# Patient Record
Sex: Male | Born: 1955 | Race: White | Hispanic: No | Smoking: Former smoker
Health system: Southern US, Community
[De-identification: ages and names within clinical notes are randomized; demographics above are authoritative.]

## PROBLEM LIST (undated history)

## (undated) DIAGNOSIS — T884XXA Failed or difficult intubation, initial encounter: Secondary | ICD-10-CM

---

## 2012-09-30 ENCOUNTER — Emergency Department: Payer: Self-pay | Admitting: Emergency Medicine

## 2012-10-01 ENCOUNTER — Emergency Department: Payer: Self-pay | Admitting: Emergency Medicine

## 2014-05-10 ENCOUNTER — Inpatient Hospital Stay (HOSPITAL_COMMUNITY)
Admission: EM | Admit: 2014-05-10 | Discharge: 2014-05-19 | DRG: 853 | Disposition: A | Discharge status: Home Health | Payer: Medicaid Other | Attending: Internal Medicine | Admitting: Internal Medicine

## 2014-05-10 ENCOUNTER — Ambulatory Visit (HOSPITAL_COMMUNITY): Admit: 2014-05-10 | Payer: Self-pay | Admitting: Cardiovascular Disease

## 2014-05-10 ENCOUNTER — Inpatient Hospital Stay (HOSPITAL_COMMUNITY): Payer: Medicaid Other

## 2014-05-10 ENCOUNTER — Emergency Department (HOSPITAL_COMMUNITY): Payer: Medicaid Other

## 2014-05-10 ENCOUNTER — Encounter (HOSPITAL_COMMUNITY): Payer: Self-pay | Admitting: Emergency Medicine

## 2014-05-10 ENCOUNTER — Encounter (HOSPITAL_COMMUNITY): Payer: Self-pay

## 2014-05-10 ENCOUNTER — Emergency Department (HOSPITAL_COMMUNITY): Admit: 2014-05-10 | Payer: Self-pay | Admitting: Cardiology

## 2014-05-10 DIAGNOSIS — E46 Unspecified protein-calorie malnutrition: Secondary | ICD-10-CM | POA: Diagnosis present

## 2014-05-10 DIAGNOSIS — R001 Bradycardia, unspecified: Secondary | ICD-10-CM | POA: Diagnosis present

## 2014-05-10 DIAGNOSIS — A419 Sepsis, unspecified organism: Principal | ICD-10-CM

## 2014-05-10 DIAGNOSIS — R7989 Other specified abnormal findings of blood chemistry: Secondary | ICD-10-CM

## 2014-05-10 DIAGNOSIS — R911 Solitary pulmonary nodule: Secondary | ICD-10-CM | POA: Diagnosis present

## 2014-05-10 DIAGNOSIS — F419 Anxiety disorder, unspecified: Secondary | ICD-10-CM | POA: Diagnosis present

## 2014-05-10 DIAGNOSIS — T380X5A Adverse effect of glucocorticoids and synthetic analogues, initial encounter: Secondary | ICD-10-CM | POA: Diagnosis present

## 2014-05-10 DIAGNOSIS — J690 Pneumonitis due to inhalation of food and vomit: Secondary | ICD-10-CM | POA: Diagnosis present

## 2014-05-10 DIAGNOSIS — R6521 Severe sepsis with septic shock: Secondary | ICD-10-CM | POA: Diagnosis present

## 2014-05-10 DIAGNOSIS — G4733 Obstructive sleep apnea (adult) (pediatric): Secondary | ICD-10-CM | POA: Diagnosis present

## 2014-05-10 DIAGNOSIS — C329 Malignant neoplasm of larynx, unspecified: Secondary | ICD-10-CM

## 2014-05-10 DIAGNOSIS — R778 Other specified abnormalities of plasma proteins: Secondary | ICD-10-CM | POA: Diagnosis present

## 2014-05-10 DIAGNOSIS — R40244 Other coma, without documented Glasgow coma scale score, or with partial score reported, unspecified time: Secondary | ICD-10-CM

## 2014-05-10 DIAGNOSIS — C760 Malignant neoplasm of head, face and neck: Secondary | ICD-10-CM

## 2014-05-10 DIAGNOSIS — I10 Essential (primary) hypertension: Secondary | ICD-10-CM | POA: Diagnosis present

## 2014-05-10 DIAGNOSIS — I639 Cerebral infarction, unspecified: Secondary | ICD-10-CM | POA: Diagnosis present

## 2014-05-10 DIAGNOSIS — R4189 Other symptoms and signs involving cognitive functions and awareness: Secondary | ICD-10-CM

## 2014-05-10 DIAGNOSIS — F1290 Cannabis use, unspecified, uncomplicated: Secondary | ICD-10-CM | POA: Diagnosis present

## 2014-05-10 DIAGNOSIS — E872 Acidosis, unspecified: Secondary | ICD-10-CM

## 2014-05-10 DIAGNOSIS — R339 Retention of urine, unspecified: Secondary | ICD-10-CM | POA: Diagnosis present

## 2014-05-10 DIAGNOSIS — R490 Dysphonia: Secondary | ICD-10-CM | POA: Diagnosis present

## 2014-05-10 DIAGNOSIS — I471 Supraventricular tachycardia: Secondary | ICD-10-CM | POA: Diagnosis present

## 2014-05-10 DIAGNOSIS — J441 Chronic obstructive pulmonary disease with (acute) exacerbation: Secondary | ICD-10-CM | POA: Diagnosis present

## 2014-05-10 DIAGNOSIS — G931 Anoxic brain damage, not elsewhere classified: Secondary | ICD-10-CM | POA: Diagnosis present

## 2014-05-10 DIAGNOSIS — C4442 Squamous cell carcinoma of skin of scalp and neck: Secondary | ICD-10-CM | POA: Diagnosis present

## 2014-05-10 DIAGNOSIS — I480 Paroxysmal atrial fibrillation: Secondary | ICD-10-CM

## 2014-05-10 DIAGNOSIS — G9340 Encephalopathy, unspecified: Secondary | ICD-10-CM

## 2014-05-10 DIAGNOSIS — R451 Restlessness and agitation: Secondary | ICD-10-CM | POA: Diagnosis present

## 2014-05-10 DIAGNOSIS — N179 Acute kidney failure, unspecified: Secondary | ICD-10-CM | POA: Diagnosis present

## 2014-05-10 DIAGNOSIS — C321 Malignant neoplasm of supraglottis: Secondary | ICD-10-CM

## 2014-05-10 DIAGNOSIS — R1313 Dysphagia, pharyngeal phase: Secondary | ICD-10-CM | POA: Diagnosis present

## 2014-05-10 DIAGNOSIS — J9602 Acute respiratory failure with hypercapnia: Secondary | ICD-10-CM

## 2014-05-10 DIAGNOSIS — R4182 Altered mental status, unspecified: Secondary | ICD-10-CM | POA: Diagnosis present

## 2014-05-10 DIAGNOSIS — R739 Hyperglycemia, unspecified: Secondary | ICD-10-CM | POA: Diagnosis present

## 2014-05-10 DIAGNOSIS — F17210 Nicotine dependence, cigarettes, uncomplicated: Secondary | ICD-10-CM | POA: Diagnosis present

## 2014-05-10 DIAGNOSIS — I4891 Unspecified atrial fibrillation: Secondary | ICD-10-CM | POA: Diagnosis present

## 2014-05-10 DIAGNOSIS — I2510 Atherosclerotic heart disease of native coronary artery without angina pectoris: Secondary | ICD-10-CM | POA: Diagnosis present

## 2014-05-10 DIAGNOSIS — I248 Other forms of acute ischemic heart disease: Secondary | ICD-10-CM | POA: Diagnosis present

## 2014-05-10 DIAGNOSIS — R40243 Glasgow coma scale score 3-8, unspecified time: Secondary | ICD-10-CM

## 2014-05-10 DIAGNOSIS — J384 Edema of larynx: Secondary | ICD-10-CM

## 2014-05-10 DIAGNOSIS — T884XXA Failed or difficult intubation, initial encounter: Secondary | ICD-10-CM

## 2014-05-10 DIAGNOSIS — R55 Syncope and collapse: Secondary | ICD-10-CM

## 2014-05-10 DIAGNOSIS — J9600 Acute respiratory failure, unspecified whether with hypoxia or hypercapnia: Secondary | ICD-10-CM | POA: Diagnosis present

## 2014-05-10 HISTORY — DX: Failed or difficult intubation, initial encounter: T88.4XXA

## 2014-05-10 LAB — BASIC METABOLIC PANEL
ANION GAP: 14 (ref 5–15)
Anion gap: 18 — ABNORMAL HIGH (ref 5–15)
BUN: 31 mg/dL — AB (ref 6–23)
BUN: 28 mg/dL — ABNORMAL HIGH (ref 6–23)
CHLORIDE: 95 meq/L — AB (ref 96–112)
CO2: 30 mEq/L (ref 19–32)
CO2: 25 mEq/L (ref 19–32)
Calcium: 10 mg/dL (ref 8.4–10.5)
Calcium: 8.2 mg/dL — ABNORMAL LOW (ref 8.4–10.5)
Chloride: 108 mEq/L (ref 96–112)
Creatinine, Ser: 1.24 mg/dL (ref 0.50–1.35)
Creatinine, Ser: 1.07 mg/dL (ref 0.50–1.35)
GFR calc Af Amer: 73 mL/min — ABNORMAL LOW (ref >90)
GFR calc non Af Amer: 63 mL/min — ABNORMAL LOW (ref >90)
GFR, EST AFRICAN AMERICAN: 88 mL/min — AB (ref >90)
GFR, EST NON AFRICAN AMERICAN: 76 mL/min — AB (ref >90)
GLUCOSE: 190 mg/dL — AB (ref 70–99)
Glucose, Bld: 87 mg/dL (ref 70–99)
POTASSIUM: 4.7 meq/L (ref 3.7–5.3)
POTASSIUM: 3.6 meq/L — AB (ref 3.7–5.3)
SODIUM: 143 meq/L (ref 137–147)
SODIUM: 147 meq/L (ref 137–147)

## 2014-05-10 LAB — COMPREHENSIVE METABOLIC PANEL
ALT: 31 U/L (ref 0–53)
ANION GAP: 11 (ref 5–15)
AST: 64 U/L — ABNORMAL HIGH (ref 0–37)
Albumin: 2.5 g/dL — ABNORMAL LOW (ref 3.5–5.2)
Alkaline Phosphatase: 72 U/L (ref 39–117)
BILIRUBIN TOTAL: 0.3 mg/dL (ref 0.3–1.2)
BUN: 33 mg/dL — AB (ref 6–23)
CALCIUM: 7.8 mg/dL — AB (ref 8.4–10.5)
CHLORIDE: 106 meq/L (ref 96–112)
CO2: 28 mEq/L (ref 19–32)
CREATININE: 1.23 mg/dL (ref 0.50–1.35)
GFR calc Af Amer: 74 mL/min — ABNORMAL LOW (ref >90)
GFR, EST NON AFRICAN AMERICAN: 64 mL/min — AB (ref >90)
Glucose, Bld: 80 mg/dL (ref 70–99)
Potassium: 4.3 mEq/L (ref 3.7–5.3)
Sodium: 145 mEq/L (ref 137–147)
Total Protein: 5.9 g/dL — ABNORMAL LOW (ref 6.0–8.3)

## 2014-05-10 LAB — I-STAT ARTERIAL BLOOD GAS, ED
Acid-Base Excess: 1 mmol/L (ref 0.0–2.0)
Bicarbonate: 29.8 mEq/L — ABNORMAL HIGH (ref 20.0–24.0)
O2 SAT: 99 %
PO2 ART: 156 mmHg — AB (ref 80.0–100.0)
Patient temperature: 98.6
TCO2: 32 mmol/L (ref 0–100)
pCO2 arterial: 66.9 mmHg (ref 35.0–45.0)
pH, Arterial: 7.256 — ABNORMAL LOW (ref 7.350–7.450)

## 2014-05-10 LAB — TROPONIN I
TROPONIN I: 5.51 ng/mL — AB (ref <0.30)
Troponin I: 0.56 ng/mL (ref <0.30)
Troponin I: 2.41 ng/mL (ref <0.30)
Troponin I: 7.08 ng/mL (ref <0.30)

## 2014-05-10 LAB — BLOOD GAS, ARTERIAL
Acid-base deficit: 1.4 mmol/L (ref 0.0–2.0)
Bicarbonate: 27.4 mEq/L — ABNORMAL HIGH (ref 20.0–24.0)
DRAWN BY: 313941
FIO2: 1 %
LHR: 16 {breaths}/min
MECHVT: 490 mL
O2 Saturation: 99.5 %
PCO2 ART: 91.7 mmHg — AB (ref 35.0–45.0)
PEEP: 5 cmH2O
PO2 ART: 299 mmHg — AB (ref 80.0–100.0)
Patient temperature: 98.6
TCO2: 30.2 mmol/L (ref 0–100)
pH, Arterial: 7.103 — CL (ref 7.350–7.450)

## 2014-05-10 LAB — CBC WITH DIFFERENTIAL/PLATELET
BASOS ABS: 0 10*3/uL (ref 0.0–0.1)
Basophils Absolute: 0 10*3/uL (ref 0.0–0.1)
Basophils Relative: 0 % (ref 0–1)
Basophils Relative: 0 % (ref 0–1)
EOS ABS: 0.1 10*3/uL (ref 0.0–0.7)
EOS PCT: 0 % (ref 0–5)
Eosinophils Absolute: 0 10*3/uL (ref 0.0–0.7)
Eosinophils Relative: 1 % (ref 0–5)
HCT: 48.4 % (ref 39.0–52.0)
HEMATOCRIT: 38.1 % — AB (ref 39.0–52.0)
HEMOGLOBIN: 14.3 g/dL (ref 13.0–17.0)
HEMOGLOBIN: 11.2 g/dL — AB (ref 13.0–17.0)
LYMPHS PCT: 9 % — AB (ref 12–46)
Lymphocytes Relative: 2 % — ABNORMAL LOW (ref 12–46)
Lymphs Abs: 1.2 10*3/uL (ref 0.7–4.0)
Lymphs Abs: 0.4 10*3/uL — ABNORMAL LOW (ref 0.7–4.0)
MCH: 24.7 pg — AB (ref 26.0–34.0)
MCH: 24 pg — ABNORMAL LOW (ref 26.0–34.0)
MCHC: 29.5 g/dL — ABNORMAL LOW (ref 30.0–36.0)
MCHC: 29.4 g/dL — ABNORMAL LOW (ref 30.0–36.0)
MCV: 83.7 fL (ref 78.0–100.0)
MCV: 81.8 fL (ref 78.0–100.0)
MONO ABS: 1.3 10*3/uL — AB (ref 0.1–1.0)
MONOS PCT: 4 % (ref 3–12)
MONOS PCT: 9 % (ref 3–12)
Monocytes Absolute: 0.6 10*3/uL (ref 0.1–1.0)
NEUTROS ABS: 13.3 10*3/uL — AB (ref 1.7–7.7)
NEUTROS PCT: 86 % — AB (ref 43–77)
Neutro Abs: 11.2 10*3/uL — ABNORMAL HIGH (ref 1.7–7.7)
Neutrophils Relative %: 89 % — ABNORMAL HIGH (ref 43–77)
PLATELETS: 280 10*3/uL (ref 150–400)
Platelets: 183 10*3/uL (ref 150–400)
RBC: 5.78 MIL/uL (ref 4.22–5.81)
RBC: 4.66 MIL/uL (ref 4.22–5.81)
RDW: 18.1 % — ABNORMAL HIGH (ref 11.5–15.5)
RDW: 17.9 % — AB (ref 11.5–15.5)
WBC: 13 10*3/uL — AB (ref 4.0–10.5)
WBC: 14.9 10*3/uL — ABNORMAL HIGH (ref 4.0–10.5)

## 2014-05-10 LAB — MAGNESIUM
MAGNESIUM: 1.5 mg/dL (ref 1.5–2.5)
Magnesium: 1.5 mg/dL (ref 1.5–2.5)

## 2014-05-10 LAB — RAPID URINE DRUG SCREEN, HOSP PERFORMED
Amphetamines: NOT DETECTED
BENZODIAZEPINES: NOT DETECTED
Barbiturates: NOT DETECTED
Cocaine: NOT DETECTED
Opiates: NOT DETECTED
Tetrahydrocannabinol: POSITIVE — AB

## 2014-05-10 LAB — URINALYSIS, ROUTINE W REFLEX MICROSCOPIC
Glucose, UA: NEGATIVE mg/dL
Ketones, ur: 15 mg/dL — AB
LEUKOCYTES UA: NEGATIVE
Nitrite: NEGATIVE
PH: 6 (ref 5.0–8.0)
Protein, ur: 100 mg/dL — AB
SPECIFIC GRAVITY, URINE: 1.025 (ref 1.005–1.030)
UROBILINOGEN UA: 1 mg/dL (ref 0.0–1.0)

## 2014-05-10 LAB — CK TOTAL AND CKMB (NOT AT ARMC)
CK TOTAL: 686 U/L — AB (ref 7–232)
CK, MB: 19.6 ng/mL (ref 0.3–4.0)
Relative Index: 2.9 — ABNORMAL HIGH (ref 0.0–2.5)

## 2014-05-10 LAB — GLUCOSE, CAPILLARY
GLUCOSE-CAPILLARY: 73 mg/dL (ref 70–99)
GLUCOSE-CAPILLARY: 89 mg/dL (ref 70–99)
Glucose-Capillary: 84 mg/dL (ref 70–99)

## 2014-05-10 LAB — URINE MICROSCOPIC-ADD ON

## 2014-05-10 LAB — HEMOGLOBIN A1C
Hgb A1c MFr Bld: 5.8 % — ABNORMAL HIGH (ref <5.7)
Mean Plasma Glucose: 120 mg/dL — ABNORMAL HIGH (ref <117)

## 2014-05-10 LAB — CORTISOL: Cortisol, Plasma: 30.1 ug/dL

## 2014-05-10 LAB — SALICYLATE LEVEL: Salicylate Lvl: 10.9 mg/dL (ref 2.8–20.0)

## 2014-05-10 LAB — ACETAMINOPHEN LEVEL

## 2014-05-10 LAB — MRSA PCR SCREENING: MRSA by PCR: NEGATIVE

## 2014-05-10 LAB — AMMONIA: Ammonia: 25 umol/L (ref 11–60)

## 2014-05-10 LAB — APTT: APTT: 27 s (ref 24–37)

## 2014-05-10 LAB — PROTIME-INR
INR: 1.37 (ref 0.00–1.49)
PROTHROMBIN TIME: 16.9 s — AB (ref 11.6–15.2)

## 2014-05-10 LAB — LACTIC ACID, PLASMA: LACTIC ACID, VENOUS: 5.6 mmol/L — AB (ref 0.5–2.2)

## 2014-05-10 LAB — PRO B NATRIURETIC PEPTIDE: Pro B Natriuretic peptide (BNP): 1793 pg/mL — ABNORMAL HIGH (ref 0–125)

## 2014-05-10 LAB — ETHANOL: Alcohol, Ethyl (B): <11 mg/dL (ref 0–11)

## 2014-05-10 LAB — PHOSPHORUS: Phosphorus: 2.1 mg/dL — ABNORMAL LOW (ref 2.3–4.6)

## 2014-05-10 LAB — POTASSIUM: Potassium: 4.1 mEq/L (ref 3.7–5.3)

## 2014-05-10 LAB — CK: CK TOTAL: 371 U/L — AB (ref 7–232)

## 2014-05-10 SURGERY — LEFT HEART CATHETERIZATION WITH CORONARY ANGIOGRAM
Anesthesia: LOCAL

## 2014-05-10 MED ORDER — ROCURONIUM BROMIDE 50 MG/5ML IV SOLN
INTRAVENOUS | Status: AC
  Filled 2014-05-10: qty 2

## 2014-05-10 MED ORDER — ETOMIDATE 2 MG/ML IV SOLN
INTRAVENOUS | Status: AC
  Administered 2014-05-10: 30 mg
  Filled 2014-05-10: qty 20

## 2014-05-10 MED ORDER — INSULIN ASPART 100 UNIT/ML ~~LOC~~ SOLN
0.0000 [IU] | SUBCUTANEOUS | Status: DC
  Administered 2014-05-11 – 2014-05-18 (×4): 2 [IU] via SUBCUTANEOUS

## 2014-05-10 MED ORDER — SODIUM CHLORIDE 0.9 % IV BOLUS (SEPSIS)
2000.0000 mL | Freq: Once | INTRAVENOUS | Status: AC
  Administered 2014-05-10: 2000 mL via INTRAVENOUS

## 2014-05-10 MED ORDER — HYDROCORTISONE NA SUCCINATE PF 100 MG IJ SOLR
50.0000 mg | Freq: Four times a day (QID) | INTRAMUSCULAR | Status: DC
  Administered 2014-05-10 – 2014-05-11 (×4): 50 mg via INTRAVENOUS
  Filled 2014-05-10 (×8): qty 1

## 2014-05-10 MED ORDER — VANCOMYCIN HCL 10 G IV SOLR
1500.0000 mg | Freq: Once | INTRAVENOUS | Status: AC
  Administered 2014-05-10: 1500 mg via INTRAVENOUS
  Filled 2014-05-10: qty 1500

## 2014-05-10 MED ORDER — LEVETIRACETAM IN NACL 500 MG/100ML IV SOLN
500.0000 mg | Freq: Two times a day (BID) | INTRAVENOUS | Status: DC
  Administered 2014-05-11 – 2014-05-12 (×4): 500 mg via INTRAVENOUS
  Filled 2014-05-10 (×6): qty 100

## 2014-05-10 MED ORDER — NOREPINEPHRINE BITARTRATE 1 MG/ML IV SOLN: 2.0000 ug/min | INTRAVENOUS | Status: DC

## 2014-05-10 MED ORDER — HEPARIN SODIUM (PORCINE) 5000 UNIT/ML IJ SOLN
5000.0000 [IU] | Freq: Three times a day (TID) | INTRAMUSCULAR | Status: DC
  Administered 2014-05-10 – 2014-05-15 (×10): 5000 [IU] via SUBCUTANEOUS
  Filled 2014-05-10 (×23): qty 1

## 2014-05-10 MED ORDER — IOHEXOL 350 MG/ML SOLN
50.0000 mL | Freq: Once | INTRAVENOUS | Status: AC | PRN
  Administered 2014-05-10: 50 mL via INTRAVENOUS

## 2014-05-10 MED ORDER — PIPERACILLIN-TAZOBACTAM 3.375 G IVPB
3.3750 g | Freq: Three times a day (TID) | INTRAVENOUS | Status: DC
  Administered 2014-05-10 – 2014-05-12 (×6): 3.375 g via INTRAVENOUS
  Filled 2014-05-10 (×7): qty 50

## 2014-05-10 MED ORDER — LIDOCAINE HCL (CARDIAC) 20 MG/ML IV SOLN
INTRAVENOUS | Status: AC
  Filled 2014-05-10: qty 5

## 2014-05-10 MED ORDER — PANTOPRAZOLE SODIUM 40 MG IV SOLR
40.0000 mg | INTRAVENOUS | Status: DC
  Administered 2014-05-11 – 2014-05-12 (×2): 40 mg via INTRAVENOUS
  Filled 2014-05-10 (×4): qty 40

## 2014-05-10 MED ORDER — LORAZEPAM 2 MG/ML IJ SOLN
2.0000 mg | Freq: Once | INTRAMUSCULAR | Status: AC
  Administered 2014-05-10: 2 mg via INTRAVENOUS
  Filled 2014-05-10: qty 1

## 2014-05-10 MED ORDER — PIPERACILLIN-TAZOBACTAM 3.375 G IVPB 30 MIN: 3.3750 g | Freq: Once | INTRAVENOUS | Status: DC

## 2014-05-10 MED ORDER — INFLUENZA VAC SPLIT QUAD 0.5 ML IM SUSY
0.5000 mL | PREFILLED_SYRINGE | INTRAMUSCULAR | Status: DC
  Filled 2014-05-10 (×2): qty 0.5

## 2014-05-10 MED ORDER — SODIUM CHLORIDE 0.9 % IV SOLN: INTRAVENOUS | Status: DC

## 2014-05-10 MED ORDER — CETYLPYRIDINIUM CHLORIDE 0.05 % MT LIQD
7.0000 mL | Freq: Four times a day (QID) | OROMUCOSAL | Status: DC
  Administered 2014-05-10 – 2014-05-19 (×25): 7 mL via OROMUCOSAL

## 2014-05-10 MED ORDER — CHLORHEXIDINE GLUCONATE 0.12 % MT SOLN
15.0000 mL | Freq: Two times a day (BID) | OROMUCOSAL | Status: DC
  Administered 2014-05-10 – 2014-05-19 (×18): 15 mL via OROMUCOSAL
  Filled 2014-05-10 (×20): qty 15

## 2014-05-10 MED ORDER — SODIUM CHLORIDE 0.9 % IV SOLN
50.0000 ug/h | INTRAVENOUS | Status: DC
  Administered 2014-05-10: 50 ug/h via INTRAVENOUS
  Filled 2014-05-10: qty 50

## 2014-05-10 MED ORDER — IPRATROPIUM-ALBUTEROL 0.5-2.5 (3) MG/3ML IN SOLN
3.0000 mL | RESPIRATORY_TRACT | Status: DC
  Administered 2014-05-10 – 2014-05-12 (×11): 3 mL via RESPIRATORY_TRACT
  Filled 2014-05-10 (×12): qty 3

## 2014-05-10 MED ORDER — VANCOMYCIN HCL IN DEXTROSE 1-5 GM/200ML-% IV SOLN
1000.0000 mg | Freq: Two times a day (BID) | INTRAVENOUS | Status: DC
  Administered 2014-05-11 – 2014-05-12 (×4): 1000 mg via INTRAVENOUS
  Filled 2014-05-10 (×4): qty 200

## 2014-05-10 MED ORDER — SUCCINYLCHOLINE CHLORIDE 20 MG/ML IJ SOLN
INTRAMUSCULAR | Status: AC
  Administered 2014-05-10: 120 mg
  Filled 2014-05-10: qty 1

## 2014-05-10 MED ORDER — IPRATROPIUM-ALBUTEROL 20-100 MCG/ACT IN AERS: 6.0000 | INHALATION_SPRAY | RESPIRATORY_TRACT | Status: DC

## 2014-05-10 MED ORDER — PIPERACILLIN-TAZOBACTAM IN DEX 2-0.25 GM/50ML IV SOLN: 2.2500 g | Freq: Once | INTRAVENOUS | Status: DC

## 2014-05-10 MED ORDER — SODIUM CHLORIDE 0.9 % IV BOLUS (SEPSIS)
1000.0000 mL | Freq: Once | INTRAVENOUS | Status: AC
  Administered 2014-05-10: 1000 mL via INTRAVENOUS

## 2014-05-10 MED ORDER — NOREPINEPHRINE BITARTRATE 1 MG/ML IV SOLN
2.0000 ug/min | INTRAVENOUS | Status: DC
  Filled 2014-05-10: qty 4

## 2014-05-10 MED ORDER — IOHEXOL 350 MG/ML SOLN
100.0000 mL | Freq: Once | INTRAVENOUS | Status: AC | PRN
  Administered 2014-05-10: 80 mL via INTRAVENOUS

## 2014-05-10 MED ORDER — LEVETIRACETAM IN NACL 1000 MG/100ML IV SOLN
1000.0000 mg | Freq: Once | INTRAVENOUS | Status: AC
  Administered 2014-05-10: 1000 mg via INTRAVENOUS
  Filled 2014-05-10: qty 100

## 2014-05-10 NOTE — ED Notes (Signed)
Pt's wallet given to brother, Antionette Poles

## 2014-05-10 NOTE — Procedures (Signed)
Central Venous Catheter Insertion Procedure Note Donald Hughes [id number] April 03, 1956  Procedure: Insertion of Central Venous Catheter Indications: Assessment of intravascular volume and Drug and/or fluid administration  Procedure Details Consent: Unable to obtain consent because of emergent medical necessity. Time Out: Verified patient identification, verified procedure, site/side was marked, verified correct patient position, special equipment/implants available, medications/allergies/relevent history reviewed, required imaging and test results available.  Performed  Maximum sterile technique was used including antiseptics, cap, gloves, gown, hand hygiene, mask and sheet. Skin prep: Chlorhexidine; local anesthetic administered A antimicrobial bonded/coated triple lumen catheter was placed in the left internal jugular vein using the Seldinger technique.  Evaluation Blood flow good Complications: No apparent complications Patient did tolerate procedure well. Chest X-ray ordered to verify placement.  CXR: normal.  U/S used in placement.  YACOUB,WESAM 05/10/2014, 11:09 AM

## 2014-05-10 NOTE — Consult Note (Addendum)
CARDIOLOGY CONSULT NOTE   Patient ID: Donald Hughes MRN: [id number] DOB/AGE: 02-15-1956 58 y.o.  Admit Date: 05/10/2014 Primary Physician: No primary provider on file. Primary Cardiologist    Rodney Langton Reason for Consultation:   Clinical Summary Donald Hughes is a 58 y.o.male. He appeared to be stable when seen by roommate approximately 1 AM. At 6 AM he was found unresponsive. When EMS arrived he had bradycardia and decreased respirations. His airway was placed. As he arrived in the emergency room he had rapid atrial fibrillation. The EKGs were reviewed immediately by the interventional team (Dr. Burt Knack). He felt that the presentation was not consistent with an acute ST elevation MI. There was an initial EKG suggesting very slight ST elevation. EKGs in the emergency room first showed rapid atrial fibrillation with diffuse ST depression. He then converted to sinus rhythm. A followup EKG in sinus rhythm shows mild diffuse ST depression. In the emergency room the patient is assessed by the critical care team and by neurology. He was noted to be acidemic and markedly hypercarbic upon arrival. Etiology remains unclear.  There are no prior records about this patient. There is no information about any possible prior cardiac history.   Allergies not on file  Medications Scheduled Medications: . heparin subcutaneous  5,000 Units Subcutaneous 3 times per day  . hydrocortisone sodium succinate  50 mg Intravenous Q6H  . insulin aspart  0-15 Units Subcutaneous 6 times per day  . ipratropium-albuterol  3 mL Nebulization Q4H  . lidocaine (cardiac) 100 mg/11ml      . pantoprazole (PROTONIX) IV  40 mg Intravenous Q24H  . rocuronium         Infusions: . sodium chloride    . fentaNYL infusion INTRAVENOUS 50 mcg/hr (05/10/14 0838)  . norepinephrine (LEVOPHED) Adult infusion    . piperacillin-tazobactam    . piperacillin-tazobactam (ZOSYN)  IV    . vancomycin 1,500 mg (05/10/14 1046)  . [START  ON 05/11/2014] vancomycin       PRN Medications:  iohexol   No past medical history on file.  No past surgical history on file.  No family history on file.  Social History Donald Hughes has no tobacco history on file. Donald Hughes has no alcohol history on file.  Family history:  Family history cannot be obtained as the patient is unresponsive.  Review of Systems     Review of systems cannot be obtained as the patient is unresponsive.  Physical Examination Blood pressure 91/54, pulse 96, temperature 98.4 F (36.9 C), temperature source Core (Comment), resp. rate 12, height 5\' 5"  (1.651 m), SpO2 100.00%. No intake or output data in the 24 hours ending 05/10/14 1133  Patient is seen in the bed in the emergency room. He is intubated. Currently he is shaking. It is not yet clear if this is seizure activity or not. He is not responsive. He is sedated. Head is atraumatic. Lungs reveal scattered rhonchi. Cardiac exam reveals S1 and S2. Abdomen is soft. There is no significant peripheral edema.  Prior Cardiac Testing/Procedures  Lab Results  Basic Metabolic Panel:  Recent Labs Lab 05/10/14 0809  NA 143  K 4.7  CL 95*  CO2 30  GLUCOSE 190*  BUN 31*  CREATININE 1.24  CALCIUM 10.0    Liver Function Tests: No results found for this basename: AST, ALT, ALKPHOS, BILITOT, PROT, ALBUMIN,  in the last 168 hours  CBC:  Recent Labs Lab 05/10/14 0809  WBC  13.0*  NEUTROABS 11.2*  HGB 14.3  HCT 48.4  MCV 83.7  PLT 280    Cardiac Enzymes:  Recent Labs Lab 05/10/14 0937  CKTOTAL 371*  TROPONINI 0.56*    BNP: No components found with this basename: POCBNP,    Radiology: Ct Head Wo Contrast  05/10/2014   CLINICAL DATA:  Patient unresponsive.  EXAM: CT HEAD WITHOUT CONTRAST  TECHNIQUE: Contiguous axial images were obtained from the base of the skull through the vertex without intravenous contrast.  COMPARISON:  None.  FINDINGS: There is no evidence of acute  intracranial abnormality including infarct, hemorrhage, mass lesion, mass effect, midline shift or abnormal extra-axial fluid collection. Mucous retention cysts or polyps are seen in the maxillary sinuses bilaterally. Mastoid air cells and middle ears are clear. The calvarium is intact.  IMPRESSION: No acute intracranial abnormality.  Mucous retention cysts or polyps in the maxillary sinuses.   Electronically Signed   By: Inge Rise M.D.   On: 05/10/2014 09:04   Dg Chest Port 1 View  05/10/2014   CLINICAL DATA:  58 year old male found unresponsive in home by roommate at 0630 hr, bradycardia on EMS arrival, intubated. Initial encounter.  EXAM: PORTABLE CHEST - 1 VIEW  COMPARISON:  None.  FINDINGS: Portable AP supine view at 0818 hr. Endotracheal tube in good position at the level the clavicles. Enteric tube courses to the abdomen, side hole the level of the gastric body.  Mildly low lung volumes. Normal cardiac size and mediastinal contours. Visualized tracheal air column is within normal limits. No pneumothorax, pulmonary edema, pleural effusion or confluent pulmonary opacity.  IMPRESSION: 1. ET tube and enteric tube in good position. 2. Mildly low lung volumes.  No acute cardiopulmonary abnormality.   Electronically Signed   By: Lars Pinks M.D.   On: 05/10/2014 08:34   Dg Chest Port 1v Same Day  05/10/2014   CLINICAL DATA:  Central catheter placement ; hypoxia  EXAM: PORTABLE CHEST - 1 VIEW SAME DAY  COMPARISON:  Study obtained earlier in the day  FINDINGS: Central catheter tip is in the left innominate vein. Endotracheal tube tip is 4.1 cm above the carina. Nasogastric tube tip and side port are in the stomach. No pneumothorax. There is no edema or consolidation. Heart is upper normal in size with pulmonary vascularity within normal limits. Thoracolumbar levoscoliosis is stable.  IMPRESSION: New central catheter tip is in the left innominate vein. Tube positions as described. No pneumothorax. No lung  edema or consolidation.   Electronically Signed   By: Lowella Grip M.D.   On: 05/10/2014 10:48    ECG:  The EKG from the field is not available to me. EKG when first in the emergency room showed rapid atrial fibrillation with marked diffuse ST depression. Followup EKG shows normal sinus rhythm with mild ST depression.  Telemetry:   Currently in the emergency room he has normal sinus rhythm.   Impression and Recommendations    Respiratory failure, acute   Altered mental status   Septic shock   Aspiration pneumonia      At this point the etiology of the patient's presentation these issues is not clear. It does not appear that his primary presentation is cardiac in origin.    Atrial fibrillation     The patient had rapid atrial fibrillation in the emergency room. He has converted to sinus rhythm spontaneously. He had limited atrial fibrillation associated with his severe presentation. I would not plan on anticoagulation for this issue  over time unless further atrial fibrillation is seen in the future.    Elevated troponin    The first troponin is abnormal. This is probably related to the severity of his presentation with acidemia.  We will follow the patient from the cardiac viewpoint as he is managed by the other teams. Two-dimensional echo will help [place] assess his LV function. Over time we will decide if further cardiac evaluation is appropriate.  Daryel November, MD 05/10/2014, 11:33 AM

## 2014-05-10 NOTE — ED Notes (Signed)
Returned from CT Dr. Aline Brochure at bedside aware of current VS and status increased IV fluids to open x 1 liter.

## 2014-05-10 NOTE — Progress Notes (Signed)
Interventional Cardiology Note:  This is a 58 year old male brought in by [facility]. The patient was initially encoded as a code STEMI. His initial EKG showed ST changes with about 1 mm of ST elevation in one lead (aVF) but was non-diagnostic of STEMI. A second EKG showed atrial fibrillation with RVR with diffuse ST depression. The patient was found down this morning. He has been unresponsive. He was last seen at 0100. A clean airway was placed in the field. I have evaluated the patient in the emergency department and I do not feel that his EKG or clinical syndrome is consistent with acute ST segment elevation MI. He will be worked up and treated in the emergency department.  Sherren Mocha MD 05/10/2014 8:18 AM

## 2014-05-10 NOTE — Progress Notes (Signed)
CRITICAL VALUE ALERT  Critical value received:  CKMB 19.6    Date of notification:  05/10/2014  Time of notification:  2322  Critical value read back:Yes.    Nurse who received alert:  K Carren Rang  MD notified (1st page):  Dr. Elsworth Soho  Time of first page:  2323  MD notified (2nd page):  Time of second page:  Responding MD:   Time MD responded:

## 2014-05-10 NOTE — ED Provider Notes (Signed)
CSN: [id number]     Arrival date & time 05/10/14  4259 History   First MD Initiated Contact with Patient 05/10/14 0740     Chief Complaint  Patient presents with  . Altered Mental Status   Patient is a 58 y.o. male presenting with altered mental status. The history is provided by the EMS personnel.  Altered Mental Status Presenting symptoms: unresponsiveness   Severity:  Severe Most recent episode:  Today Episode history:  Single Duration: 1.5 hours. Timing:  Constant Progression:  Unchanged Chronicity:  New Context: not drug use and not head injury   Associated symptoms comment:  Unknown  Pt is a 58 yo WM with an unknown PMH who presents by EMS after being found down at 7AM by his roommate. Last normal at 1AM. Pt had been complaining of SOB x 3 days. Patient unresponsive with GCS 3 by EMS on arrival. FSG 200s. Initially bradycardic in 30s. King Airway secured and HR increased to AF w RVR 130s.  Patient hypotensive but last pressure 563 systolic.   Family arrived to provide ancillary history. Patient has been taking BC powders up to 30/day for the last several years. Over the last 8 months, he has been having dysphagia and a hoarse/raspy voice. No h/o asthma, seizures or IVDU. Code status unknown but brother suspects he would want to be full code.   No past medical history on file. No past surgical history on file. No family history on file. History  Substance Use Topics  . Smoking status: Not on file  . Smokeless tobacco: Not on file  . Alcohol Use: Not on file    Review of Systems  Unable to perform ROS: Intubated   Allergies  Review of patient's allergies indicates not on file.  Home Medications   Prior to Admission medications   Not on File   BP 73/49  Pulse 79  Temp(Src) 98.4 F (36.9 C) (Core (Comment))  Resp 18  Ht 5\' 5"  (1.651 m)  SpO2 98% Physical Exam  Constitutional:  Obese, disheveled, unresponsive, intubated, no spontaneous movements  HENT:  Head:  Normocephalic and atraumatic.  Mouth/Throat: Oropharynx is clear and moist.  Poor dentition  Eyes:  Pupils 36mm and sluggish  Neck: Neck supple. No JVD present.  Cardiovascular: An irregular rhythm present. Tachycardia present.   Pulmonary/Chest:  Diminished bilateral breath sounds, bilateral expiratory wheezes, prolonged expiratory phase  Abdominal: Soft. There is no rebound and no guarding.  obese  Neurological: GCS eye subscore is 1. GCS verbal subscore is 1. GCS motor subscore is 1.  upgoing toes bilaterally  Skin: Skin is warm and dry. No rash noted.    ED Course  INTUBATION Date/Time: 05/10/2014 10:26 AM Performed by: Gustavus Bryant Authorized by: Gustavus Bryant Consent: The procedure was performed in an emergent situation. Indications: airway protection Intubation method: video-assisted Patient status: paralyzed (RSI) Preoxygenation: BVM Sedatives: etomidate Paralytic: succinylcholine Laryngoscope size: Mac 3 Tube size: 6.5 mm Tube type: cuffed Number of attempts: 3 Ventilation between attempts: BVM Cricoid pressure: yes Cords visualized: yes Post-procedure assessment: chest rise,  ETCO2 monitor and CO2 detector Breath sounds: equal and absent over the epigastrium Cuff inflated: yes ETT to lip: 22 cm Tube secured with: ETT holder Chest x-ray interpreted by me and radiologist. Chest x-ray findings: endotracheal tube in appropriate position Patient tolerance: Patient tolerated the procedure well with no immediate complications.   (including critical care time) Labs Review Labs Reviewed  URINALYSIS, ROUTINE W REFLEX MICROSCOPIC - Abnormal; Notable for the  following:    Color, Urine AMBER (*)    Hgb urine dipstick TRACE (*)    Bilirubin Urine SMALL (*)    Ketones, ur 15 (*)    Protein, ur 100 (*)    All other components within normal limits  CBC WITH DIFFERENTIAL - Abnormal; Notable for the following:    WBC 13.0 (*)    MCH 24.7 (*)    MCHC 29.5 (*)    RDW  18.1 (*)    Neutrophils Relative % 86 (*)    Neutro Abs 11.2 (*)    Lymphocytes Relative 9 (*)    All other components within normal limits  BASIC METABOLIC PANEL - Abnormal; Notable for the following:    Chloride 95 (*)    Glucose, Bld 190 (*)    BUN 31 (*)    GFR calc non Af Amer 63 (*)    GFR calc Af Amer 73 (*)    Anion gap 18 (*)    All other components within normal limits  LACTIC ACID, PLASMA - Abnormal; Notable for the following:    Lactic Acid, Venous 5.6 (*)    All other components within normal limits  URINE RAPID DRUG SCREEN (HOSP PERFORMED) - Abnormal; Notable for the following:    Tetrahydrocannabinol POSITIVE (*)    All other components within normal limits  BLOOD GAS, ARTERIAL - Abnormal; Notable for the following:    pH, Arterial 7.103 (*)    pCO2 arterial 91.7 (*)    pO2, Arterial 299.0 (*)    Bicarbonate 27.4 (*)    All other components within normal limits  CK - Abnormal; Notable for the following:    Total CK 371 (*)    All other components within normal limits  URINE MICROSCOPIC-ADD ON - Abnormal; Notable for the following:    Casts HYALINE CASTS (*)    All other components within normal limits  TROPONIN I - Abnormal; Notable for the following:    Troponin I 0.56 (*)    All other components within normal limits  I-STAT ARTERIAL BLOOD GAS, ED - Abnormal; Notable for the following:    pH, Arterial 7.256 (*)    pCO2 arterial 66.9 (*)    pO2, Arterial 156.0 (*)    Bicarbonate 29.8 (*)    All other components within normal limits    Imaging Review Ct Head Wo Contrast  05/10/2014   CLINICAL DATA:  Patient unresponsive.  EXAM: CT HEAD WITHOUT CONTRAST  TECHNIQUE: Contiguous axial images were obtained from the base of the skull through the vertex without intravenous contrast.  COMPARISON:  None.  FINDINGS: There is no evidence of acute intracranial abnormality including infarct, hemorrhage, mass lesion, mass effect, midline shift or abnormal extra-axial fluid  collection. Mucous retention cysts or polyps are seen in the maxillary sinuses bilaterally. Mastoid air cells and middle ears are clear. The calvarium is intact.  IMPRESSION: No acute intracranial abnormality.  Mucous retention cysts or polyps in the maxillary sinuses.   Electronically Signed   By: Inge Rise M.D.   On: 05/10/2014 09:04   Dg Chest Port 1 View  05/10/2014   CLINICAL DATA:  58 year old male found unresponsive in home by roommate at 0630 hr, bradycardia on EMS arrival, intubated. Initial encounter.  EXAM: PORTABLE CHEST - 1 VIEW  COMPARISON:  None.  FINDINGS: Portable AP supine view at 0818 hr. Endotracheal tube in good position at the level the clavicles. Enteric tube courses to the abdomen, side hole the level  of the gastric body.  Mildly low lung volumes. Normal cardiac size and mediastinal contours. Visualized tracheal air column is within normal limits. No pneumothorax, pulmonary edema, pleural effusion or confluent pulmonary opacity.  IMPRESSION: 1. ET tube and enteric tube in good position. 2. Mildly low lung volumes.  No acute cardiopulmonary abnormality.   Electronically Signed   By: Lars Pinks M.D.   On: 05/10/2014 08:34     EKG Interpretation   Date/Time:  Thursday May 10 2014 08:07:51 EDT Ventricular Rate:  103 PR Interval:  145 QRS Duration: 81 QT Interval:  347 QTC Calculation: 454 R Axis:   -47 Text Interpretation:  Sinus tachycardia LAD, consider left anterior  fascicular block Borderline ST depression, diffuse leads Baseline wander  in lead(s) V5 Confirmed by HARRISON  MD, FORREST ([id number]) on 05/10/2014  8:10:54 AM      MDM   Final diagnoses:  Unresponsive  Elevated troponin  Laryngeal edema  Lactic acidosis  Acute respiratory failure with hypercapnia    58 yo M with unknown PMH arrived by EMS intubated with king airway and GCS 3. Poor air movemennt on lung auscultation with bilateral expiratory wheeze. Pupils 93mm and sluggish. upgoing toes  bilaterally. RSI with etom and succ.  First attempt with DL unsuccessful as cords were not visualized and airway was grossly edematous. Glidescope visualized no normal anatomy, possibly edematous cords. At 6.5 ETT was passed into the trachea. Placement confirmed by fogging of tube, chest rise, breath sounds. CXR obtained. ABG 7.1/91/299/27. Labs significant for leukocytosis, UDS +THC, lactate 5.6 . CT head neg for acute bleed. Troponin 0.56. Cards consulted. ICU team at bedside. Patient hypotensive. ICU team inserted triple lumen CVC. IVFs given. Blood cx drawn. Patient started on vanc/zosyn from broad coverage. Vent settings adjusted for acidosis. Repeat gas 7.25/66.  CK 370. Patient transported to ICU.   Case discussed with Dr. Aline Brochure.  Gustavus Bryant, MD   Gustavus Bryant, MD 05/10/14 [phone]

## 2014-05-10 NOTE — ED Notes (Signed)
Pt's brother and sister-in-law to bedside

## 2014-05-10 NOTE — Consult Note (Signed)
NEURO HOSPITALIST CONSULT NOTE    Reason for Consult: AMS  HPI:                                                                                                                                          Donald Hughes is an 58 y.o. male with unknown PMH, smoker and chronic user of [place] who was last seen normal at 1 AM on the morning of 10/1 then roommate found him at 6 AM unresponsive. Some ?of SOB for a few days prior but overall no known hx. The roommate is not bedside and not answering her phone. On EMS arrival pt was unresponsive, bradycardic, intubated with [facility] airway. On arrival to ER was in AFib with RVR with ?ST depression, king airway changed to 6.5 ETT with difficult airway, mod swelling. Currently patient is intubated and on no sedation. Neurology was called for further evaluation.   All of above information gathered from chart as patient is intubated and non responsive.   PMH: None known, does not routinely see doctors  No past surgical history on file.  PMH: Unable to assess secondary to patient's altered mental status.     Social History:  has no tobacco, alcohol, and drug history on file. Unable to assess secondary to patient's altered mental status.    Allergies not on file  MEDICATIONS:                                                                                                                     Current Facility-Administered Medications  Medication Dose Route Frequency Provider Last Rate Last Dose  . 0.9 %  sodium chloride infusion   Intravenous Continuous Rush Farmer, MD      . fentaNYL (SUBLIMAZE) 2,500 mcg in sodium chloride 0.9 % 250 mL (10 mcg/mL) infusion  50 mcg/hr Intravenous Continuous Gustavus Bryant, MD 5 mL/hr at 05/10/14 0838 50 mcg/hr at 05/10/14 0838  . heparin injection 5,000 Units  5,000 Units Subcutaneous 3 times per day Rush Farmer, MD      . hydrocortisone sodium succinate (SOLU-CORTEF) 100 MG injection 50 mg   50 mg Intravenous Q6H Rush Farmer, MD      . insulin aspart (novoLOG) injection 0-15 Units  0-15 Units Subcutaneous  6 times per day Marijean Heath, NP      . ipratropium-albuterol (DUONEB) 0.5-2.5 (3) MG/3ML nebulizer solution 3 mL  3 mL Nebulization Q4H Rush Farmer, MD      . lidocaine (cardiac) 100 mg/61ml (XYLOCAINE) 20 MG/ML injection 2%           . norepinephrine (LEVOPHED) 4 mg in dextrose 5 % 250 mL infusion  2-50 mcg/min Intravenous Continuous Rush Farmer, MD      . norepinephrine (LEVOPHED) 4 mg in dextrose 5 % 250 mL infusion  2-50 mcg/min Intravenous Continuous Rush Farmer, MD      . pantoprazole (PROTONIX) injection 40 mg  40 mg Intravenous Q24H Rush Farmer, MD      . piperacillin-tazobactam (ZOSYN) IVPB 3.375 g  3.375 g Intravenous Once Pamella Pert, MD      . piperacillin-tazobactam (ZOSYN) IVPB 3.375 g  3.375 g Intravenous [address] [place], [place]      . rocuronium (ZEMURON) 50 MG/5ML injection           . vancomycin (VANCOCIN) 1,500 mg in sodium chloride 0.9 % 500 mL IVPB  1,500 mg Intravenous Once Gustavus Bryant, MD 250 mL/hr at 05/10/14 1046 1,500 mg at 05/10/14 1046  . [START ON 05/11/2014] vancomycin (VANCOCIN) IVPB 1000 mg/200 mL premix  1,000 mg Intravenous Q12H Thuy Job Founds, [facility]       No current outpatient prescriptions on file.      ROS:                                                                                                                                       History obtained from unobtainable from patient due to mental status    Blood pressure 73/49, pulse 79, temperature 98.4 F (36.9 C), temperature source Core (Comment), resp. rate 24, height 5\' 5"  (1.651 m), SpO2 100.00%.   Neurologic Examination:                                                                                                      Mental Status: Breathing over the vent. Patient does not respond to verbal stimuli.  Does not respond to deep sternal rub.  Does not  follow commands.  No verbalizations noted.  Cranial Nerves: II: patient does not respond confrontation bilaterally, pupils right 2 mm, left 2 mm,and reactive bilaterally III,IV,VI: doll's response present bilaterally.  V,VII: corneal reflex present bilaterally  VIII: patient does not respond to verbal stimuli  IX,X: gag reflex reduced, XI: trapezius strength unable to test bilaterally XII: tongue strength unable to test Motor: Extremities flaccid throughout. Stimulus induced myoclonic activity which is increased by tactile and auditory stimulation. Question of some extensor posturing as well.  Sensory: Does not respond to noxious stimuli in any extremity other than triple reflex of bilateral LE Deep Tendon Reflexes:  Absent throughout. Plantars: upgoing bilaterally Cerebellar: Unable to perform    Lab Results: Basic Metabolic Panel:  Recent Labs Lab 05/10/14 0809  NA 143  K 4.7  CL 95*  CO2 30  GLUCOSE 190*  BUN 31*  CREATININE 1.24  CALCIUM 10.0    Liver Function Tests: No results found for this basename: AST, ALT, ALKPHOS, BILITOT, PROT, ALBUMIN,  in the last 168 hours No results found for this basename: LIPASE, AMYLASE,  in the last 168 hours No results found for this basename: AMMONIA,  in the last 168 hours  CBC:  Recent Labs Lab 05/10/14 0809  WBC 13.0*  NEUTROABS 11.2*  HGB 14.3  HCT 48.4  MCV 83.7  PLT 280    Cardiac Enzymes:  Recent Labs Lab 05/10/14 0937  CKTOTAL 371*  TROPONINI 0.56*    Lipid Panel: No results found for this basename: CHOL, TRIG, HDL, CHOLHDL, VLDL, LDLCALC,  in the last 168 hours  CBG: No results found for this basename: GLUCAP,  in the last 168 hours  Microbiology: No results found for this or any previous visit.  Coagulation Studies: No results found for this basename: LABPROT, INR,  in the last 72 hours  Imaging: Ct Head Wo Contrast  05/10/2014   CLINICAL DATA:  Patient unresponsive.  EXAM: CT HEAD WITHOUT  CONTRAST  TECHNIQUE: Contiguous axial images were obtained from the base of the skull through the vertex without intravenous contrast.  COMPARISON:  None.  FINDINGS: There is no evidence of acute intracranial abnormality including infarct, hemorrhage, mass lesion, mass effect, midline shift or abnormal extra-axial fluid collection. Mucous retention cysts or polyps are seen in the maxillary sinuses bilaterally. Mastoid air cells and middle ears are clear. The calvarium is intact.  IMPRESSION: No acute intracranial abnormality.  Mucous retention cysts or polyps in the maxillary sinuses.   Electronically Signed   By: Inge Rise M.D.   On: 05/10/2014 09:04   Dg Chest Port 1 View  05/10/2014   CLINICAL DATA:  58 year old male found unresponsive in home by roommate at 0630 hr, bradycardia on EMS arrival, intubated. Initial encounter.  EXAM: PORTABLE CHEST - 1 VIEW  COMPARISON:  None.  FINDINGS: Portable AP supine view at 0818 hr. Endotracheal tube in good position at the level the clavicles. Enteric tube courses to the abdomen, side hole the level of the gastric body.  Mildly low lung volumes. Normal cardiac size and mediastinal contours. Visualized tracheal air column is within normal limits. No pneumothorax, pulmonary edema, pleural effusion or confluent pulmonary opacity.  IMPRESSION: 1. ET tube and enteric tube in good position. 2. Mildly low lung volumes.  No acute cardiopulmonary abnormality.   Electronically Signed   By: Lars Pinks M.D.   On: 05/10/2014 08:34   Dg Chest Port 1v Same Day  05/10/2014   CLINICAL DATA:  Central catheter placement ; hypoxia  EXAM: PORTABLE CHEST - 1 VIEW SAME DAY  COMPARISON:  Study obtained earlier in the day  FINDINGS: Central catheter tip is in the left innominate vein. Endotracheal tube tip is 4.1 cm above the carina. Nasogastric tube tip and side port  are in the stomach. No pneumothorax. There is no edema or consolidation. Heart is upper normal in size with pulmonary  vascularity within normal limits. Thoracolumbar levoscoliosis is stable.  IMPRESSION: New central catheter tip is in the left innominate vein. Tube positions as described. No pneumothorax. No lung edema or consolidation.   Electronically Signed   By: Lowella Grip M.D.   On: 05/10/2014 10:48       Assessment and plan per attending neurologist  Etta Quill PA-C [facility] [phone]  05/10/2014, 10:54 AM   Assessment/Plan:  58 year old male with altered mental status in the setting of unresponsiveness, respiratory failure. Rapid atrial fibrillation, preceded by bradycardia. I worry that he has had up hypoxic injury given the stimulus-induced myoclonus. Will need to rule out other injury with MRI brain as well as EEG.  1) EEG 2) MRI brain 3) ammonia, TSH 4)  we'll continue to follow  Roland Rack, MD [facility] [phone]  If 7pm- 7am, please page neurology on call as listed in [place].

## 2014-05-10 NOTE — Progress Notes (Signed)
Critical ABG results given to MD. MD Requested to increase I:E to 1:8, however, it would drop the i-time to .45 and that would be uncomfortable to patient. I told him that his current i-time was 1:3.7. He said that would work. I suggested an increase in respiratory rate, however, no changes made at this time.

## 2014-05-10 NOTE — Procedures (Signed)
History: 58 year old male found down acidotic  Sedation: Fentanyl  Technique: This is a 17 channel routine scalp EEG performed at the bedside with bipolar and monopolar montages arranged in accordance to the international 10/20 system of electrode placement. One channel was dedicated to EKG recording.    Background: The background is moderately disorganized with generalized irregular slow activity. There is a posterior rhythm seen at times with a frequency of 8 Hz. There are moderately frequent right occipital sharp waves without evolution to suggest ongoing seizure activity. There are sleep structures seen at times.  Photic stimulation: Physiologic driving is not performed  EEG Abnormalities: 1) right occipital sharp waves 2) generalized regular slow activity  Clinical Interpretation: This EEG is consistent with a right occipital area of potential epileptic seizure focus. There was no seizure recorded on this study.   Roland Rack, MD [facility] [phone]  If 7pm- 7am, please page neurology on call as listed in [place].

## 2014-05-10 NOTE — H&P (Addendum)
PULMONARY / CRITICAL CARE MEDICINE   Name: Donald Hughes MRN: [id number] DOB: 07/11/1956    ADMISSION DATE:  05/10/2014   REFERRING MD :  EDP  CHIEF COMPLAINT:  Unresponsive  INITIAL PRESENTATION: 58 year old male with unknown PMH, smoker and chronic user of [place] who was last seen normal at 1 AM on the morning of 10/1 then roommate found him at 6 AM unresponsive.  Some ?of SOB for a few days prior but overall no known hx.  The roommate is not bedside and not answering her phone.  On EMS arrival pt was unresponsive, bradycardic, intubated with [facility] airway.  On arrival to ER was in AFib with RVR with ?ST depression, king airway changed to 6.5 ETT with difficult airway, mod swelling.   STUDIES:  Head CT 10/1>>>Negative Brain MRI 10/1>>> EEG 10/1>>> 2D echo 10/1>>>  SIGNIFICANT EVENTS: 10/1 found unresponsive, intubated and admitted and to ICU.  PAST MEDICAL HISTORY :   has no past medical history on file. Unknown  has no past surgical history on file. Unknown Prior to Admission medications   Not on File  Unknown Allergies not on file unknown  FAMILY HISTORY:  has no family status information on file. CVAs SOCIAL HISTORY:  Unknown.  Per brother was a heavy smoker at one time but he thinks pt has quit.  Does smoke marijuana regularly.   REVIEW OF SYSTEMS:  Unattainable. Patient sedated and intubated.  SUBJECTIVE: Sedated and intubated.  VITAL SIGNS: Temp:  [97.7 F (36.5 C)-98.4 F (36.9 C)] 98.4 F (36.9 C) (10/01 1045) Pulse Rate:  [79-107] 83 (10/01 1045) Resp:  [14-25] 21 (10/01 1045) BP: (68-131)/(45-88) 94/61 mmHg (10/01 1045) SpO2:  [92 %-100 %] 100 % (10/01 1045) Arterial Line BP: (99-127)/(38-57) 121/56 mmHg (10/01 1045) FiO2 (%):  [50 %-100 %] 50 % (10/01 1057) HEMODYNAMICS:   VENTILATOR SETTINGS: Vent Mode:  [-] PRVC FiO2 (%):  [50 %-100 %] 50 % Set Rate:  [16 bmp-24 bmp] 24 bmp Vt Set:  [490 mL] 490 mL PEEP:  [5 cmH20] 5 cmH20 Plateau Pressure:   [17 cmH20] 17 cmH20 INTAKE / OUTPUT: No intake or output data in the 24 hours ending 05/10/14 1059  PHYSICAL EXAMINATION: General:  Disheveled older than stated age male, withdraws to pain but otherwise unresponsive. Neuro:  Unresponsive, withdraws all ext to command, pupils pin point and none reactive, negative dolls eye but otherwise intact. HEENT:  [place]/AT, pupils as above, MMM, neck supple. Cardiovascular:  RRR, Nl S1/S2, -M/R/G. Lungs:  Coarse BS diffusely. Abdomen:  Soft, NT, ND and +BS. Musculoskeletal:  Edema and -tenderness. Skin:  Intact  LABS:  CBC  Recent Labs Lab 05/10/14 0809  WBC 13.0*  HGB 14.3  HCT 48.4  PLT 280   Coag's No results found for this basename: APTT, INR,  in the last 168 hours BMET  Recent Labs Lab 05/10/14 0809  NA 143  K 4.7  CL 95*  CO2 30  BUN 31*  CREATININE 1.24  GLUCOSE 190*   Electrolytes  Recent Labs Lab 05/10/14 0809  CALCIUM 10.0   Sepsis Markers  Recent Labs Lab 05/10/14 0809  LATICACIDVEN 5.6*   ABG  Recent Labs Lab 05/10/14 0835  PHART 7.103*  PCO2ART 91.7*  PO2ART 299.0*   Liver Enzymes No results found for this basename: AST, ALT, ALKPHOS, BILITOT, ALBUMIN,  in the last 168 hours Cardiac Enzymes  Recent Labs Lab 05/10/14 0937  TROPONINI 0.56*   Glucose No results found for  this basename: GLUCAP,  in the last 168 hours  Imaging No results found.   ASSESSMENT / PLAN:  PULMONARY OETT (36.5, difficult airway) 10/1>>> VDRF due to AMS and ?COPD +/- acute exacerbation. Hypercarbia Respiratory acidosis  ?COPD  P:   Vent support - 8cc/kg  F/u ABG  F/u CXR  BD's  Daily SBT  Chest CT to r/o PE or dissection given appearance of the mediastinum.  CARDIOVASCULAR CVL L IJ CVL 10/1>>> R rad aline 10/1>>> Elevated troponin  Bradycardia  Hypotension - unclear etiology.  Suspect r/t acidosis  P:  2D echo  Cycle troponin  Cardiology to see  F/u EKG  pressors as needed to keep MAP  >65 Gentle volume  CVP monitoring  F/u lactate, pct   RENAL ?AKI - mild  Anion gap acidosis  P:   F/u chem  Gentle volume  F/u lactate  GASTROINTESTINAL No active issue  P:   Consider early TF  PPI   HEMATOLOGIC No active issue  P:  SQ heparin  F/u cbc   INFECTIOUS No obvious source infection  P:   BCx2 10/1>>> UC 10/1>>> Sputum 10/1>>> Vanc 10/1>>> Zosyn 10/1>>> Broad spectrum abx and pan culture for now with unclear etiology illness  Narrow quickly as able   ENDOCRINE Hyperglycemia - no known hx DM P:   SSI  HgbA1c  NEUROLOGIC AMS - unknown etiology.  Broad ddx.  ?CVA (no hemorrhage) v hypoxia r/t resp failure, v seizure P:   RASS goal: -1 Neuro to see  EEG pending  MRI brain   Nickolas Madrid, NP 05/10/2014  10:59 AM Pager: [phone] or [phone]  Family updated: brother updated at bedside.   TODAY'S SUMMARY:  Broad ddx, vague hx.  ?primary neuro v cardiac v respiratory.  R/o MI, pan culture, broad spectrum abx. Neuro and cards to see.  Echo, chest CTA, EEG, MRI pending.   I have personally obtained a history, examined the patient, evaluated laboratory and imaging results, formulated the assessment and plan and placed orders.  CRITICAL CARE: The patient is critically ill with multiple organ systems failure and requires high complexity decision making for assessment and support, frequent evaluation and titration of therapies, application of advanced monitoring technologies and extensive interpretation of multiple databases. Critical Care Time devoted to patient care services described in this note is 90 minutes.   *Care during the described time interval was provided by me and/or other providers on the critical care team. I have reviewed this patient's available data, including medical history, events of note, physical examination and test results as part of my evaluation.  Rush Farmer, M.D. [facility] Pulmonary/Critical Care  Medicine. Pager: [phone]. After hours pager: [phone].

## 2014-05-10 NOTE — Progress Notes (Signed)
Patient with 45 second run of vtach.  Patient SBP dropping upon entering the room, unable to palpate a pulse.  Patient then came out of vtach on own and was in NSR with approximately 5 seconds followed by a 7 beat run of vtach.  Patient again come out on own into a sinus rhythm.  MD notified, orders received.  Will continue to monitor.

## 2014-05-10 NOTE — ED Provider Notes (Signed)
Medical screening examination/treatment/procedure(s) were conducted as a shared visit with resident physician and myself.  I personally evaluated the patient during the encounter. I directly supervised all procedures performed by the resident.    EKG Interpretation  Date/Time:  Thursday May 10 2014 08:07:51 EDT Ventricular Rate:  103 PR Interval:  145 QRS Duration: 81 QT Interval:  347 QTC Calculation: 592 R Axis:   -47 Text Interpretation:  Sinus tachycardia LAD, consider left anterior fascicular block Borderline ST depression, diffuse leads Baseline wander in lead(s) V5 Confirmed by Tallie Dodds  MD, Aarushi Hemric ([id number]) on 05/10/2014 8:10:54 AM      I examined the patient. Lungs w/ diminished BS bilaterally. Cardiac exam wnl. Abdomen soft.  Airway was difficult as the pt had significant edema of the epiglottis and aryepiglottic folds. Unable to visualize cords on first pass d/t swelling. 2nd attempt used glidescope and unable to pass 7.5 tube d/t swelling. 3rd attempt w/ glidescope I was able to pass a 6.5 ETT. Pt had no significant desat episodes and was easily bagged w/ BVM between attempts. Dr. Burt Knack [facility] doctor) had visualized ecg sent in prior to arrival by EMS. CC consulted after labs started returning. Pt initially had normal BP but it began to decline to the sys 80's-90's. Added on broad spectrum ABX at this point. Cards consulted d/t elevated troponin.   CRITICAL CARE Performed by: Pamella Pert, S Total critical care time: 50 minutes Critical care time was exclusive of separately billable procedures and treating other patients. Critical care was necessary to treat or prevent imminent or life-threatening deterioration. Critical care was time spent personally by me on the following activities: development of treatment plan with patient and/or surrogate as well as nursing, discussions with consultants, evaluation of patient's response to treatment, examination of patient, obtaining  history from patient or surrogate, ordering and performing treatments and interventions, ordering and review of laboratory studies, ordering and review of radiographic studies, pulse oximetry and re-evaluation of patient's condition.   Pamella Pert, MD 05/10/14 [phone]

## 2014-05-10 NOTE — Procedures (Signed)
Arterial Catheter Insertion Procedure Note [facility] LOS [id number] 11-02-1955  Procedure: Insertion of Arterial Catheter  Indications: Blood pressure monitoring and Frequent blood sampling  Procedure Details Consent: Unable to obtain consent because of altered level of consciousness. Time Out: Verified patient identification, verified procedure, site/side was marked, verified correct patient position, special equipment/implants available, medications/allergies/relevent history reviewed, required imaging and test results available.  Performed  Maximum sterile technique was used including antiseptics, cap, gloves, gown, hand hygiene, mask and sheet. Skin prep: Chlorhexidine; local anesthetic administered 20 gauge catheter was inserted into right radial artery using the Seldinger technique.  Evaluation Blood flow good; BP tracing good. Complications: No apparent complications.   Brita Jurgensen 05/10/2014

## 2014-05-10 NOTE — ED Notes (Signed)
Patient transported to CT with RN, RT, NT

## 2014-05-10 NOTE — Progress Notes (Signed)
Bedside EEG completed, results pending. 

## 2014-05-10 NOTE — ED Notes (Signed)
Pt placed on monitor, continuous pulse oximetry and blood pressure cuff; pt arrived intubated and on LSB via [place] [facility] [facility]

## 2014-05-10 NOTE — Progress Notes (Signed)
Sputum sample obtained( mod, tan, thick) and sent to lab by RT.

## 2014-05-10 NOTE — Progress Notes (Signed)
ANTIBIOTIC CONSULT NOTE - INITIAL  Pharmacy Consult:  Vancomycin / Zosyn Indication:  Rule out sepsis  Allergies not on file  Patient Measurements: Height: 5\' 5"  (165.1 cm) IBW/kg (Calculated) : 61.5 Estimated weight = 85 kg  Vital Signs: Temp: 98.4 F (36.9 C) (10/01 1015) Temp src: Core (Comment) (10/01 1015) BP: 73/49 mmHg (10/01 1015) Pulse Rate: 79 (10/01 1015)  Labs:  Recent Labs  05/10/14 0809  WBC 13.0*  HGB 14.3  PLT 280  CREATININE 1.24   The CrCl is unknown because both a height and weight (above a minimum accepted value) are required for this calculation. No results found for this basename: VANCOTROUGH, VANCOPEAK, VANCORANDOM, GENTTROUGH, GENTPEAK, GENTRANDOM, TOBRATROUGH, TOBRAPEAK, TOBRARND, AMIKACINPEAK, AMIKACINTROU, AMIKACIN,  in the last 72 hours   Microbiology: No results found for this or any previous visit (from the past 720 hour(s)).  Medical History: No past medical history on file.    Assessment: 84 YOM admitted after being found unresponsive at home.  Pharmacy consulted to manage vancomycin and Zosyn for rule out sepsis.  Baseline labs reviewed and noted first doses of antibiotics already ordered.   Goal of Therapy:  Vancomycin trough level 15-20 mcg/ml   Plan:  - Vanc 1gm IV Q12H - Zosyn 3.375gm IV Q8H, 4 hr infusion - Monitor renal fxn, clinical progress, vanc trough as indicated - F/U updated weight and allergies   Clint Strupp D. Mina Marble, PharmD, BCPS Pager:  [phone] 05/10/2014, 10:48 AM

## 2014-05-10 NOTE — ED Notes (Signed)
Patient arrived to ed via [facility] , states patient was last seen at 1am by roommate was found unresponsive at 630am upon ems arrival patient was unresp. Brady rate 30-60 King airway was placed after ventilating patient heart rate increased to a-fib with RVR rate 130, pupils 3 bilaterally and reactive. Upon arrival to Ed patient is unresp. King in place being bagged. pupls reamain 3 bilaterally and reactive. No purposeful movement,

## 2014-05-11 ENCOUNTER — Inpatient Hospital Stay (HOSPITAL_COMMUNITY): Payer: Medicaid Other

## 2014-05-11 DIAGNOSIS — I517 Cardiomegaly: Secondary | ICD-10-CM

## 2014-05-11 DIAGNOSIS — J9600 Acute respiratory failure, unspecified whether with hypoxia or hypercapnia: Secondary | ICD-10-CM

## 2014-05-11 DIAGNOSIS — R404 Transient alteration of awareness: Secondary | ICD-10-CM

## 2014-05-11 LAB — GLUCOSE, CAPILLARY
GLUCOSE-CAPILLARY: 107 mg/dL — AB (ref 70–99)
GLUCOSE-CAPILLARY: 97 mg/dL (ref 70–99)
Glucose-Capillary: 108 mg/dL — ABNORMAL HIGH (ref 70–99)
Glucose-Capillary: 100 mg/dL — ABNORMAL HIGH (ref 70–99)
Glucose-Capillary: 123 mg/dL — ABNORMAL HIGH (ref 70–99)
Glucose-Capillary: 95 mg/dL (ref 70–99)

## 2014-05-11 LAB — URINE CULTURE
COLONY COUNT: NO GROWTH
Culture: NO GROWTH

## 2014-05-11 LAB — CBC
HCT: 36 % — ABNORMAL LOW (ref 39.0–52.0)
HEMOGLOBIN: 11.2 g/dL — AB (ref 13.0–17.0)
MCH: 24.2 pg — AB (ref 26.0–34.0)
MCHC: 31.1 g/dL (ref 30.0–36.0)
MCV: 77.9 fL — ABNORMAL LOW (ref 78.0–100.0)
PLATELETS: 169 10*3/uL (ref 150–400)
RBC: 4.62 MIL/uL (ref 4.22–5.81)
RDW: 18 % — ABNORMAL HIGH (ref 11.5–15.5)
WBC: 10.9 10*3/uL — ABNORMAL HIGH (ref 4.0–10.5)

## 2014-05-11 LAB — BASIC METABOLIC PANEL
Anion gap: 13 (ref 5–15)
BUN: 29 mg/dL — ABNORMAL HIGH (ref 6–23)
CALCIUM: 8.3 mg/dL — AB (ref 8.4–10.5)
CO2: 25 mEq/L (ref 19–32)
Chloride: 107 mEq/L (ref 96–112)
Creatinine, Ser: 1.01 mg/dL (ref 0.50–1.35)
GFR calc Af Amer: >90 mL/min (ref >90)
GFR, EST NON AFRICAN AMERICAN: 81 mL/min — AB (ref >90)
GLUCOSE: 110 mg/dL — AB (ref 70–99)
Potassium: 3.6 mEq/L — ABNORMAL LOW (ref 3.7–5.3)
SODIUM: 145 meq/L (ref 137–147)

## 2014-05-11 LAB — TSH: TSH: 0.739 u[IU]/mL (ref 0.350–4.500)

## 2014-05-11 LAB — LACTIC ACID, PLASMA: Lactic Acid, Venous: 1.2 mmol/L (ref 0.5–2.2)

## 2014-05-11 LAB — MAGNESIUM
MAGNESIUM: 1.5 mg/dL (ref 1.5–2.5)
MAGNESIUM: 2.7 mg/dL — AB (ref 1.5–2.5)

## 2014-05-11 LAB — PHOSPHORUS: Phosphorus: 1.7 mg/dL — ABNORMAL LOW (ref 2.3–4.6)

## 2014-05-11 MED ORDER — DEXMEDETOMIDINE HCL IN NACL 200 MCG/50ML IV SOLN
0.4000 ug/kg/h | INTRAVENOUS | Status: DC
  Administered 2014-05-11 (×2): 0.4 ug/kg/h via INTRAVENOUS
  Administered 2014-05-12: 0.1 ug/kg/h via INTRAVENOUS
  Filled 2014-05-11 (×3): qty 50

## 2014-05-11 MED ORDER — POTASSIUM CHLORIDE 20 MEQ/15ML (10%) PO LIQD
20.0000 meq | ORAL | Status: AC
  Administered 2014-05-11 (×2): 20 meq
  Filled 2014-05-11 (×2): qty 15

## 2014-05-11 MED ORDER — DEXAMETHASONE SODIUM PHOSPHATE 4 MG/ML IJ SOLN
4.0000 mg | Freq: Two times a day (BID) | INTRAMUSCULAR | Status: DC
  Administered 2014-05-11 – 2014-05-12 (×3): 4 mg via INTRAVENOUS
  Filled 2014-05-11 (×8): qty 1

## 2014-05-11 MED ORDER — ASPIRIN EC 81 MG PO TBEC
81.0000 mg | DELAYED_RELEASE_TABLET | Freq: Every day | ORAL | Status: DC
  Administered 2014-05-11: 81 mg via ORAL
  Filled 2014-05-11 (×2): qty 1

## 2014-05-11 MED ORDER — ASPIRIN 325 MG PO TABS
325.0000 mg | ORAL_TABLET | Freq: Once | ORAL | Status: AC
  Administered 2014-05-11: 325 mg via ORAL
  Filled 2014-05-11: qty 1

## 2014-05-11 MED ORDER — SODIUM CHLORIDE 0.9 % IV SOLN
6.0000 g | Freq: Once | INTRAVENOUS | Status: AC
  Administered 2014-05-11: 6 g via INTRAVENOUS
  Filled 2014-05-11: qty 12

## 2014-05-11 MED ORDER — SODIUM PHOSPHATE 3 MMOLE/ML IV SOLN
20.0000 mmol | Freq: Once | INTRAVENOUS | Status: AC
  Administered 2014-05-11: 20 mmol via INTRAVENOUS
  Filled 2014-05-11: qty 6.67

## 2014-05-11 MED ORDER — LABETALOL HCL 5 MG/ML IV SOLN
20.0000 mg | INTRAVENOUS | Status: DC | PRN
  Administered 2014-05-11 – 2014-05-13 (×3): 20 mg via INTRAVENOUS
  Filled 2014-05-11 (×4): qty 4

## 2014-05-11 MED ORDER — HALOPERIDOL LACTATE 5 MG/ML IJ SOLN
5.0000 mg | Freq: Once | INTRAMUSCULAR | Status: AC
  Administered 2014-05-11: 5 mg via INTRAMUSCULAR
  Filled 2014-05-11: qty 1

## 2014-05-11 NOTE — Progress Notes (Signed)
PULMONARY / CRITICAL CARE MEDICINE   Name: Donald Hughes MRN: [id number] DOB: 23-Jan-1956    ADMISSION DATE:  05/10/2014   REFERRING MD :  EDP  CHIEF COMPLAINT:  Unresponsive  INITIAL PRESENTATION: 58 year old male with unknown PMH, smoker and chronic user of Galateo who was last seen normal at 1 AM on the morning of 10/1 then roommate found him at 6 AM unresponsive.  Some ?of SOB for a few days prior but overall no known hx.  The roommate is not bedside and not answering her phone.  On EMS arrival pt was unresponsive, bradycardic, intubated with [facility] airway.  On arrival to ER was in AFib with RVR with ?ST depression, king airway changed to 6.5 ETT with difficult airway, mod swelling.   STUDIES:  Head CT 10/1>>>Negative Brain MRI 10/1>>> EEG 10/1>>> 2D echo 10/1>>>  SIGNIFICANT EVENTS: 10/1 found unresponsive, intubated and admitted and to ICU.  SUBJECTIVE: Alert and interactive, following commands and answering questions.  VITAL SIGNS: Temp:  [99 F (37.2 C)-100.6 F (38.1 C)] 99.5 F (37.5 C) (10/02 0800) Pulse Rate:  [68-95] 72 (10/02 0847) Resp:  [17-28] 17 (10/02 0847) BP: (106-157)/(56-85) 137/72 mmHg (10/02 0847) SpO2:  [94 %-100 %] 96 % (10/02 0847) Arterial Line BP: (117-140)/(63-75) 140/75 mmHg (10/01 1145) FiO2 (%):  [40 %] 40 % (10/02 0847) Weight:  [84.6 kg (186 lb 8.2 oz)-85 kg (187 lb 6.3 oz)] 84.6 kg (186 lb 8.2 oz) (10/02 0349)  HEMODYNAMICS: CVP:  [2 mmHg-16 mmHg] 8 mmHg  VENTILATOR SETTINGS: Vent Mode:  [-] PSV;CPAP FiO2 (%):  [40 %] 40 % Set Rate:  [28 bmp] 28 bmp Vt Set:  [490 mL] 490 mL PEEP:  [5 cmH20] 5 cmH20 Pressure Support:  [10 cmH20] 10 cmH20 Plateau Pressure:  [18 cmH20-20 cmH20] 18 cmH20  INTAKE / OUTPUT:  Intake/Output Summary (Last 24 hours) at 05/11/14 1117 Last data filed at 05/11/14 0800  Gross per 24 hour  Intake 3296.83 ml  Output   1575 ml  Net 1721.83 ml   PHYSICAL EXAMINATION: General: Easily arousable and  following commands. Neuro: Moving all ext to command, weaning. HEENT:  [place]/AT, pupils reactive, MMM, neck supple. Cardiovascular:  RRR, Nl S1/S2, -M/R/G. Lungs:  Coarse BS diffusely. Abdomen:  Soft, NT, ND and +BS. Musculoskeletal:  Edema and -tenderness. Skin:  Intact  LABS:  CBC  Recent Labs Lab 05/10/14 0809 05/10/14 1130 05/11/14 0320  WBC 13.0* 14.9* 10.9*  HGB 14.3 11.2* 11.2*  HCT 48.4 38.1* 36.0*  PLT 280 183 169   Coag's  Recent Labs Lab 05/10/14 1130  APTT 27  INR 1.37   BMET  Recent Labs Lab 05/10/14 1130 05/10/14 1309 05/10/14 2159 05/11/14 0320  NA 145  --  147 145  K 4.3 4.1 3.6* 3.6*  CL 106  --  108 107  CO2 28  --  25 25  BUN 33*  --  28* 29*  CREATININE 1.23  --  1.07 1.01  GLUCOSE 80  --  87 110*   Electrolytes  Recent Labs Lab 05/10/14 1130 05/10/14 1309 05/10/14 2159 05/11/14 0320  CALCIUM 7.8*  --  8.2* 8.3*  MG  --  1.5 1.5 1.5  PHOS  --  2.1*  --  1.7*   Sepsis Markers  Recent Labs Lab 05/10/14 0809 05/11/14 0320  LATICACIDVEN 5.6* 1.2   ABG  Recent Labs Lab 05/10/14 0835 05/10/14 1105  PHART 7.103* 7.256*  PCO2ART 91.7* 66.9*  PO2ART 299.0*  156.0*   Liver Enzymes  Recent Labs Lab 05/10/14 1130  AST 64*  ALT 31  ALKPHOS 72  BILITOT 0.3  ALBUMIN 2.5*   Cardiac Enzymes  Recent Labs Lab 05/10/14 0937 05/10/14 1130 05/10/14 1615 05/10/14 2230  TROPONINI 0.56* 2.41* 7.08* 5.51*  PROBNP  --  1793.0*  --   --    Glucose  Recent Labs Lab 05/10/14 1534 05/10/14 1958 05/10/14 2351 05/11/14 0402 05/11/14 0804  GLUCAP 73 84 89 108* 107*    Imaging Ct Angio Head W/cm &/or Wo Cm  05/10/2014   CLINICAL DATA:  INITIAL ENCOUNTER. CODE STROKE. Patient found unresponsive at 6 o'clock a.m. Last seen well at 1 o'clock a.m.  EXAM: CT ANGIOGRAPHY HEAD AND NECK  TECHNIQUE: Multidetector CT imaging of the head and neck was performed using the standard protocol during bolus administration of intravenous  contrast. Multiplanar CT image reconstructions and MIPs were obtained to evaluate the vascular anatomy. Carotid stenosis measurements (when applicable) are obtained utilizing NASCET criteria, using the distal internal carotid diameter as the denominator.  CONTRAST:  77mL OMNIPAQUE IOHEXOL 350 MG/ML SOLN, 37mL OMNIPAQUE IOHEXOL 350 MG/ML SOLN  COMPARISON:  CT head without contrast 05/10/2014.  FINDINGS: CTA HEAD FINDINGS  The source images demonstrate no acute infarct. The basal ganglia are intact. The postcontrast images demonstrate no pathologic enhancement. The patient is intubated. An NG tube is in place.  Atherosclerotic changes are present within the distal internal carotid arteries bilaterally without significant stenosis. The A1 and M1 segments are normal. The anterior communicating artery is patent. The bifurcations are intact. There is moderate attenuation of distal MCA branch vessels bilaterally.  The distal left vertebral artery is narrowed P on the dura. Right vertebral artery is intact. The left PICA origin is visualized and normal. The right AICA is dominant. The basilar artery is within normal limits. The left posterior cerebral artery originates from the basilar tip. The right posterior cerebral artery is of fetal type. There is moderate attenuation of PCA branch vessels bilaterally.  Review of the MIP images confirms the above findings.  CTA NECK FINDINGS  A 3 vessel arch configuration is present. Right lateral irregularity and focally ectasia is noted in the proximal innominate artery. The artery measures up to 21 mm in diameter. The proximal vessels are otherwise normal. Atherosclerotic calcifications are present in the aortic arch.  The vertebral artery is slightly dominant to the left. There is focal narrowing of the distal left vertebral artery at the level of C2-2.  The right common carotid artery demonstrates atherosclerotic changes at the bifurcation. There some calcific changes in the  proximal right ICA without significant stenosis. The right ICA is tortuous without distal stenosis.  The left common carotid artery is within normal limits. Atherosclerotic changes are noted at the bifurcation and proximal left ICA without significant stenosis. The left ICA is tortuous proximally. The distal left ICA is normal.  The bone windows demonstrate multilevel disc disease with uncovertebral spurring from C3-4 through C6-7. No focal lytic or blastic lesions are present. The lung apices are clear.  Review of the MIP images confirms the above findings.  IMPRESSION: 1. Atherosclerotic changes at the carotid bifurcations bilaterally without significant stenosis. 2. ICA tortuosity is worse right than left without significant stenosis. 3. Focal narrowing of the distal left vertebral artery at the level of C2 and the dura with a small irregular vessel extending to the vertebrobasilar junction. 4. The basilar artery is intact. 5. Focal ectasia of the innominate  artery measuring up to 21 mm just above the aortic arch. 6. Atherosclerotic changes within the cavernous carotid arteries bilaterally without significant stenosis. 7. Moderate distal small vessel disease in the MCA and PCA branch vessels. These results were called by telephone at the time of interpretation on 05/10/2014 at 12:12 pm to Dr. Roland Rack, who verbally acknowledged these results.   Electronically Signed   By: Lawrence Santiago M.D.   On: 05/10/2014 12:44   Ct Head Wo Contrast  05/10/2014   CLINICAL DATA:  Patient unresponsive.  EXAM: CT HEAD WITHOUT CONTRAST  TECHNIQUE: Contiguous axial images were obtained from the base of the skull through the vertex without intravenous contrast.  COMPARISON:  None.  FINDINGS: There is no evidence of acute intracranial abnormality including infarct, hemorrhage, mass lesion, mass effect, midline shift or abnormal extra-axial fluid collection. Mucous retention cysts or polyps are seen in the maxillary  sinuses bilaterally. Mastoid air cells and middle ears are clear. The calvarium is intact.  IMPRESSION: No acute intracranial abnormality.  Mucous retention cysts or polyps in the maxillary sinuses.   Electronically Signed   By: Inge Rise M.D.   On: 05/10/2014 09:04   Ct Angio Neck W/cm &/or Wo/cm  05/10/2014   CLINICAL DATA:  INITIAL ENCOUNTER. CODE STROKE. Patient found unresponsive at 6 o'clock a.m. Last seen well at 1 o'clock a.m.  EXAM: CT ANGIOGRAPHY HEAD AND NECK  TECHNIQUE: Multidetector CT imaging of the head and neck was performed using the standard protocol during bolus administration of intravenous contrast. Multiplanar CT image reconstructions and MIPs were obtained to evaluate the vascular anatomy. Carotid stenosis measurements (when applicable) are obtained utilizing NASCET criteria, using the distal internal carotid diameter as the denominator.  CONTRAST:  32mL OMNIPAQUE IOHEXOL 350 MG/ML SOLN, 81mL OMNIPAQUE IOHEXOL 350 MG/ML SOLN  COMPARISON:  CT head without contrast 05/10/2014.  FINDINGS: CTA HEAD FINDINGS  The source images demonstrate no acute infarct. The basal ganglia are intact. The postcontrast images demonstrate no pathologic enhancement. The patient is intubated. An NG tube is in place.  Atherosclerotic changes are present within the distal internal carotid arteries bilaterally without significant stenosis. The A1 and M1 segments are normal. The anterior communicating artery is patent. The bifurcations are intact. There is moderate attenuation of distal MCA branch vessels bilaterally.  The distal left vertebral artery is narrowed P on the dura. Right vertebral artery is intact. The left PICA origin is visualized and normal. The right AICA is dominant. The basilar artery is within normal limits. The left posterior cerebral artery originates from the basilar tip. The right posterior cerebral artery is of fetal type. There is moderate attenuation of PCA branch vessels bilaterally.   Review of the MIP images confirms the above findings.  CTA NECK FINDINGS  A 3 vessel arch configuration is present. Right lateral irregularity and focally ectasia is noted in the proximal innominate artery. The artery measures up to 21 mm in diameter. The proximal vessels are otherwise normal. Atherosclerotic calcifications are present in the aortic arch.  The vertebral artery is slightly dominant to the left. There is focal narrowing of the distal left vertebral artery at the level of C2-2.  The right common carotid artery demonstrates atherosclerotic changes at the bifurcation. There some calcific changes in the proximal right ICA without significant stenosis. The right ICA is tortuous without distal stenosis.  The left common carotid artery is within normal limits. Atherosclerotic changes are noted at the bifurcation and proximal left ICA without significant  stenosis. The left ICA is tortuous proximally. The distal left ICA is normal.  The bone windows demonstrate multilevel disc disease with uncovertebral spurring from C3-4 through C6-7. No focal lytic or blastic lesions are present. The lung apices are clear.  Review of the MIP images confirms the above findings.  IMPRESSION: 1. Atherosclerotic changes at the carotid bifurcations bilaterally without significant stenosis. 2. ICA tortuosity is worse right than left without significant stenosis. 3. Focal narrowing of the distal left vertebral artery at the level of C2 and the dura with a small irregular vessel extending to the vertebrobasilar junction. 4. The basilar artery is intact. 5. Focal ectasia of the innominate artery measuring up to 21 mm just above the aortic arch. 6. Atherosclerotic changes within the cavernous carotid arteries bilaterally without significant stenosis. 7. Moderate distal small vessel disease in the MCA and PCA branch vessels. These results were called by telephone at the time of interpretation on 05/10/2014 at 12:12 pm to Dr. Roland Rack, who verbally acknowledged these results.   Electronically Signed   By: Lawrence Santiago M.D.   On: 05/10/2014 12:44   Ct Angio Chest Pe W/cm &/or Wo Cm  05/10/2014   CLINICAL DATA:  Found on couch unresponsive. Shortness of breath for 3-4 days.  EXAM: CT ANGIOGRAPHY CHEST WITH CONTRAST  TECHNIQUE: Multidetector CT imaging of the chest was performed using the standard protocol during bolus administration of intravenous contrast. Multiplanar CT image reconstructions and MIPs were obtained to evaluate the vascular anatomy.  CONTRAST:  21mL OMNIPAQUE IOHEXOL 350 MG/ML SOLN, 43mL OMNIPAQUE IOHEXOL 350 MG/ML SOLN  COMPARISON:  None  FINDINGS: Mediastinum: The heart size appears normal. No pericardial effusion. The trachea appears patent. There is an endotracheal tube with tip above the carina. Nasogastric tube is identified within the esophagus. There is no mediastinal or hilar adenopathy identified. No supraclavicular or axillary adenopathy. There is calcified atherosclerotic disease involving the thoracic aorta. Calcified plaque is also noted within the LAD and RCA coronary arteries. Normal appearance of the pulmonary artery. No lobar or segmental filling defects identified to suggest pulmonary embolus.  Lungs/Pleura: There is no pleural effusion identified. Dependent changes noted in the right lung base. No airspace consolidation. Left upper lobe lung nodule measures 8 mm.  Upper Abdomen: Incidental imaging through the upper abdomen is unremarkable.  Musculoskeletal: Review of the visualized bony structures is significant for thoracic kyphosis. There is multi level degenerative disc disease identified. No acute bone abnormality noted.  Review of the MIP images confirms the above findings.  IMPRESSION: 1. Exam is negative for acute pulmonary embolus. 2. Atherosclerotic disease including multi vessel coronary artery calcification. 3. Left lung nodule measures 8 mm If the patient is at high risk for  bronchogenic carcinoma, follow-up chest CT at 3-73months is recommended. If the patient is at low risk for bronchogenic carcinoma, follow-up chest CT at 6-12 months is recommended. This recommendation follows the consensus statement: Guidelines for Management of Small Pulmonary Nodules Detected on CT Scans: A Statement from the [employer] as published in Radiology 2005; 237:395-400.   Electronically Signed   By: Kerby Moors M.D.   On: 05/10/2014 12:29   Dg Chest Port 1 View  05/10/2014   CLINICAL DATA:  58 year old male found unresponsive in home by roommate at 0630 hr, bradycardia on EMS arrival, intubated. Initial encounter.  EXAM: PORTABLE CHEST - 1 VIEW  COMPARISON:  None.  FINDINGS: Portable AP supine view at 0818 hr. Endotracheal tube in good position at  the level the clavicles. Enteric tube courses to the abdomen, side hole the level of the gastric body.  Mildly low lung volumes. Normal cardiac size and mediastinal contours. Visualized tracheal air column is within normal limits. No pneumothorax, pulmonary edema, pleural effusion or confluent pulmonary opacity.  IMPRESSION: 1. ET tube and enteric tube in good position. 2. Mildly low lung volumes.  No acute cardiopulmonary abnormality.   Electronically Signed   By: Lars Pinks M.D.   On: 05/10/2014 08:34   Dg Chest Port 1v Same Day  05/10/2014   CLINICAL DATA:  Central catheter placement ; hypoxia  EXAM: PORTABLE CHEST - 1 VIEW SAME DAY  COMPARISON:  Study obtained earlier in the day  FINDINGS: Central catheter tip is in the left innominate vein. Endotracheal tube tip is 4.1 cm above the carina. Nasogastric tube tip and side port are in the stomach. No pneumothorax. There is no edema or consolidation. Heart is upper normal in size with pulmonary vascularity within normal limits. Thoracolumbar levoscoliosis is stable.  IMPRESSION: New central catheter tip is in the left innominate vein. Tube positions as described. No pneumothorax. No lung edema  or consolidation.   Electronically Signed   By: Lowella Grip M.D.   On: 05/10/2014 10:48     ASSESSMENT / PLAN:  PULMONARY OETT (36.5, difficult airway) 10/1>>> VDRF due to AMS and ?COPD +/- acute exacerbation. Hypercarbia Respiratory acidosis  ?COPD  P:   Wean to extubate today. Titrate O2 for sats of 88-92%. IS per RT protocol.  CARDIOVASCULAR CVL L IJ CVL 10/1>>> R rad aline 10/1>>> Elevated troponin  HTN P:  2D echo pending. Troponin positive Cardiology to manage. D/C pressors. KVO IVF Once extubated and able to take PO will need PO anti-HTN to be added but hold for now (HTN but agitated due to ETT). CVP monitoring. D/C stress dose steroids.  RENAL ?AKI - mild  Anion gap acidosis  P:   F/u chem  KVO IVF Replace electrolytes as indicated.  GASTROINTESTINAL No active issue  P:   Swallow evaluation post extubation PPI   HEMATOLOGIC No active issue  P:  SQ heparin  F/u cbc   INFECTIOUS No obvious source infection  P:   BCx2 10/1>>> UC 10/1>>> Sputum 10/1>>> Vanc 10/1>>> Zosyn 10/1>>> Broad spectrum abx and pan culture for now with unclear etiology illness  Narrow quickly as able   ENDOCRINE Hyperglycemia - no known hx DM P:   SSI  HgbA1c  NEUROLOGIC AMS - unknown etiology.  Broad ddx.  ?CVA (no hemorrhage) v hypoxia r/t resp failure, v seizure P:   D/C sedation for extubation Neuro input appreciated EEG pending  MRI brain   Family updated: brother updated at bedside.   TODAY'S SUMMARY:  Much more alert and interactive this AM, unsure why he became unresponsive, the entire presentation seems drug related but the UDS was negative other than THC.  He is much improved this AM, will extubate then question.   I have personally obtained a history, examined the patient, evaluated laboratory and imaging results, formulated the assessment and plan and placed orders.  CRITICAL CARE: The patient is critically ill with multiple organ systems  failure and requires high complexity decision making for assessment and support, frequent evaluation and titration of therapies, application of advanced monitoring technologies and extensive interpretation of multiple databases. Critical Care Time devoted to patient care services described in this note is 35 minutes.   *Care during the described time interval was provided by  me and/or other providers on the critical care team. I have reviewed this patient's available data, including medical history, events of note, physical examination and test results as part of my evaluation.  Rush Farmer, M.D. [facility] Pulmonary/Critical Care Medicine. Pager: [phone]. After hours pager: [phone].

## 2014-05-11 NOTE — Progress Notes (Signed)
eLink Physician-Brief Progress Note Patient Name: Donald Hughes DOB: [birth year] MRN: [id number]   Date of Service  05/11/2014  HPI/Events of Note  Still agitated, once again pulled out all lines Not easily redirected Would prefer to avoid benzo Didn't respond to haldol earlier  eICU Interventions  precedex     Intervention Category Minor Interventions: Agitation / anxiety - evaluation and management  Janaisha Tolsma 05/11/2014, 8:06 PM

## 2014-05-11 NOTE — Procedures (Signed)
Extubation Procedure Note  Patient Details:   Name: Donald Hughes DOB: July 18, 1956 MRN: [id number]   Airway Documentation:     Evaluation  O2 sats: stable throughout Complications: No apparent complications Patient did tolerate procedure well. Bilateral Breath Sounds: Clear;Diminished Suctioning: Airway Yes  Patient was extubated per order and placed on 4 LPM nasal cannula. Pt had positive cuff leak. Pt was able to vocalize name and had a strong productive cough. No stridor noted. BBS clear;dimisnhed. RT will continue to monitor pt.   Kjersti Dittmer M 05/11/2014, 12:15 PM

## 2014-05-11 NOTE — Progress Notes (Signed)
Pt pulled out central line and peripheral line and pulled off all leads and BP cuff.  Davis Lacy MD notified.  Orders received.  Will continue to monitor.

## 2014-05-11 NOTE — Progress Notes (Signed)
Dr. Nicole Kindred notified by telephone that patient had experienced four runs of ventricular tachycardia, four episodes of bigeminy in the past 30 minutes. Also notified of perioral movement accompanying the cardiac rhythm changes. Received orders for labs and 12 lead EKG.

## 2014-05-11 NOTE — Progress Notes (Signed)
Talked to Dr. Friedman/Cardiology about critical value on CKMB. Recieved order for aspirin.

## 2014-05-11 NOTE — Progress Notes (Signed)
[employer] Progress Note Patient Name: Donald Hughes DOB: October 12, 1955 MRN: [id number]   Date of Service  05/11/2014  HPI/Events of Note  Pulling at tubes and lines Marked agitation  eICU Interventions  Haldol now     Intervention Category Minor Interventions: Agitation / anxiety - evaluation and management  MCQUAID, DOUGLAS 05/11/2014, 4:45 PM

## 2014-05-11 NOTE — Progress Notes (Signed)
Pt pulled out IV and pulled off all leads.  MD at bedside.  Pt alert to self and place. Foley D/C'd for safety. Will continue to monitor.

## 2014-05-11 NOTE — Progress Notes (Signed)
[facility] Progress Note Patient Name: Donald Hughes DOB: 20-Mar-1956 MRN: [id number]   Date of Service  05/11/2014  HPI/Events of Note  Nurse notes hoarsenss, ? Stridor No resp distress  eICU Interventions  Decadron scheduled     Intervention Category Major Interventions: Airway management  Chontel Warning 05/11/2014, 5:26 PM

## 2014-05-11 NOTE — Progress Notes (Addendum)
INITIAL NUTRITION ASSESSMENT  DOCUMENTATION CODES Per approved criteria  -Obesity Unspecified   INTERVENTION: If pt remains intubated recommend: Initiate Vital High Protein @ 45 ml/hr via OG tube.   30 ml Prostat BID.    Tube feeding regimen provides 1280 kcal (66% of needs), 124 grams of protein, and 902 ml of H2O.   NUTRITION DIAGNOSIS: Inadequate oral intake related to inability to eat as evidenced by NPO status  Goal: Enteral nutrition to provide 60-70% of estimated calorie needs (22-25 kcals/kg ideal body weight) and 100% of estimated protein needs, based on ASPEN guidelines for permissive underfeeding in critically ill obese individuals  Monitor:  Respiratory status, TF initiation and tolerance, labs  Reason for Assessment: Ventilator   58 y.o. male  Admitting Dx: Donald Hughes  ASSESSMENT: Pt with unknown medical hx found unresponsive by roommate. Pt intubated due to AMS and possible COPD with acute exacerbation. Work up ongoing.   Patient is currently intubated on ventilator support MV: 12.6 L/min Temp (24hrs), Avg:99.2 F (37.3 C), Min:98.2 F (36.8 C), Max:100.6 F (38.1 C)  Propofol: none No signs of fat or muscle depletion noted.  Labs:  Potassium and Phosphorus low - pt on IV replacement   Height: Ht Readings from Last 1 Encounters:  05/11/14 5\' 5"  (1.651 m)    Weight: Wt Readings from Last 1 Encounters:  05/11/14 186 lb 8.2 oz (84.6 kg)    Ideal Body Weight: 61.8 kg   % Ideal Body Weight: 137%  Wt Readings from Last 10 Encounters:  05/11/14 186 lb 8.2 oz (84.6 kg)    Usual Body Weight: unknown  % Usual Body Weight: -  BMI:  Body mass index is 31.04 kg/(m^2).  Estimated Nutritional Needs: Kcal: 1949 Protein: >/= 124 grams Fluid: > 1.9 L/day  Skin: WDL  Diet Order: NPO  EDUCATION NEEDS: -No education needs identified at this time   Intake/Output Summary (Last 24 hours) at 05/11/14 1002 Last data filed at 05/11/14 0800  Gross per 24 hour  Intake 3296.83 ml  Output   1575 ml  Net 1721.83 ml    Last BM: PTA   Labs:   Recent Labs Lab 05/10/14 1130 05/10/14 1309 05/10/14 2159 05/11/14 0320  NA 145  --  147 145  K 4.3 4.1 3.6* 3.6*  CL 106  --  108 107  CO2 28  --  25 25  BUN 33*  --  28* 29*  CREATININE 1.23  --  1.07 1.01  CALCIUM 7.8*  --  8.2* 8.3*  MG  --  1.5 1.5 1.5  PHOS  --  2.1*  --  1.7*  GLUCOSE 80  --  87 110*    CBG (last 3)   Recent Labs  05/10/14 2351 05/11/14 0402 05/11/14 0804  GLUCAP 89 108* 107*    Scheduled Meds: . antiseptic oral rinse  7 mL Mouth Rinse QID  . aspirin EC  81 mg Oral Daily  . chlorhexidine  15 mL Mouth Rinse BID  . heparin subcutaneous  5,000 Units Subcutaneous 3 times per day  . hydrocortisone sodium succinate  50 mg Intravenous Q6H  . Influenza vac split quadrivalent PF  0.5 mL Intramuscular Tomorrow-1000  . insulin aspart  0-15 Units Subcutaneous 6 times per day  . ipratropium-albuterol  3 mL Nebulization Q4H  . levETIRAcetam  500 mg Intravenous Q12H  . magnesium sulfate LVP 250-500 ml  6 g Intravenous Once  . pantoprazole (PROTONIX) IV  40 mg Intravenous Q24H  .  piperacillin-tazobactam  3.375 g Intravenous Once  . piperacillin-tazobactam (ZOSYN)  IV  3.375 g Intravenous Q8H  . potassium chloride  20 mEq Per Tube Q4H  . sodium phosphate  Dextrose 5% IVPB  20 mmol Intravenous Once  . vancomycin  1,000 mg Intravenous Q12H    Continuous Infusions: . sodium chloride 100 mL/hr at 05/11/14 0000  . fentaNYL infusion INTRAVENOUS 50 mcg/hr (05/11/14 0800)  . norepinephrine (LEVOPHED) Adult infusion      Past Medical History  Diagnosis Date  . Difficult intubation 05/10/14    laryngeal edema, difficult DL and video laryngoscopy    History reviewed. No pertinent past surgical history.  [place], [place], [place] Pager [phone] After Hours Pager

## 2014-05-11 NOTE — Progress Notes (Addendum)
Subjective: Much improved overnight  Exam: Filed Vitals:   05/11/14 1540  BP: 147/102  Pulse: 92  Temp:   Resp: 21   Gen: In bed, NAD MS: Awake, interactive, follows commands CN: Pupils equal round and reactive, extraocular movements intact, but his to threat bilaterally Motor: Moves all extremities to command Sensory: Endorses sensation bilaterally   Impression: 58 year old male with a history of altered metal status unclear etiology. He had an EEG with some suggestion of cortical irritability and seizure therefore is in the differential as the driver of this process.   Recommendations: 1) continue Keppra 500 mg twice a day 2) repeat EEG today  Roland Rack, MD [facility] [phone]  If 7pm- 7am, please page neurology on call as listed in [place].

## 2014-05-11 NOTE — Progress Notes (Signed)
[facility] Progress Note Patient Name: Donald Hughes DOB: April 08, 1956 MRN: [id number]   Date of Service  05/11/2014  HPI/Events of Note  hypertension  eICU Interventions  Prn labetalol     Intervention Category Minor Interventions: Agitation / anxiety - evaluation and management  MCQUAID, DOUGLAS 05/11/2014, 6:11 PM

## 2014-05-11 NOTE — Progress Notes (Addendum)
SUBJECTIVE:  Intubated and sedated  OBJECTIVE:   Vitals:   Filed Vitals:   05/11/14 0400 05/11/14 0700 05/11/14 0800 05/11/14 0847  BP:  121/64 137/72 137/72  Pulse:  71 68 72  Temp:  99.5 F (37.5 C) 99.5 F (37.5 C)   TempSrc: Core (Comment)     Resp:  28 28 17   Height:      Weight:      SpO2:  96% 98% 96%   I&O's:   Intake/Output Summary (Last 24 hours) at 05/11/14 0947 Last data filed at 05/11/14 0800  Gross per 24 hour  Intake 3296.83 ml  Output   1575 ml  Net 1721.83 ml   TELEMETRY: Reviewed telemetry pt in NSR:     PHYSICAL EXAM General: Well developed, well nourished, in no acute distress PE not performed due to echo being done  LABS: Basic Metabolic Panel:  Recent Labs  05/10/14 1309 05/10/14 2159 05/11/14 0320  NA  --  147 145  K 4.1 3.6* 3.6*  CL  --  108 107  CO2  --  25 25  GLUCOSE  --  87 110*  BUN  --  28* 29*  CREATININE  --  1.07 1.01  CALCIUM  --  8.2* 8.3*  MG 1.5 1.5 1.5  PHOS 2.1*  --  1.7*   Liver Function Tests:  Recent Labs  05/10/14 1130  AST 64*  ALT 31  ALKPHOS 72  BILITOT 0.3  PROT 5.9*  ALBUMIN 2.5*   No results found for this basename: LIPASE, AMYLASE,  in the last 72 hours CBC:  Recent Labs  05/10/14 0809 05/10/14 1130 05/11/14 0320  WBC 13.0* 14.9* 10.9*  NEUTROABS 11.2* 13.3*  --   HGB 14.3 11.2* 11.2*  HCT 48.4 38.1* 36.0*  MCV 83.7 81.8 77.9*  PLT 280 183 169   Cardiac Enzymes:  Recent Labs  05/10/14 0937 05/10/14 1130 05/10/14 1615 05/10/14 2159 05/10/14 2230  CKTOTAL 371*  --   --  686*  --   CKMB  --   --   --  19.6*  --   TROPONINI 0.56* 2.41* 7.08*  --  5.51*   BNP: No components found with this basename: POCBNP,  D-Dimer: No results found for this basename: DDIMER,  in the last 72 hours Hemoglobin A1C:  Recent Labs  05/10/14 1130  HGBA1C 5.8*   Fasting Lipid Panel: No results found for this basename: CHOL, HDL, LDLCALC, TRIG, CHOLHDL, LDLDIRECT,  in the last 72  hours Thyroid Function Tests:  Recent Labs  05/11/14 0355  TSH 0.739   Anemia Panel: No results found for this basename: VITAMINB12, FOLATE, FERRITIN, TIBC, IRON, RETICCTPCT,  in the last 72 hours Coag Panel:   Lab Results  Component Value Date   INR 1.37 05/10/2014    RADIOLOGY: Ct Angio Head W/cm &/or Wo Cm  05/10/2014   CLINICAL DATA:  INITIAL ENCOUNTER. CODE STROKE. Patient found unresponsive at 6 o'clock a.m. Last seen well at 1 o'clock a.m.  EXAM: CT ANGIOGRAPHY HEAD AND NECK  TECHNIQUE: Multidetector CT imaging of the head and neck was performed using the standard protocol during bolus administration of intravenous contrast. Multiplanar CT image reconstructions and MIPs were obtained to evaluate the vascular anatomy. Carotid stenosis measurements (when applicable) are obtained utilizing NASCET criteria, using the distal internal carotid diameter as the denominator.  CONTRAST:  56mL OMNIPAQUE IOHEXOL 350 MG/ML SOLN, 39mL OMNIPAQUE IOHEXOL 350 MG/ML SOLN  COMPARISON:  CT head without  contrast 05/10/2014.  FINDINGS: CTA HEAD FINDINGS  The source images demonstrate no acute infarct. The basal ganglia are intact. The postcontrast images demonstrate no pathologic enhancement. The patient is intubated. An NG tube is in place.  Atherosclerotic changes are present within the distal internal carotid arteries bilaterally without significant stenosis. The A1 and M1 segments are normal. The anterior communicating artery is patent. The bifurcations are intact. There is moderate attenuation of distal MCA branch vessels bilaterally.  The distal left vertebral artery is narrowed P on the dura. Right vertebral artery is intact. The left PICA origin is visualized and normal. The right AICA is dominant. The basilar artery is within normal limits. The left posterior cerebral artery originates from the basilar tip. The right posterior cerebral artery is of fetal type. There is moderate attenuation of PCA branch  vessels bilaterally.  Review of the MIP images confirms the above findings.  CTA NECK FINDINGS  A 3 vessel arch configuration is present. Right lateral irregularity and focally ectasia is noted in the proximal innominate artery. The artery measures up to 21 mm in diameter. The proximal vessels are otherwise normal. Atherosclerotic calcifications are present in the aortic arch.  The vertebral artery is slightly dominant to the left. There is focal narrowing of the distal left vertebral artery at the level of C2-2.  The right common carotid artery demonstrates atherosclerotic changes at the bifurcation. There some calcific changes in the proximal right ICA without significant stenosis. The right ICA is tortuous without distal stenosis.  The left common carotid artery is within normal limits. Atherosclerotic changes are noted at the bifurcation and proximal left ICA without significant stenosis. The left ICA is tortuous proximally. The distal left ICA is normal.  The bone windows demonstrate multilevel disc disease with uncovertebral spurring from C3-4 through C6-7. No focal lytic or blastic lesions are present. The lung apices are clear.  Review of the MIP images confirms the above findings.  IMPRESSION: 1. Atherosclerotic changes at the carotid bifurcations bilaterally without significant stenosis. 2. ICA tortuosity is worse right than left without significant stenosis. 3. Focal narrowing of the distal left vertebral artery at the level of C2 and the dura with a small irregular vessel extending to the vertebrobasilar junction. 4. The basilar artery is intact. 5. Focal ectasia of the innominate artery measuring up to 21 mm just above the aortic arch. 6. Atherosclerotic changes within the cavernous carotid arteries bilaterally without significant stenosis. 7. Moderate distal small vessel disease in the MCA and PCA branch vessels. These results were called by telephone at the time of interpretation on 05/10/2014 at 12:12  pm to Dr. Roland Rack, who verbally acknowledged these results.   Electronically Signed   By: Lawrence Santiago M.D.   On: 05/10/2014 12:44   Ct Head Wo Contrast  05/10/2014   CLINICAL DATA:  Patient unresponsive.  EXAM: CT HEAD WITHOUT CONTRAST  TECHNIQUE: Contiguous axial images were obtained from the base of the skull through the vertex without intravenous contrast.  COMPARISON:  None.  FINDINGS: There is no evidence of acute intracranial abnormality including infarct, hemorrhage, mass lesion, mass effect, midline shift or abnormal extra-axial fluid collection. Mucous retention cysts or polyps are seen in the maxillary sinuses bilaterally. Mastoid air cells and middle ears are clear. The calvarium is intact.  IMPRESSION: No acute intracranial abnormality.  Mucous retention cysts or polyps in the maxillary sinuses.   Electronically Signed   By: Inge Rise M.D.   On: 05/10/2014 09:04  Ct Angio Neck W/cm &/or Wo/cm  05/10/2014   CLINICAL DATA:  INITIAL ENCOUNTER. CODE STROKE. Patient found unresponsive at 6 o'clock a.m. Last seen well at 1 o'clock a.m.  EXAM: CT ANGIOGRAPHY HEAD AND NECK  TECHNIQUE: Multidetector CT imaging of the head and neck was performed using the standard protocol during bolus administration of intravenous contrast. Multiplanar CT image reconstructions and MIPs were obtained to evaluate the vascular anatomy. Carotid stenosis measurements (when applicable) are obtained utilizing NASCET criteria, using the distal internal carotid diameter as the denominator.  CONTRAST:  46mL OMNIPAQUE IOHEXOL 350 MG/ML SOLN, 74mL OMNIPAQUE IOHEXOL 350 MG/ML SOLN  COMPARISON:  CT head without contrast 05/10/2014.  FINDINGS: CTA HEAD FINDINGS  The source images demonstrate no acute infarct. The basal ganglia are intact. The postcontrast images demonstrate no pathologic enhancement. The patient is intubated. An NG tube is in place.  Atherosclerotic changes are present within the distal internal  carotid arteries bilaterally without significant stenosis. The A1 and M1 segments are normal. The anterior communicating artery is patent. The bifurcations are intact. There is moderate attenuation of distal MCA branch vessels bilaterally.  The distal left vertebral artery is narrowed P on the dura. Right vertebral artery is intact. The left PICA origin is visualized and normal. The right AICA is dominant. The basilar artery is within normal limits. The left posterior cerebral artery originates from the basilar tip. The right posterior cerebral artery is of fetal type. There is moderate attenuation of PCA branch vessels bilaterally.  Review of the MIP images confirms the above findings.  CTA NECK FINDINGS  A 3 vessel arch configuration is present. Right lateral irregularity and focally ectasia is noted in the proximal innominate artery. The artery measures up to 21 mm in diameter. The proximal vessels are otherwise normal. Atherosclerotic calcifications are present in the aortic arch.  The vertebral artery is slightly dominant to the left. There is focal narrowing of the distal left vertebral artery at the level of C2-2.  The right common carotid artery demonstrates atherosclerotic changes at the bifurcation. There some calcific changes in the proximal right ICA without significant stenosis. The right ICA is tortuous without distal stenosis.  The left common carotid artery is within normal limits. Atherosclerotic changes are noted at the bifurcation and proximal left ICA without significant stenosis. The left ICA is tortuous proximally. The distal left ICA is normal.  The bone windows demonstrate multilevel disc disease with uncovertebral spurring from C3-4 through C6-7. No focal lytic or blastic lesions are present. The lung apices are clear.  Review of the MIP images confirms the above findings.  IMPRESSION: 1. Atherosclerotic changes at the carotid bifurcations bilaterally without significant stenosis. 2. ICA  tortuosity is worse right than left without significant stenosis. 3. Focal narrowing of the distal left vertebral artery at the level of C2 and the dura with a small irregular vessel extending to the vertebrobasilar junction. 4. The basilar artery is intact. 5. Focal ectasia of the innominate artery measuring up to 21 mm just above the aortic arch. 6. Atherosclerotic changes within the cavernous carotid arteries bilaterally without significant stenosis. 7. Moderate distal small vessel disease in the MCA and PCA branch vessels. These results were called by telephone at the time of interpretation on 05/10/2014 at 12:12 pm to Dr. Roland Rack, who verbally acknowledged these results.   Electronically Signed   By: Lawrence Santiago M.D.   On: 05/10/2014 12:44   Ct Angio Chest Pe W/cm &/or Wo Cm  05/10/2014  CLINICAL DATA:  Found on couch unresponsive. Shortness of breath for 3-4 days.  EXAM: CT ANGIOGRAPHY CHEST WITH CONTRAST  TECHNIQUE: Multidetector CT imaging of the chest was performed using the standard protocol during bolus administration of intravenous contrast. Multiplanar CT image reconstructions and MIPs were obtained to evaluate the vascular anatomy.  CONTRAST:  56mL OMNIPAQUE IOHEXOL 350 MG/ML SOLN, 61mL OMNIPAQUE IOHEXOL 350 MG/ML SOLN  COMPARISON:  None  FINDINGS: Mediastinum: The heart size appears normal. No pericardial effusion. The trachea appears patent. There is an endotracheal tube with tip above the carina. Nasogastric tube is identified within the esophagus. There is no mediastinal or hilar adenopathy identified. No supraclavicular or axillary adenopathy. There is calcified atherosclerotic disease involving the thoracic aorta. Calcified plaque is also noted within the LAD and RCA coronary arteries. Normal appearance of the pulmonary artery. No lobar or segmental filling defects identified to suggest pulmonary embolus.  Lungs/Pleura: There is no pleural effusion identified. Dependent changes  noted in the right lung base. No airspace consolidation. Left upper lobe lung nodule measures 8 mm.  Upper Abdomen: Incidental imaging through the upper abdomen is unremarkable.  Musculoskeletal: Review of the visualized bony structures is significant for thoracic kyphosis. There is multi level degenerative disc disease identified. No acute bone abnormality noted.  Review of the MIP images confirms the above findings.  IMPRESSION: 1. Exam is negative for acute pulmonary embolus. 2. Atherosclerotic disease including multi vessel coronary artery calcification. 3. Left lung nodule measures 8 mm If the patient is at high risk for bronchogenic carcinoma, follow-up chest CT at 3-47months is recommended. If the patient is at low risk for bronchogenic carcinoma, follow-up chest CT at 6-12 months is recommended. This recommendation follows the consensus statement: Guidelines for Management of Small Pulmonary Nodules Detected on CT Scans: A Statement from the [employer] as published in Radiology 2005; 237:395-400.   Electronically Signed   By: Kerby Moors M.D.   On: 05/10/2014 12:29   Dg Chest Port 1 View  05/11/2014   CLINICAL DATA:  Acute respiratory failure with difficult intubation, septic shock and aspiration pneumonia  EXAM: PORTABLE CHEST - 1 VIEW  COMPARISON:  05/10/2014  FINDINGS: The cardiac shadow is mildly enlarged but stable. An endotracheal tube is noted 2.6 cm above the carina. A nasogastric catheter and left jugular central line are again seen and stable. The lungs are well aerated with minimal bibasilar atelectatic changes. No focal confluent infiltrate is seen. No pneumothorax is noted. No bony abnormality is seen.  IMPRESSION: Tubes and lines as described above.  Bibasilar atelectatic changes.   Electronically Signed   By: Inez Catalina M.D.   On: 05/11/2014 08:02   Dg Chest Port 1 View  05/10/2014   CLINICAL DATA:  58 year old male found unresponsive in home by roommate at 0630 hr,  bradycardia on EMS arrival, intubated. Initial encounter.  EXAM: PORTABLE CHEST - 1 VIEW  COMPARISON:  None.  FINDINGS: Portable AP supine view at 0818 hr. Endotracheal tube in good position at the level the clavicles. Enteric tube courses to the abdomen, side hole the level of the gastric body.  Mildly low lung volumes. Normal cardiac size and mediastinal contours. Visualized tracheal air column is within normal limits. No pneumothorax, pulmonary edema, pleural effusion or confluent pulmonary opacity.  IMPRESSION: 1. ET tube and enteric tube in good position. 2. Mildly low lung volumes.  No acute cardiopulmonary abnormality.   Electronically Signed   By: Lars Pinks M.D.   On: 05/10/2014  08:34   Dg Chest Port 1v Same Day  05/10/2014   CLINICAL DATA:  Central catheter placement ; hypoxia  EXAM: PORTABLE CHEST - 1 VIEW SAME DAY  COMPARISON:  Study obtained earlier in the day  FINDINGS: Central catheter tip is in the left innominate vein. Endotracheal tube tip is 4.1 cm above the carina. Nasogastric tube tip and side port are in the stomach. No pneumothorax. There is no edema or consolidation. Heart is upper normal in size with pulmonary vascularity within normal limits. Thoracolumbar levoscoliosis is stable.  IMPRESSION: New central catheter tip is in the left innominate vein. Tube positions as described. No pneumothorax. No lung edema or consolidation.   Electronically Signed   By: Lowella Grip M.D.   On: 05/10/2014 10:48    Impression and Recommendations  1.  Respiratory failure, acute  2.  Altered mental status  3.  Septic shock  4.  Aspiration pneumonia  5.  Atrial fibrillation -The patient had rapid atrial fibrillation transiently in the emergency room. He has converted to sinus rhythm spontaneously. He had limited atrial fibrillation associated with his severe presentation. I would not plan on anticoagulation for this issue over time unless further atrial fibrillation is seen in the future.  6.   Bradycardia with decreased respirations on initial presentation - ? Initiating event - primary cardiac vs. Primary acute respiratory event 7.  Elevated troponin - This could be related to demand ischemia due to the severity of his presentation with acidemia and rapid atrial fibrillation but the troponin elevation is higher than typically seen with demand ischemia in the setting of normal renal function so this could also represent a NSTEMI. He has had several short bursts of WCT that could be VT but could also be atrial tach with aberration.  Of note he also had questionable seizure activity in the ER and EEG confirms right occipital area seizure focus but not formal note from Neuro.  Per the nurse taking care of the patient they are not convinced he had a seizure.  If he did the troponin could also be elevated from this.   Will await results of 2D echo.  Further workup pending results of echo. Enzymes are trending down.  Will talk with CCM about full dose Heparin until decision for cath made. - continue ASA - Replete potassium to keep K>4 - hold on BB at present since he was bradycardic on initial presenstation    Sueanne Margarita, MD  05/11/2014  9:47 AM

## 2014-05-11 NOTE — Progress Notes (Signed)
[facility] ADULT ICU REPLACEMENT PROTOCOL FOR AM LAB REPLACEMENT ONLY  The patient does apply for the [facility] Adult ICU Electrolyte Replacment Protocol based on the criteria listed below:   1. Is GFR >/= 40 ml/min? Yes.    Patient's GFR today is 81 2. Is urine output >/= 0.5 ml/kg/hr for the last 6 hours? Yes.   Patient's UOP is 0.9 ml/kg/hr 3. Is BUN < 60 mg/dL? Yes.    Patient's BUN today is 29 4. Abnormal electrolyte(s): K3.6,Mg1.5,Phos1.7 5. Ordered repletion with: Kcl,Mg,Phos 6. If a panic level lab has been reported, has the CCM MD in charge been notified? Yes.  .   Physician:  Zenon Mayo 05/11/2014 5:34 AM

## 2014-05-11 NOTE — Progress Notes (Signed)
Routine EEG completed at bedside. Results pending. 

## 2014-05-11 NOTE — Procedures (Signed)
History: 40 rolled male with a history of altered mental status of unclear etiology  Sedation: Propofol  Technique: This is a 17 channel routine scalp EEG performed at the bedside with bipolar and monopolar montages arranged in accordance to the international 10/20 system of electrode placement. One channel was dedicated to EKG recording.    Background: The background consists of irregular delta activity with occasional sleep spindles. Following stimulation, he has a clear posterior dominant rhythm of approximately 8 Hz. No sharp waves are seen  Photic stimulation: Physiologic driving is not performed  EEG Abnormalities: 1) generalized irregular slow activity  Clinical Interpretation: This EEG is consistent with a mild nonspecific generalized cerebral dysfunction (encephalopathy). There was no seizure or seizure predisposition recorded on this study.   Roland Rack, MD [facility] [phone]  If 7pm- 7am, please page neurology on call as listed in [place].

## 2014-05-11 NOTE — Progress Notes (Signed)
Echocardiogram 2D Echocardiogram has been performed.  Steward Sames 05/11/2014, 10:25 AM

## 2014-05-11 NOTE — Progress Notes (Signed)
Pt pulling off leads and mitts and BP cuff.  BP elevated.  Junkin Lacy MD notified. Orders received. Will continue to monitor.

## 2014-05-12 ENCOUNTER — Inpatient Hospital Stay (HOSPITAL_COMMUNITY): Payer: Medicaid Other

## 2014-05-12 DIAGNOSIS — G9340 Encephalopathy, unspecified: Secondary | ICD-10-CM

## 2014-05-12 LAB — BASIC METABOLIC PANEL
Anion gap: 5 (ref 5–15)
BUN: 22 mg/dL (ref 6–23)
CHLORIDE: 109 meq/L (ref 96–112)
CO2: 32 meq/L (ref 19–32)
Calcium: 8.6 mg/dL (ref 8.4–10.5)
Creatinine, Ser: 0.68 mg/dL (ref 0.50–1.35)
GFR calc Af Amer: >90 mL/min (ref >90)
GFR calc non Af Amer: >90 mL/min (ref >90)
GLUCOSE: 125 mg/dL — AB (ref 70–99)
POTASSIUM: 4.8 meq/L (ref 3.7–5.3)
SODIUM: 146 meq/L (ref 137–147)

## 2014-05-12 LAB — GLUCOSE, CAPILLARY
Glucose-Capillary: 104 mg/dL — ABNORMAL HIGH (ref 70–99)
Glucose-Capillary: 77 mg/dL (ref 70–99)
Glucose-Capillary: 78 mg/dL (ref 70–99)
Glucose-Capillary: 78 mg/dL (ref 70–99)
Glucose-Capillary: 86 mg/dL (ref 70–99)
Glucose-Capillary: 93 mg/dL (ref 70–99)

## 2014-05-12 LAB — CBC
HCT: 37.4 % — ABNORMAL LOW (ref 39.0–52.0)
HEMOGLOBIN: 11.1 g/dL — AB (ref 13.0–17.0)
MCH: 24.3 pg — ABNORMAL LOW (ref 26.0–34.0)
MCHC: 29.7 g/dL — ABNORMAL LOW (ref 30.0–36.0)
MCV: 82 fL (ref 78.0–100.0)
Platelets: 170 10*3/uL (ref 150–400)
RBC: 4.56 MIL/uL (ref 4.22–5.81)
RDW: 18.7 % — ABNORMAL HIGH (ref 11.5–15.5)
WBC: 11.4 10*3/uL — AB (ref 4.0–10.5)

## 2014-05-12 LAB — PHOSPHORUS: PHOSPHORUS: 3.3 mg/dL (ref 2.3–4.6)

## 2014-05-12 LAB — MAGNESIUM: Magnesium: 2.4 mg/dL (ref 1.5–2.5)

## 2014-05-12 MED ORDER — THIAMINE HCL 100 MG/ML IJ SOLN
100.0000 mg | Freq: Every day | INTRAMUSCULAR | Status: DC
  Administered 2014-05-12 – 2014-05-19 (×8): 100 mg via INTRAVENOUS
  Filled 2014-05-12 (×8): qty 1

## 2014-05-12 MED ORDER — ASPIRIN 300 MG RE SUPP
150.0000 mg | Freq: Every day | RECTAL | Status: DC
  Administered 2014-05-12 – 2014-05-18 (×5): 150 mg via RECTAL
  Filled 2014-05-12 (×8): qty 1

## 2014-05-12 MED ORDER — FOLIC ACID 5 MG/ML IJ SOLN
1.0000 mg | Freq: Every day | INTRAMUSCULAR | Status: DC
  Administered 2014-05-12 – 2014-05-19 (×8): 1 mg via INTRAVENOUS
  Filled 2014-05-12 (×10): qty 0.2

## 2014-05-12 MED ORDER — IPRATROPIUM-ALBUTEROL 0.5-2.5 (3) MG/3ML IN SOLN
3.0000 mL | RESPIRATORY_TRACT | Status: DC | PRN
  Administered 2014-05-13: 3 mL via RESPIRATORY_TRACT
  Filled 2014-05-12: qty 3

## 2014-05-12 MED ORDER — DEXMEDETOMIDINE HCL IN NACL 200 MCG/50ML IV SOLN: 0.4000 ug/kg/h | INTRAVENOUS | Status: DC

## 2014-05-12 NOTE — Progress Notes (Addendum)
PULMONARY / CRITICAL CARE MEDICINE   Name: Donald Hughes MRN: [id number] DOB: 10-09-1955    ADMISSION DATE:  05/10/2014   REFERRING MD :  EDP  CHIEF COMPLAINT:  Unresponsive  INITIAL PRESENTATION:  58 yo male smoker found unresponsive by roommate >> noted to have bradycardia and then A fib with RVR, and intubated for airway protection.  Transferred to [facility] for further assessment.  STUDIES:  10/01 EEG >> Rt occipital sharp wave, generalized slowing 10/01 CT head >> no acute findings 10/01 CT chest >> atherosclerosis, 8 mm Lt lung nodule 10/02 EEG >> generalized slowing 10/02 Echo >> EF 65 to 12%, grade 1 diastolic dysfx 87/86 MRI brain >> widespread restricted diffusion  SIGNIFICANT EVENTS: 10/1 found unresponsive, intubated and admitted and to ICU, neurology consulted, cardiology consulted 10/3 Failed swallow assessment  SUBJECTIVE: Alert and interactive, following commands and answering questions. Does not know where he is. Confused at times  VITAL SIGNS: Temp:  [96.6 F (35.9 C)-99 F (37.2 C)] 98.7 F (37.1 C) (10/03 0815) Pulse Rate:  [26-95] 62 (10/03 0815) Resp:  [15-32] 18 (10/03 0815) BP: (110-175)/(59-134) 130/75 mmHg (10/03 0815) SpO2:  [87 %-100 %] 97 % (10/03 0815) Weight:  [189 lb 2.5 oz (85.8 kg)] 189 lb 2.5 oz (85.8 kg) (10/03 0337)  INTAKE / OUTPUT:  Intake/Output Summary (Last 24 hours) at 05/12/14 1132 Last data filed at 05/12/14 0700  Gross per 24 hour  Intake 888.29 ml  Output    560 ml  Net 328.29 ml   PHYSICAL EXAMINATION: General: Easily arousable and following commands. Neuro: Moving all ext to command, HEENT:  /AT, pupils reactive, MMM, neck supple. Voice is hoarse Cardiovascular:  RRR, Nl S1/S2, -M/R/G. Lungs:  Coarse BS diffusely. Abdomen:  Soft, NT, ND and +BS. Musculoskeletal:  Edema and -tenderness. Skin:  Intact  LABS:  CBC  Recent Labs Lab 05/10/14 1130 05/11/14 0320 05/12/14 0304  WBC 14.9* 10.9* 11.4*  HGB 11.2*  11.2* 11.1*  HCT 38.1* 36.0* 37.4*  PLT 183 169 170   Coag's  Recent Labs Lab 05/10/14 1130  APTT 27  INR 1.37   BMET  Recent Labs Lab 05/10/14 2159 05/11/14 0320 05/12/14 0304  NA 147 145 146  K 3.6* 3.6* 4.8  CL 108 107 109  CO2 25 25 32  BUN 28* 29* 22  CREATININE 1.07 1.01 0.68  GLUCOSE 87 110* 125*   Electrolytes  Recent Labs Lab 05/10/14 1309 05/10/14 2159 05/11/14 0320 05/11/14 2128 05/12/14 0304  CALCIUM  --  8.2* 8.3*  --  8.6  MG 1.5 1.5 1.5 2.7* 2.4  PHOS 2.1*  --  1.7*  --  3.3   Sepsis Markers  Recent Labs Lab 05/10/14 0809 05/11/14 0320  LATICACIDVEN 5.6* 1.2   ABG  Recent Labs Lab 05/10/14 0835 05/10/14 1105  PHART 7.103* 7.256*  PCO2ART 91.7* 66.9*  PO2ART 299.0* 156.0*   Liver Enzymes  Recent Labs Lab 05/10/14 1130  AST 64*  ALT 31  ALKPHOS 72  BILITOT 0.3  ALBUMIN 2.5*   Cardiac Enzymes  Recent Labs Lab 05/10/14 0937 05/10/14 1130 05/10/14 1615 05/10/14 2230  TROPONINI 0.56* 2.41* 7.08* 5.51*  PROBNP  --  1793.0*  --   --    Glucose  Recent Labs Lab 05/11/14 1206 05/11/14 1616 05/11/14 2120 05/11/14 2323 05/12/14 0336 05/12/14 0834  GLUCAP 100* 123* 97 95 104* 78    Imaging Dg Chest Port 1 View  05/11/2014   CLINICAL DATA:  Acute  respiratory failure with difficult intubation, septic shock and aspiration pneumonia  EXAM: PORTABLE CHEST - 1 VIEW  COMPARISON:  05/10/2014  FINDINGS: The cardiac shadow is mildly enlarged but stable. An endotracheal tube is noted 2.6 cm above the carina. A nasogastric catheter and left jugular central line are again seen and stable. The lungs are well aerated with minimal bibasilar atelectatic changes. No focal confluent infiltrate is seen. No pneumothorax is noted. No bony abnormality is seen.  IMPRESSION: Tubes and lines as described above.  Bibasilar atelectatic changes.   Electronically Signed   By: Inez Catalina M.D.   On: 05/11/2014 08:02     ASSESSMENT /  PLAN:  PULMONARY OETT 10/1>>>10/2 A: Acute respiratory failure 2nd to airway protection. Tobacco abuse with hx of COPD. Chronic hoarseness. P:   PRN BD's Continue decadron Might need ENT evaluation  CARDIOVASCULAR A: A fib with RVR with elevated troponin from demand ischemia. CAD on CT scan. Hx of HTN. P:  Per cardiology ASA PR until able to swallow  RENAL A: AKI, anion gap acidosis >> resolved. Urine retention noted 10/03. P:   Monitor renal fx, urine outpt, electrolytes Insert foley 10/03  GASTROINTESTINAL A: Dysphagia. P:   NPO F/u with speech therapy 10/04 Protonix for SUP  HEMATOLOGIC A: No active issue. P:  SQ heparin  F/u cbc   INFECTIOUS A: No evidence for infection. P: D/c Abx 10/03  Sputum 10/01 >>  Blood 10/01 >>   ENDOCRINE A: Hyperglycemia - no known hx DM P:   D/c SSI 10/03  NEUROLOGIC A: Acute encephalopathy ? Cause >> Abnl MRI brain 10/03 and possible seizure focus on EEG. P:   AED's per neurology Wean off precedex as tolerated  Family updated: 10/3 no family at bedside  TODAY'S SUMMARY:   Awake and interactive. Poor STM. NAD. Plan to de intensify his care. He will need neuro/cards follow ups. Try to wean precedex, start thiamine and folic acid as precaution since he may have has seizure from wd.  Richardson Landry Minor ACNP Maryanna Shape PCCM Pager [phone] till 3 pm If no answer page [phone] 05/12/2014, 11:50 AM  Reviewed above, examined.  Abnormal MRI as detailed above.  Will need to get further hx from family/roommate.  Wean off precedex as tolerated.  Will need f/u with speech and might need ENT assessment.  Chesley Mires, MD [facility] Pulmonary/Critical Care 05/12/2014, 2:51 PM Pager:  [phone] After 3pm call: [phone]

## 2014-05-12 NOTE — Evaluation (Signed)
SLP Cancellation Note  Patient Details Name: Donald Hughes MRN: [id number] DOB: [birth year]   Cancelled treatment:       Reason Eval/Treat Not Completed: Patient at procedure or test/unavailable   Luanna Salk, [facility] SLP [phone]

## 2014-05-12 NOTE — Progress Notes (Signed)
Subjective: Extubated, continues to be encphalopathic.   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 He had some irritability on an initial EEG, but I suspect that this was a result of the hypoxia and am not sure he will need long-term antiepileptics. One consideration with bilateral basal ganglia changes would be carbon monoxide poisoning. His tremor is likely post ischemic.  Recommendations: 1) continue Keppra for now 2) will likely need speech cognitive evaluation 3) carboxy hemoglobin 4) will continue to follow  Roland Rack, MD [facility] [phone]  If 7pm- 7am, please page neurology on call as listed in [place].

## 2014-05-12 NOTE — Evaluation (Signed)
Clinical/Bedside Swallow Evaluation Patient Details  Name: Donald Hughes MRN: [id number] Date of Birth: October 19, 1955  Today's Date: 05/12/2014 Time: 1215-1241 SLP Time Calculation (min): 26 min  Past Medical History:  Past Medical History  Diagnosis Date  . Difficult intubation 05/10/14    laryngeal edema, difficult DL and video laryngoscopy   Past Surgical History: History reviewed. No pertinent past surgical history. HPI:  58 yo male adm to [facility] after being found unresponsive, he required intubation in the ED and intubation was difficult per notes.  Pt extubated and swallow evaluation ordered.  Stridorous and hoarseness documented  per MD note that may require ENT referral in future.   Difficult extubation ? due to COPD per MD note.  Per MRI 10/3, pt with widespread restricted diffusion predominately in globus pallidus that may be consistent with hypoxia, ischemic event.  CXR negative for acute changes 05/12/14.     Assessment / Plan / Recommendation Clinical Impression  Pt presents with multiple factors concerning for pharyngeal dysphagia and aspiration/laryngeal penetration.  No focal CN deficits noted but given pt is aphonic and was difficult to intubate which raise concern for pharyngeal/laryngeal edema impacting sensorimotor function.   SLP adminstered single ice chip only - followed by delayed swallow and pt coughing with expectoration of viscous yellow tinged secretions.  Instumental evaluation indicated for this pt with neurological change prior to intiating any diet.  SLP educated pt/family to recommendations and clinical reasoning with all verbalizing understanding with teach back for reinforcement.    MD please order MBS if/when you agree, at this point, he may not be appropriate for po or MBS due to level of dysphagia.  SLP to follow up next date to determine readiness for instrumental testing if MBS not ordered early this pm.  Please note, pt's brother present and reports pt had  issues with hoarseness for several months prior to this event.      Aspiration Risk  Severe    Diet Recommendation NPO   Medication Administration: Via alternative means    Other  Recommendations Recommended Consults: MBS Oral Care Recommendations: Oral care Q4 per protocol   Follow Up Recommendations   (tbd)    Frequency and Duration min 1 x/week  2 weeks   Pertinent Vitals/Pain Afebrile, decreased- productive cough     Swallow Study Prior Functional Status   see HHX    General Date of Onset: 05/12/14 HPI: 58 yo male adm to [facility] after being found unresponsive, he required intubation in the ED and intubation was difficult per notes.  Pt extubated and swallow evaluation ordered.  Stridorous and hoarseness documented  per MD note that may require ENT referral in future.   Difficult extubation ? due to COPD per MD note.  Per MRI 10/3, pt with widespread restricted diffusion predominately in globus pallidus that may be consistent with hypoxia, ischemic event.  CXR negative for acute changes 05/12/14.   Type of Study: Bedside swallow evaluation Diet Prior to this Study: NPO Temperature Spikes Noted: No Respiratory Status: Nasal cannula History of Recent Intubation: Yes Length of Intubations (days): 2 days Date extubated: 05/11/14 Behavior/Cognition: Alert;Cooperative Oral Cavity - Dentition: Poor condition;Missing dentition Self-Feeding Abilities: Needs assist Patient Positioning: Upright in bed Baseline Vocal Quality: Low vocal intensity;Hoarse (nearly aphonic) Volitional Cough: Strong;Congested (productive to viscous yellow tinged/white secretions, congested ) Volitional Swallow: Able to elicit (with effort)    Oral/Motor/Sensory Function Overall Oral Motor/Sensory Function: Appears within functional limits for tasks assessed (generalized weakness, no focal CN deficits)  Ice Chips Ice chips: Impaired Presentation: Spoon Oral Phase Impairments: Reduced lingual  movement/coordination;Impaired anterior to posterior transit Pharyngeal Phase Impairments: Suspected delayed Swallow;Decreased hyoid-laryngeal movement;Cough - Delayed;Throat Clearing - Immediate   Thin Liquid Thin Liquid: Not tested    Nectar Thick Nectar Thick Liquid: Not tested   Honey Thick Honey Thick Liquid: Not tested   Puree Puree: Not tested   Solid   GO    Solid: Not tested       Luanna Salk, [facility] SLP [phone]

## 2014-05-12 NOTE — Progress Notes (Signed)
Patient ID: JEANLUC WEGMAN, male   DOB: 1955/09/18, 58 y.o.   MRN: [id number]     Subjective:    Patient without complaints this AM  Objective:   Temp:  [96.6 F (35.9 C)-99 F (37.2 C)] 98.7 F (37.1 C) (10/03 0815) Pulse Rate:  [26-95] 62 (10/03 0815) Resp:  [15-32] 18 (10/03 0815) BP: (110-175)/(59-134) 130/75 mmHg (10/03 0815) SpO2:  [87 %-100 %] 97 % (10/03 0815) Weight:  [189 lb 2.5 oz (85.8 kg)] 189 lb 2.5 oz (85.8 kg) (10/03 0337)    Filed Weights   05/10/14 1242 05/11/14 0349 05/12/14 0337  Weight: 187 lb 6.3 oz (85 kg) 186 lb 8.2 oz (84.6 kg) 189 lb 2.5 oz (85.8 kg)    Intake/Output Summary (Last 24 hours) at 05/12/14 1153 Last data filed at 05/12/14 0700  Gross per 24 hour  Intake 888.29 ml  Output    560 ml  Net 328.29 ml    Telemetry: NSR  Exam:  General: NAD  Resp: Clear anteriorally  Cardiac: RRR, no m/r/g, no JVD,   GI: abdomen soft, NT, ND  MSK: no LE edema   Lab Results:  Basic Metabolic Panel:  Recent Labs Lab 05/10/14 2159 05/11/14 0320 05/11/14 2128 05/12/14 0304  NA 147 145  --  146  K 3.6* 3.6*  --  4.8  CL 108 107  --  109  CO2 25 25  --  32  GLUCOSE 87 110*  --  125*  BUN 28* 29*  --  22  CREATININE 1.07 1.01  --  0.68  CALCIUM 8.2* 8.3*  --  8.6  MG 1.5 1.5 2.7* 2.4    Liver Function Tests:  Recent Labs Lab 05/10/14 1130  AST 64*  ALT 31  ALKPHOS 72  BILITOT 0.3  PROT 5.9*  ALBUMIN 2.5*    CBC:  Recent Labs Lab 05/10/14 1130 05/11/14 0320 05/12/14 0304  WBC 14.9* 10.9* 11.4*  HGB 11.2* 11.2* 11.1*  HCT 38.1* 36.0* 37.4*  MCV 81.8 77.9* 82.0  PLT 183 169 170    Cardiac Enzymes:  Recent Labs Lab 05/10/14 0937 05/10/14 1130 05/10/14 1615 05/10/14 2159 05/10/14 2230  CKTOTAL 371*  --   --  686*  --   CKMB  --   --   --  19.6*  --   TROPONINI 0.56* 2.41* 7.08*  --  5.51*    BNP:  Recent Labs  05/10/14 1130  PROBNP 1793.0*    Coagulation:  Recent Labs Lab 05/10/14 1130  INR  1.37    ECG:   Medications:   Scheduled Medications: . antiseptic oral rinse  7 mL Mouth Rinse QID  . aspirin EC  81 mg Oral Daily  . chlorhexidine  15 mL Mouth Rinse BID  . dexamethasone  4 mg Intravenous Q12H  . heparin subcutaneous  5,000 Units Subcutaneous 3 times per day  . Influenza vac split quadrivalent PF  0.5 mL Intramuscular Tomorrow-1000  . insulin aspart  0-15 Units Subcutaneous 6 times per day  . ipratropium-albuterol  3 mL Nebulization Q4H  . levETIRAcetam  500 mg Intravenous Q12H  . pantoprazole (PROTONIX) IV  40 mg Intravenous Q24H  . piperacillin-tazobactam  3.375 g Intravenous Once  . piperacillin-tazobactam (ZOSYN)  IV  3.375 g Intravenous Q8H  . vancomycin  1,000 mg Intravenous Q12H     Infusions: . dexmedetomidine 0.1 mcg/kg/hr (05/12/14 0851)     PRN Medications:  labetalol     Assessment/Plan  1. Afib - isolated episode upon presentation in the setting of severe systemic illness, converted back to NSR.  - we have not committed to long term anticoag at this point, follow for afib recurrence. None noted on tele review   2. Elevated troponin - peak trop of 7 in setting of acute resp failure, septic shock, aspiration pneumonia, and initialyl afib with RVR. - echo 05/11/14 LVEF 65-70% and vigorous, grade I diastolic dysfunction. EKG without specific ischemic changes. CT PE no PE, note atherosclerotic disease.  - based on findings likely demand ischemia in setting of  chronic obstructive CAD, presentation not consistent with acute occlusive coronary disease  - can consider non-invasive ischemic testing once acute issues have resolved, either inpatient or outpatient.   3. AMS - followed by neurology  4. CAD - noted coronary calcifications on CT scan, unclear functionality - continue ASA. Check lipids, likely will need statin. Will need functional assessment of CAD once acute issues improved.     Carlyle Dolly, M.D.

## 2014-05-12 NOTE — Progress Notes (Signed)
ANTIBIOTIC CONSULT NOTE - FOLLOW UP  Pharmacy Consult for vancomycin and zosyn Indication: rule out sepsis  Not on File  Patient Measurements: Height: 5\' 5"  (165.1 cm) Weight: 189 lb 2.5 oz (85.8 kg) IBW/kg (Calculated) : 61.5   Vital Signs: Temp: 98.7 F (37.1 C) (10/03 0815) Temp Source: Oral (10/03 0815) BP: 130/75 mmHg (10/03 0815) Pulse Rate: 62 (10/03 0815) Intake/Output from previous day: 10/02 0701 - 10/03 0700 In: 1407 [I.V.:477; IV Piggyback:750] Out: 720 [Urine:720] Intake/Output from this shift:    Labs:  Recent Labs  05/10/14 1130 05/10/14 2159 05/11/14 0320 05/12/14 0304  WBC 14.9*  --  10.9* 11.4*  HGB 11.2*  --  11.2* 11.1*  PLT 183  --  169 170  CREATININE 1.23 1.07 1.01 0.68   Estimated Creatinine Clearance: 103.8 ml/min (by C-G formula based on Cr of 0.68). No results found for this basename: VANCOTROUGH, Corlis Leak, VANCORANDOM, [place], [place], [place], [place], [place], TOBRARND, AMIKACINPEAK, AMIKACINTROU, AMIKACIN,  in the last 72 hours   Microbiology: Recent Results (from the past 720 hour(s))  URINE CULTURE     Status: None   Collection Time    05/10/14  8:17 AM      Result Value Ref Range Status   Specimen Description URINE, CATHETERIZED   Final   Special Requests NONE   Final   Culture  Setup Time     Final   Value: 05/10/2014 12:58     Performed at [place]     Final   Value: NO GROWTH     Performed at [facility]   Culture     Final   Value: NO GROWTH     Performed at [facility]   Report Status 05/11/2014 FINAL   Final  CULTURE, BLOOD (ROUTINE X 2)     Status: None   Collection Time    05/10/14  8:21 AM      Result Value Ref Range Status   Specimen Description BLOOD LEFT WRIST   Final   Special Requests BOTTLES DRAWN AEROBIC ONLY 2MLS   Final   Culture  Setup Time     Final   Value: 05/10/2014 12:55     Performed at [facility]   Culture     Final   Value:        BLOOD CULTURE RECEIVED NO GROWTH TO DATE CULTURE WILL BE HELD FOR 5 DAYS BEFORE ISSUING A FINAL NEGATIVE REPORT     Performed at [facility]   Report Status PENDING   Incomplete  CULTURE, BLOOD (ROUTINE X 2)     Status: None   Collection Time    05/10/14  8:35 AM      Result Value Ref Range Status   Specimen Description BLOOD RIGHT FOREARM   Final   Special Requests BOTTLES DRAWN AEROBIC ONLY 3MLS   Final   Culture  Setup Time     Final   Value: 05/10/2014 12:54     Performed at [facility]   Culture     Final   Value:        BLOOD CULTURE RECEIVED NO GROWTH TO DATE CULTURE WILL BE HELD FOR 5 DAYS BEFORE ISSUING A FINAL NEGATIVE REPORT     Performed at [facility]   Report Status PENDING   Incomplete  CULTURE, RESPIRATORY (NON-EXPECTORATED)     Status: None   Collection Time    05/10/14 11:30 AM  Result Value Ref Range Status   Specimen Description TRACHEAL ASPIRATE   Final   Special Requests NONE   Final   Gram Stain     Final   Value: FEW WBC PRESENT,BOTH PMN AND MONONUCLEAR     RARE SQUAMOUS EPITHELIAL CELLS PRESENT     NO ORGANISMS SEEN     Performed at [facility]   Culture     Final   Value: Culture reincubated for better growth     Performed at [facility]   Report Status PENDING   Incomplete  MRSA PCR SCREENING     Status: None   Collection Time    05/10/14 12:54 PM      Result Value Ref Range Status   MRSA by PCR NEGATIVE  NEGATIVE Final   Comment:            The GeneXpert MRSA Assay (FDA     approved for NASAL specimens     only), is one component of a     comprehensive MRSA colonization     surveillance program. It is not     intended to diagnose MRSA     infection nor to guide or     monitor treatment for     MRSA infections.    Anti-infectives   Start     Dose/Rate Route Frequency Ordered Stop   05/11/14 0000  vancomycin (VANCOCIN) IVPB 1000 mg/200 mL premix     1,000 mg 200 mL/hr over 60  Minutes Intravenous Every 12 hours 05/10/14 1050     05/10/14 2000  piperacillin-tazobactam (ZOSYN) IVPB 3.375 g     3.375 g 12.5 mL/hr over 240 Minutes Intravenous Every 8 hours 05/10/14 1050     05/10/14 0930  piperacillin-tazobactam (ZOSYN) IVPB 3.375 g     3.375 g 100 mL/hr over 30 Minutes Intravenous  Once 05/10/14 0917     05/10/14 0915  vancomycin (VANCOCIN) 1,500 mg in sodium chloride 0.9 % 500 mL IVPB     1,500 mg 250 mL/hr over 120 Minutes Intravenous  Once 05/10/14 0911 05/10/14 1246   05/10/14 0915  piperacillin-tazobactam (ZOSYN) IVPB 2.25 g  Status:  Discontinued     2.25 g 100 mL/hr over 30 Minutes Intravenous  Once 05/10/14 0911 05/10/14 0916      Assessment: Vanc/Zosyn D#3 for rule out sepsis.  WBC 14.9>10.9>11.4(new decadron for stridor.)  10/2- CXR negative for PNA, AF w/ 1 hypothermic x in past 24 hours, creat .68, wt 84.6 Per CCM plan is to narrow abx as quickly as able.     Vanc 10/1 >> Zosyn 10/1 >>  10/1 Ucs NGF 10/1 TA  10/1 BCx2 >>ngtd   Goal of Therapy:  Vancomycin trough level 15-20 mcg/ml  Plan:  -continue vancomycin 1 gm IV q12h -continue zosyn 3.375 gm IV q8hr, 4 hr infusion - no vanc trough yet as expect abx to be narrowed soon  Eudelia Bunch, Pharm.D. [phone] 05/12/2014 10:58 AM

## 2014-05-13 DIAGNOSIS — I248 Other forms of acute ischemic heart disease: Secondary | ICD-10-CM

## 2014-05-13 DIAGNOSIS — G9340 Encephalopathy, unspecified: Secondary | ICD-10-CM

## 2014-05-13 LAB — LIPID PANEL
Cholesterol: 117 mg/dL (ref 0–200)
HDL: 26 mg/dL — AB (ref >39)
LDL Cholesterol: 61 mg/dL (ref 0–99)
TRIGLYCERIDES: 148 mg/dL (ref <150)
Total CHOL/HDL Ratio: 4.5 RATIO
VLDL: 30 mg/dL (ref 0–40)

## 2014-05-13 LAB — BASIC METABOLIC PANEL
Anion gap: 7 (ref 5–15)
BUN: 21 mg/dL (ref 6–23)
CO2: 34 mEq/L — ABNORMAL HIGH (ref 19–32)
CREATININE: 0.67 mg/dL (ref 0.50–1.35)
Calcium: 9 mg/dL (ref 8.4–10.5)
Chloride: 103 mEq/L (ref 96–112)
GFR calc Af Amer: >90 mL/min (ref >90)
GFR calc non Af Amer: >90 mL/min (ref >90)
GLUCOSE: 84 mg/dL (ref 70–99)
Potassium: 4.6 mEq/L (ref 3.7–5.3)
Sodium: 144 mEq/L (ref 137–147)

## 2014-05-13 LAB — GLUCOSE, CAPILLARY
GLUCOSE-CAPILLARY: 83 mg/dL (ref 70–99)
GLUCOSE-CAPILLARY: 89 mg/dL (ref 70–99)
Glucose-Capillary: 86 mg/dL (ref 70–99)
Glucose-Capillary: 80 mg/dL (ref 70–99)
Glucose-Capillary: 93 mg/dL (ref 70–99)
Glucose-Capillary: 86 mg/dL (ref 70–99)

## 2014-05-13 LAB — CBC
HEMATOCRIT: 39.8 % (ref 39.0–52.0)
Hemoglobin: 11.6 g/dL — ABNORMAL LOW (ref 13.0–17.0)
MCH: 23.9 pg — ABNORMAL LOW (ref 26.0–34.0)
MCHC: 29.1 g/dL — ABNORMAL LOW (ref 30.0–36.0)
MCV: 81.9 fL (ref 78.0–100.0)
Platelets: 186 10*3/uL (ref 150–400)
RBC: 4.86 MIL/uL (ref 4.22–5.81)
RDW: 18.5 % — AB (ref 11.5–15.5)
WBC: 12 10*3/uL — ABNORMAL HIGH (ref 4.0–10.5)

## 2014-05-13 LAB — CULTURE, RESPIRATORY W GRAM STAIN

## 2014-05-13 LAB — CULTURE, RESPIRATORY

## 2014-05-13 MED ORDER — ATORVASTATIN CALCIUM 80 MG PO TABS
80.0000 mg | ORAL_TABLET | Freq: Every day | ORAL | Status: DC
Start: 2014-05-13 — End: 2014-05-13
  Filled 2014-05-13: qty 1

## 2014-05-13 MED ORDER — HYDRALAZINE HCL 20 MG/ML IJ SOLN
10.0000 mg | INTRAMUSCULAR | Status: DC | PRN
  Administered 2014-05-13 – 2014-05-15 (×2): 10 mg via INTRAVENOUS
  Filled 2014-05-13 (×3): qty 1

## 2014-05-13 MED ORDER — LEVETIRACETAM IN NACL 500 MG/100ML IV SOLN
500.0000 mg | Freq: Two times a day (BID) | INTRAVENOUS | Status: AC
Start: 2014-05-13 — End: 2014-05-16
  Administered 2014-05-13 – 2014-05-16 (×8): 500 mg via INTRAVENOUS
  Filled 2014-05-13 (×8): qty 100

## 2014-05-13 NOTE — Progress Notes (Signed)
Subjective: Extubated, continues to be encphalopathic.   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 He had some irritability on an initial EEG, but I suspect that this was a result of the hypoxia and do not feel he will need long-term antiepileptics. There is no evidence that he has had a seizure. One consideration with bilateral basal ganglia changes would be carbon monoxide poisoning, but I feel this is relatively unlikely. I tried contacting family to inform them that they need a CO monitor, but numbers in chart do not work.  His tremor is likely post ischemic.  I would continue keppra for 7 days from event(stop 10/08). I suspect tha the will have some cognitive issues from teh hypoxic event.   Recommendations: 1) continue Keppra for now 2) will likely need speech cognitive evaluation 3) carboxy hemoglobin 4) No further recommendations at this time, he will need therapy evaluations. Neurology will sign off.   Roland Rack, MD [facility] [phone]  If 7pm- 7am, please page neurology on call as listed in [place].

## 2014-05-13 NOTE — Evaluation (Signed)
Physical Therapy Evaluation Patient Details Name: Donald Hughes MRN: [id number] DOB: 11-21-1955 Today's Date: 05/13/2014   History of Present Illness  58 yo male smoker found unresponsive by roommate >> noted to have bradycardia and then A fib with RVR, and intubated for airway protection.VDRF 10/1-10/2 MRI=Widespread areas of restricted diffusion affecting primarily the  Clinical Impression  Pt moving well, pleasant and generally aware but disoriented to time. Pt with good strength and mobility throughout although limited by HTN with mobility currently. Pt will benefit from acute therapy to maximize mobility, strength, function and gait to decrease burden of care with anticipation of quick recovery.     Follow Up Recommendations Home health PT (pending pt progression)    Equipment Recommendations  None recommended by PT    Recommendations for Other Services       Precautions / Restrictions Precautions Precautions: Fall Restrictions Weight Bearing Restrictions: No      Mobility  Bed Mobility Overal bed mobility: Needs Assistance Bed Mobility: Supine to Sit     Supine to sit: Supervision     General bed mobility comments: increased time, cues for sequence as pt initially struggling to transfer  Transfers Overall transfer level: Needs assistance Equipment used: 1 person hand held assist Transfers: Sit to/from Stand;Stand Pivot Transfers Sit to Stand: Supervision Stand pivot transfers: Supervision       General transfer comment: supervision for safety. limited mobility secondary to HTN  Ambulation/Gait Ambulation/Gait assistance:  (unable secondary to BP at this time)              Science writer    Modified Rankin (Stroke Patients Only)       Balance Overall balance assessment: Needs assistance   Sitting balance-Leahy Scale: Good       Standing balance-Leahy Scale: Fair                                Pertinent Vitals/Pain Pain Assessment: No/denies pain BP 165/82 lying BP 193/103 sitting EOB x 2 assessments End of session in chair 163/85    Home Living Family/patient expects to be discharged to:: Private residence Living Arrangements: Spouse/significant other Available Help at Discharge: Friend(s);Available 24 hours/day Type of Home: House Home Access: Stairs to enter   [employer] of Steps: 2 Home Layout: One level Home Equipment: None      Prior Function Level of Independence: Independent         Comments: Pt was working fulltime with sporting Secondary school teacher        Extremity/Trunk Assessment   Upper Extremity Assessment: Overall WFL for tasks assessed           Lower Extremity Assessment: Overall WFL for tasks assessed (4+/5 all myotomes bil)      Cervical / Trunk Assessment: Normal  Communication   Communication: No difficulties  Cognition Arousal/Alertness: Awake/alert Behavior During Therapy: WFL for tasks assessed/performed Overall Cognitive Status: Impaired/Different from baseline Area of Impairment: Memory;Orientation Orientation Level: Time             General Comments: pt unable to spell "world" accurately forward or backward, aware of president, how to call 911 and could count backward from 20    General Comments      Exercises        Assessment/Plan    PT Assessment Patient needs continued  PT services  PT Diagnosis Difficulty walking   PT Problem List Decreased activity tolerance;Decreased balance;Decreased mobility;Cardiopulmonary status limiting activity  PT Treatment Interventions Gait training;Balance training;Functional mobility training;Therapeutic activities;Therapeutic exercise;Patient/family education   PT Goals (Current goals can be found in the Care Plan section) Acute Rehab PT Goals Patient Stated Goal: return to work PT Goal Formulation: With patient Time For Goal  Achievement: 05/27/14 Potential to Achieve Goals: Good    Frequency Min 3X/week   Barriers to discharge Decreased caregiver support girlfriend with back issues but can provide min assist    Co-evaluation               End of Session   Activity Tolerance: Patient tolerated treatment well Patient left: in chair;with call bell/phone within reach Nurse Communication: Mobility status         Time: 1033-1050 PT Time Calculation (min): 17 min   Charges:   PT Evaluation $Initial PT Evaluation Tier I: 1 Procedure PT Treatments $Therapeutic Activity: 8-22 mins   PT G CodesMelford Aase 05/13/2014, 11:01 AM  Elwyn Reach, [place]

## 2014-05-13 NOTE — Progress Notes (Signed)
Patient ID: JW COVIN, male   DOB: 1955/09/13, 58 y.o.   MRN: [id number]    Subjective:    No SOB, no chest pain.    Objective:   Temp:  [97.7 F (36.5 C)-98.6 F (37 C)] 97.7 F (36.5 C) (10/04 0715) Pulse Rate:  [49-142] 63 (10/04 0800) Resp:  [13-29] 17 (10/04 0900) BP: (116-203)/(64-159) 154/80 mmHg (10/04 0900) SpO2:  [92 %-100 %] 100 % (10/04 0900)    Filed Weights   05/10/14 1242 05/11/14 0349 05/12/14 0337  Weight: 187 lb 6.3 oz (85 kg) 186 lb 8.2 oz (84.6 kg) 189 lb 2.5 oz (85.8 kg)    Intake/Output Summary (Last 24 hours) at 05/13/14 0935 Last data filed at 05/13/14 0800  Gross per 24 hour  Intake  262.6 ml  Output   1775 ml  Net -1512.4 ml    Telemetry: SR, PACs, short runs of atach. Episodes of sinus bradycardia. Currently SR 70s  Exam:  General: NAD  Resp: CTAB  Cardiac: RRR, no m/r/g, no JVD  GI: abdomen soft, NT, ND  MSK: no LE edema  Neuro: no focal deficits  Psych: appropriate affect  Lab Results:  Basic Metabolic Panel:  Recent Labs Lab 05/11/14 0320 05/11/14 2128 05/12/14 0304 05/13/14 0215  NA 145  --  146 144  K 3.6*  --  4.8 4.6  CL 107  --  109 103  CO2 25  --  32 34*  GLUCOSE 110*  --  125* 84  BUN 29*  --  22 21  CREATININE 1.01  --  0.68 0.67  CALCIUM 8.3*  --  8.6 9.0  MG 1.5 2.7* 2.4  --     Liver Function Tests:  Recent Labs Lab 05/10/14 1130  AST 64*  ALT 31  ALKPHOS 72  BILITOT 0.3  PROT 5.9*  ALBUMIN 2.5*    CBC:  Recent Labs Lab 05/11/14 0320 05/12/14 0304 05/13/14 0215  WBC 10.9* 11.4* 12.0*  HGB 11.2* 11.1* 11.6*  HCT 36.0* 37.4* 39.8  MCV 77.9* 82.0 81.9  PLT 169 170 186    Cardiac Enzymes:  Recent Labs Lab 05/10/14 0937 05/10/14 1130 05/10/14 1615 05/10/14 2159 05/10/14 2230  CKTOTAL 371*  --   --  686*  --   CKMB  --   --   --  19.6*  --   TROPONINI 0.56* 2.41* 7.08*  --  5.51*    BNP:  Recent Labs  05/10/14 1130  PROBNP 1793.0*    Coagulation:  Recent  Labs Lab 05/10/14 1130  INR 1.37    ECG:   Medications:   Scheduled Medications: . antiseptic oral rinse  7 mL Mouth Rinse QID  . aspirin  150 mg Rectal Daily  . chlorhexidine  15 mL Mouth Rinse BID  . folic acid  1 mg Intravenous Daily  . heparin subcutaneous  5,000 Units Subcutaneous 3 times per day  . Influenza vac split quadrivalent PF  0.5 mL Intramuscular Tomorrow-1000  . insulin aspart  0-15 Units Subcutaneous 6 times per day  . levETIRAcetam  500 mg Intravenous Q12H  . thiamine IV  100 mg Intravenous Daily     Infusions:     PRN Medications:  hydrALAZINE, ipratropium-albuterol, labetalol   05/11/14 Echo Study Conclusions  - Left ventricle: The cavity size was mildly dilated. Wall thickness was normal. Systolic function was vigorous. The estimated ejection fraction was in the range of 65% to 70%. Doppler parameters are consistent with abnormal left  ventricular relaxation (grade 1 diastolic dysfunction). - Left atrium: The atrium was mildly dilated.     Assessment/Plan    1. Afib  - isolated episode upon presentation in the setting of severe systemic illness, converted back to NSR.  - we have not committed to long term anticoag at this point, follow for afib recurrence. None noted on tele review over the weekend  2. Demand ischemia  - peak trop of 7 in setting of acute resp failure, septic shock, aspiration pneumonia, and initially afib with RVR.  - echo 05/11/14 LVEF 65-70% and vigorous, grade I diastolic dysfunction. EKG without specific ischemic changes. CT PE no PE, noted coronary atherosclerotic disease.  - based on findings likely demand ischemia in setting of chronic obstructive CAD, presentation not consistent with acute occlusive coronary disease  - can consider non-invasive ischemic testing once acute issues have resolved, either inpatient or outpatient.   3. AMS  - followed by neurology   4. CAD  - noted coronary calcifications on CT scan,  unclear functionality  - continue ASA. Fairly normal lipid panel however in the setting of elevated trop with CAD on CT scan would benefit from statin. Will start atorva 80mg  daily once taking oral.            Carlyle Dolly, M.D.

## 2014-05-13 NOTE — Progress Notes (Signed)
PULMONARY / CRITICAL CARE MEDICINE   Name: Donald Hughes MRN: [id number] DOB: [birth year]    ADMISSION DATE:  05/10/2014   REFERRING MD :  EDP  CHIEF COMPLAINT:  Unresponsive  INITIAL PRESENTATION:  58 yo male smoker found unresponsive by roommate >> noted to have bradycardia and then A fib with RVR, and intubated for airway protection.  Transferred to [facility] for further assessment.  STUDIES:  10/01 EEG >> Rt occipital sharp wave, generalized slowing 10/01 CT head >> no acute findings 10/01 CT chest >> atherosclerosis, 8 mm Lt lung nodule 10/02 EEG >> generalized slowing 10/02 Echo >> EF 65 to 10%, grade 1 diastolic dysfx 25/85 MRI brain >> widespread restricted diffusion  SIGNIFICANT EVENTS: 10/1 found unresponsive, intubated and admitted and to ICU, neurology consulted, cardiology consulted 10/3 Failed swallow assessment, off precedex  SUBJECTIVE:  Feels better.  Denies headache, chest pain, abdominal pain.  Reports that his hoarseness is chronic.  VITAL SIGNS: Temp:  [97.8 F (36.6 C)-98.7 F (37.1 C)] 98.4 F (36.9 C) (10/04 0500) Pulse Rate:  [49-142] 62 (10/04 0700) Resp:  [13-29] 29 (10/04 0700) BP: (116-203)/(64-159) 158/64 mmHg (10/04 0710) SpO2:  [92 %-100 %] 100 % (10/04 0700)  INTAKE / OUTPUT:  Intake/Output Summary (Last 24 hours) at 05/13/14 0801 Last data filed at 05/13/14 0200  Gross per 24 hour  Intake  362.6 ml  Output   1400 ml  Net -1037.4 ml   PHYSICAL EXAMINATION: General: no distress Neuro: alert, normal strength HEENT: hoarse voice Cardiovascular: regular Lungs: no wheeze Abdomen:  Soft, non tender Musculoskeletal: no edema Skin: no rashes  LABS:  CBC  Recent Labs Lab 05/11/14 0320 05/12/14 0304 05/13/14 0215  WBC 10.9* 11.4* 12.0*  HGB 11.2* 11.1* 11.6*  HCT 36.0* 37.4* 39.8  PLT 169 170 186   Coag's  Recent Labs Lab 05/10/14 1130  APTT 27  INR 1.37   BMET  Recent Labs Lab 05/11/14 0320 05/12/14 0304  05/13/14 0215  NA 145 146 144  K 3.6* 4.8 4.6  CL 107 109 103  CO2 25 32 34*  BUN 29* 22 21  CREATININE 1.01 0.68 0.67  GLUCOSE 110* 125* 84   Electrolytes  Recent Labs Lab 05/10/14 1309  05/11/14 0320 05/11/14 2128 05/12/14 0304 05/13/14 0215  CALCIUM  --   < > 8.3*  --  8.6 9.0  MG 1.5  < > 1.5 2.7* 2.4  --   PHOS 2.1*  --  1.7*  --  3.3  --   < > = values in this interval not displayed. Sepsis Markers  Recent Labs Lab 05/10/14 0809 05/11/14 0320  LATICACIDVEN 5.6* 1.2   ABG  Recent Labs Lab 05/10/14 0835 05/10/14 1105  PHART 7.103* 7.256*  PCO2ART 91.7* 66.9*  PO2ART 299.0* 156.0*   Liver Enzymes  Recent Labs Lab 05/10/14 1130  AST 64*  ALT 31  ALKPHOS 72  BILITOT 0.3  ALBUMIN 2.5*   Cardiac Enzymes  Recent Labs Lab 05/10/14 0937 05/10/14 1130 05/10/14 1615 05/10/14 2230  TROPONINI 0.56* 2.41* 7.08* 5.51*  PROBNP  --  1793.0*  --   --    Glucose  Recent Labs Lab 05/12/14 0834 05/12/14 1202 05/12/14 1642 05/12/14 2013 05/12/14 2339 05/13/14 0446  GLUCAP 78 78 86 93 77 86    Imaging Mr Brain Wo Contrast  05/12/2014   CLINICAL DATA:  58 year old male last seen normal at 1 a.m. on 05/10/2014. Found unresponsive. On admission was unresponsive with bradycardia, and  atrial fibrillation. No known past medical history. Improving altered mental status.  EXAM: MRI HEAD WITHOUT CONTRAST  TECHNIQUE: Multiplanar, multiecho pulse sequences of the brain and surrounding structures were obtained without intravenous contrast.  COMPARISON:  CT head 05/10/2014.  CT angio head and neck 05/10/2014.  FINDINGS: Motion degraded exam.  Overall study diagnostic.  Widespread areas of restricted diffusion affecting primarily the BILATERAL globus pallidus, other deep nuclei, and periventricular white matter, predominantly supratentorial, but with involvement of the cerebellum, consistent with a hypoxic/ischemic and/or hypotensive insult. Carbon monoxide poisoning can  also have this appearance, but given the punctate deep white matter areas of restricted diffusion, is less favored. No hemorrhage, mass lesion, hydrocephalus, or extra-axial fluid. Premature for age atrophy. Mild subcortical and periventricular T2 and FLAIR hyperintensities, likely chronic microvascular ischemic change. 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 Flow voids are maintained in the carotid, basilar, and vertebral arteries. There is no obvious head trauma or osseous lesion. Upper cervical region unremarkable.  Compared with prior CT studies, the these changes are not visible.  IMPRESSION: Widespread areas of restricted diffusion involving both the deep nuclei (predominately globus pallidus) as well as the deep white matter in a watershed pattern. Findings are consistent with a hypoxic/ischemic and/or hypertensive insult. Carbon monoxide poisoning has some similar imaging features; see discussion above.   Electronically Signed   By: Rolla Flatten M.D.   On: 05/12/2014 10:46     ASSESSMENT / PLAN:  PULMONARY OETT 10/1>>>10/2 A: Acute respiratory failure 2nd to airway protection >> resolved. Tobacco abuse with hx of COPD. Chronic hoarseness. P:   PRN BD's D/c decadron 10/04 Might need ENT evaluation  CARDIOVASCULAR A: A fib with RVR with elevated troponin from demand ischemia. CAD on CT scan. Hx of HTN. P:  Per cardiology ASA PR until able to swallow  RENAL A: AKI, anion gap acidosis >> resolved. Urine retention noted 10/03 >> resolved 10/04. P:   Monitor renal fx, urine outpt, electrolytes  GASTROINTESTINAL A: Dysphagia. P:   NPO F/u with speech therapy 10/04 D/c protonix since decadron d/c'ed  HEMATOLOGIC A: No active issue. P:  SQ heparin  F/u cbc intermittently  INFECTIOUS A: No evidence for infection. P: Monitor off Abx  Sputum 10/01 >>  Blood 10/01 >>    ENDOCRINE A: Steroid induced hyperglycemia - no known hx DM P:   Monitor blood sugar on BMET  NEUROLOGIC A: Acute encephalopathy ? Cause >> Abnl MRI brain 10/03 and possible seizure focus on EEG. Deconditioning. P:   AED's per neurology PT/PT evaluation  Transfer to Telemetry 10/04.  Transfer to [facility] 10/05 and PCCM sign off.  Chesley Mires, MD [facility] Pulmonary/Critical Care 05/13/2014, 8:01 AM Pager:  [phone] After 3pm call: [phone]

## 2014-05-13 NOTE — Progress Notes (Addendum)
Speech Language Pathology Treatment: Dysphagia  Patient Details Name: MARGIE BRINK MRN: [id number] DOB: [birth year] Today's Date: 05/13/2014 Time: 0712-1975 SLP Time Calculation (min): 10 min  Assessment / Plan / Recommendation Clinical Impression  Diagnostic treatment complete at bedside. Patient with notable improvements in vocal quality and general swallowing function however indication of a severe dysphagia remains. Stridor, along with hoarse vocal quality continues but improving with moderate-severe dysphonia vs aphonia reported 10/3. Patient with cough response in 75% of po trials, both ice chips and pureed solids. Recommend continued NPO with plan for MBS in am 10/5 to allow for more recovery time off vent.      HPI HPI: 58 yo male adm to [facility] after being found unresponsive, he required intubation in the ED and intubation was difficult per notes.   Difficult extubation ? due to COPD per MD note.  Per MRI 10/3, pt with widespread restricted diffusion predominately in globus pallidus that may be consistent with hypoxia, ischemic event.  CXR negative for acute changes 05/12/14.     Pertinent Vitals Pain Assessment: No/denies pain  SLP Plan  MBS (10/5)    Recommendations Diet recommendations: NPO (except ice chips after oral care PRN) Medication Administration: Via alternative means              Oral Care Recommendations:  (QID) Follow up Recommendations:  (TBD) Plan: MBS (10/5)    GO   Gabriel Rainwater MA, CCC-SLP [phone]   Digby Groeneveld Meryl 05/13/2014, 12:11 PM

## 2014-05-13 NOTE — Progress Notes (Signed)
Patient BP is steadily increasing despite adjusting cuff, giving labetalol X 3. Paged CCM

## 2014-05-14 ENCOUNTER — Inpatient Hospital Stay (HOSPITAL_COMMUNITY): Payer: Medicaid Other

## 2014-05-14 ENCOUNTER — Encounter (HOSPITAL_COMMUNITY): Payer: Self-pay | Admitting: Emergency Medicine

## 2014-05-14 DIAGNOSIS — I480 Paroxysmal atrial fibrillation: Secondary | ICD-10-CM

## 2014-05-14 DIAGNOSIS — J9600 Acute respiratory failure, unspecified whether with hypoxia or hypercapnia: Secondary | ICD-10-CM

## 2014-05-14 DIAGNOSIS — I4891 Unspecified atrial fibrillation: Secondary | ICD-10-CM

## 2014-05-14 DIAGNOSIS — T884XXA Failed or difficult intubation, initial encounter: Secondary | ICD-10-CM

## 2014-05-14 DIAGNOSIS — I2510 Atherosclerotic heart disease of native coronary artery without angina pectoris: Secondary | ICD-10-CM

## 2014-05-14 LAB — GLUCOSE, CAPILLARY
GLUCOSE-CAPILLARY: 77 mg/dL (ref 70–99)
Glucose-Capillary: 76 mg/dL (ref 70–99)
Glucose-Capillary: 91 mg/dL (ref 70–99)
Glucose-Capillary: 86 mg/dL (ref 70–99)
Glucose-Capillary: 85 mg/dL (ref 70–99)

## 2014-05-14 MED ORDER — IOHEXOL 300 MG/ML  SOLN: 75.0000 mL | Freq: Once | INTRAMUSCULAR | Status: AC | PRN

## 2014-05-14 MED ORDER — REGADENOSON 0.4 MG/5ML IV SOLN
0.4000 mg | Freq: Once | INTRAVENOUS | Status: AC
  Administered 2014-05-15: 0.4 mg via INTRAVENOUS
  Filled 2014-05-14: qty 5

## 2014-05-14 MED ORDER — LIDOCAINE VISCOUS 2 % MT SOLN
15.0000 mL | Freq: Once | OROMUCOSAL | Status: DC
  Filled 2014-05-14 (×2): qty 15

## 2014-05-14 NOTE — Progress Notes (Signed)
Subjective: No SOB or CP.   Reports sleeping during the day yesterday.   Objective: Vital signs in last 24 hours: Temp:  [97.4 F (36.3 C)-98.3 F (36.8 C)] 98.1 F (36.7 C) (10/05 0430) Pulse Rate:  [56-76] 64 (10/05 0430) Resp:  [20-25] 20 (10/05 0430) BP: (123-193)/(75-103) 156/98 mmHg (10/05 0430) SpO2:  [93 %-97 %] 93 % (10/05 0430) Weight:  [178 lb 12.8 oz (81.103 kg)-186 lb 4.8 oz (84.505 kg)] 178 lb 12.8 oz (81.103 kg) (10/05 0430) Last BM Date: 05/14/14  Intake/Output from previous day: 10/04 0701 - 10/05 0700 In: 100 [IV Piggyback:100] Out: 2700 [Urine:2700] Intake/Output this shift:    Medications Current Facility-Administered Medications  Medication Dose Route Frequency Provider Last Rate Last Dose  . antiseptic oral rinse (CPC / CETYLPYRIDINIUM CHLORIDE 0.05%) solution 7 mL  7 mL Mouth Rinse QID Rush Farmer, MD   7 mL at 05/13/14 1809  . aspirin suppository 150 mg  150 mg Rectal Daily Chesley Mires, MD   150 mg at 05/14/14 0927  . chlorhexidine (PERIDEX) 0.12 % solution 15 mL  15 mL Mouth Rinse BID Rush Farmer, MD   15 mL at 05/14/14 0821  . folic acid injection 1 mg  1 mg Intravenous Daily Grace Bushy Minor, NP   1 mg at 05/14/14 0926  . heparin injection 5,000 Units  5,000 Units Subcutaneous 3 times per day Rush Farmer, MD   5,000 Units at 05/12/14 2211  . hydrALAZINE (APRESOLINE) injection 10 mg  10 mg Intravenous Q2H PRN Colbert Coyer, MD   10 mg at 05/13/14 1115  . Influenza vac split quadrivalent PF (FLUARIX) injection 0.5 mL  0.5 mL Intramuscular Tomorrow-1000 Rush Farmer, MD      . insulin aspart (novoLOG) injection 0-15 Units  0-15 Units Subcutaneous 6 times per day Marijean Heath, NP   2 Units at 05/11/14 1700  . ipratropium-albuterol (DUONEB) 0.5-2.5 (3) MG/3ML nebulizer solution 3 mL  3 mL Nebulization Q2H PRN Chesley Mires, MD   3 mL at 05/13/14 1213  . labetalol (NORMODYNE,TRANDATE) injection 20 mg  20 mg Intravenous Q10 min PRN  Juanito Doom, MD   20 mg at 05/13/14 0220  . levETIRAcetam (KEPPRA) IVPB 500 mg/100 mL premix  500 mg Intravenous Q12H Roland Rack, MD   500 mg at 05/14/14 0926  . thiamine (B-1) injection 100 mg  100 mg Intravenous Daily Grace Bushy Minor, NP   100 mg at 05/14/14 0927    PE: General appearance: alert, cooperative and no distress Lungs: Decreased BS throughout Heart: regular rate and rhythm, S1, S2 normal, no murmur, click, rub or gallop Extremities: No LEE Pulses: 2+ radials Skin: Warm and dry Neurologic: Grossly normal  Lab Results:   Recent Labs  05/12/14 0304 05/13/14 0215  WBC 11.4* 12.0*  HGB 11.1* 11.6*  HCT 37.4* 39.8  PLT 170 186   BMET  Recent Labs  05/12/14 0304 05/13/14 0215  NA 146 144  K 4.8 4.6  CL 109 103  CO2 32 34*  GLUCOSE 125* 84  BUN 22 21  CREATININE 0.68 0.67  CALCIUM 8.6 9.0   PT/INR No results found for this basename: LABPROT, INR,  in the last 72 hours Cholesterol  Recent Labs  05/13/14 0215  CHOL 117   Lipid Panel     Component Value Date/Time   CHOL 117 05/13/2014 0215   TRIG 148 05/13/2014 0215   HDL 26* 05/13/2014 0215  CHOLHDL 4.5 05/13/2014 0215   VLDL 30 05/13/2014 0215   LDLCALC 61 05/13/2014 0215       Assessment/Plan   Active Problems:   Respiratory failure, acute   Altered mental status   Septic shock   Aspiration pneumonia   Atrial fibrillation   Elevated troponin   Demand ischemia  58 yo with history of tobacco abuse. He appeared to be stable when seen by roommate approximately 1 AM. At 6 AM he was found unresponsive. When EMS arrived he had bradycardia and decreased respirations. His airway was placed. As he arrived in the emergency room he had rapid atrial fibrillation.   Afib  - isolated episode upon presentation in the setting of severe systemic illness, converted back to NSR.  - we have not committed to long term anticoag at this point, follow for afib recurrence. None noted on tele review  over the weekend   Review of telemetry this morning reveals episodes of bradycardia during the day into the upper 30's and sinus tach into the 140's.  The patient reports he was sleeping some yesterday.   May be related to apnea.  Intubation was noted to be difficult due to laryngeal edema.  Sounds like this could be contributing to apneic events.  Should probably  have CPAP during sleep and sleep study.    Modified barium swallow today: Moderate pharyngeal phase dysphagia; Severe pharyngeal phase dysphagia.     Demand ischemia  - peak trop of 7 in setting of acute resp failure, septic shock, aspiration pneumonia, and initially afib with RVR.  - echo 05/11/14 LVEF 65-70% and vigorous, grade I diastolic dysfunction. EKG without specific ischemic changes. CT PE no PE, noted coronary atherosclerotic disease.  - based on findings likely demand ischemia in setting of chronic obstructive CAD, presentation not consistent with acute occlusive coronary disease  - can consider non-invasive ischemic testing once acute issues have resolved, either inpatient or outpatient.  AMS  - followed by neurology  CAD  - noted coronary calcifications on CT scan, unclear functionality  - continue ASA. Fairly normal lipid panel however in the setting of elevated trop with CAD on CT scan would benefit from statin. Will start atorva 80mg  daily once taking oral.   Tobacco abuse      LOS: 4 days    HAGER, BRYAN PA-C 05/14/2014 10:32 AM  I have seen and examined the patient along with Tarri Fuller, PA.  I have reviewed the chart, notes and new data.  I agree with PA's note.  The events leading to the acute illness are quite unclear. He clearly has evidence for a major drop in systemic perfusion (hypotension) that could have been secondary to arrhythmia or sepsis or respiratory arrest.  Bradycardia followed by transient AF on day 1. Some mild sinus bradycardia during sleep and brief atrial tachycardia are only meaningful  recorded arrhythmia since then. Coincidental evidence of coronary calcifications, no symptoms of CAD, preserved EF, elevated troponin 7 in setting of respiratory arrest. UDS negative  PLAN: Check Lexiscan Myoview tomorrow Consider implantable loop recorder and sleep study  Sanda Klein, MD, [facility] [phone] 05/14/2014, 1:08 PM

## 2014-05-14 NOTE — Consult Note (Signed)
Donald Hughes, Donald Hughes 58 y.o., male [id number]     Chief Complaint: hoarseness  HPI: 58 yo wm found unconscious at home.  Intubated x 2 days, then extubated.  Breathing OK.  Hoarseness x 2+ mos.  No pain.  No clear progression.  No bleeding or hemoptysis.  No neck masses.  Stopped smoking cigarettes 1 mo ago.  Smokes marijuana.  No hx Ca.  CT chest shows LEFT upper lung 8 mm nodule.    PMH: Past Medical History  Diagnosis Date  . Difficult intubation 05/10/14    laryngeal edema, difficult DL and video laryngoscopy    Surg JI:RCVELFY reviewed. No pertinent past surgical history.  FHx:  History reviewed. No pertinent family history. SocHx:  reports that he has quit smoking. He does not have any smokeless tobacco history on file. His alcohol and drug histories are not on file.  ALLERGIES: Not on File  Medications Prior to Admission  Medication Sig Dispense Refill  . [DISCONTINUED] acetaminophen (TYLENOL) 325 MG tablet Take 650 mg by mouth every 6 (six) hours as needed for moderate pain.      . [DISCONTINUED] ibuprofen (ADVIL,MOTRIN) 200 MG tablet Take 400 mg by mouth every 6 (six) hours as needed for moderate pain.        Results for orders placed during the hospital encounter of 05/10/14 (from the past 48 hour(s))  GLUCOSE, CAPILLARY     Status: None   Collection Time    05/12/14  8:13 PM      Result Value Ref Range   Glucose-Capillary 93  70 - 99 mg/dL  GLUCOSE, CAPILLARY     Status: None   Collection Time    05/12/14 11:39 PM      Result Value Ref Range   Glucose-Capillary 77  70 - 99 mg/dL  CBC     Status: Abnormal   Collection Time    05/13/14  2:15 AM      Result Value Ref Range   WBC 12.0 (*) 4.0 - 10.5 K/uL   RBC 4.86  4.22 - 5.81 MIL/uL   Hemoglobin 11.6 (*) 13.0 - 17.0 g/dL   HCT 39.8  39.0 - 52.0 %   MCV 81.9  78.0 - 100.0 fL   MCH 23.9 (*) 26.0 - 34.0 pg   MCHC 29.1 (*) 30.0 - 36.0 g/dL   RDW 18.5 (*) 11.5 - 15.5 %   Platelets 186  150 - 400 K/uL  BASIC METABOLIC  PANEL     Status: Abnormal   Collection Time    05/13/14  2:15 AM      Result Value Ref Range   Sodium 144  137 - 147 mEq/L   Potassium 4.6  3.7 - 5.3 mEq/L   Chloride 103  96 - 112 mEq/L   CO2 34 (*) 19 - 32 mEq/L   Glucose, Bld 84  70 - 99 mg/dL   BUN 21  6 - 23 mg/dL   Creatinine, Ser 0.67  0.50 - 1.35 mg/dL   Calcium 9.0  8.4 - 10.5 mg/dL   GFR calc non Af Amer >90  >90 mL/min   GFR calc Af Amer >90  >90 mL/min   Comment: (NOTE)     The eGFR has been calculated using the CKD EPI equation.     This calculation has not been validated in all clinical situations.     eGFR's persistently <90 mL/min signify possible Chronic Kidney     Disease.   Anion gap 7  5 - 15  LIPID PANEL     Status: Abnormal   Collection Time    05/13/14  2:15 AM      Result Value Ref Range   Cholesterol 117  0 - 200 mg/dL   Triglycerides 148  <150 mg/dL   HDL 26 (*) >39 mg/dL   Total CHOL/HDL Ratio 4.5     VLDL 30  0 - 40 mg/dL   LDL Cholesterol 61  0 - 99 mg/dL   Comment:            Total Cholesterol/HDL:CHD Risk     Coronary Heart Disease Risk Table                         Men   Women      1/2 Average Risk   3.4   3.3      Average Risk       5.0   4.4      2 X Average Risk   9.6   7.1      3 X Average Risk  23.4   11.0                Use the calculated Patient Ratio     above and the CHD Risk Table     to determine the patient's CHD Risk.                ATP III CLASSIFICATION (LDL):      <100     mg/dL   Optimal      100-129  mg/dL   Near or Above                        Optimal      130-159  mg/dL   Borderline      160-189  mg/dL   High      >190     mg/dL   Very High  GLUCOSE, CAPILLARY     Status: None   Collection Time    05/13/14  4:46 AM      Result Value Ref Range   Glucose-Capillary 86  70 - 99 mg/dL  GLUCOSE, CAPILLARY     Status: None   Collection Time    05/13/14  8:19 AM      Result Value Ref Range   Glucose-Capillary 80  70 - 99 mg/dL  GLUCOSE, CAPILLARY     Status: None    Collection Time    05/13/14 12:05 PM      Result Value Ref Range   Glucose-Capillary 83  70 - 99 mg/dL  GLUCOSE, CAPILLARY     Status: None   Collection Time    05/13/14  3:59 PM      Result Value Ref Range   Glucose-Capillary 89  70 - 99 mg/dL  GLUCOSE, CAPILLARY     Status: None   Collection Time    05/13/14  9:28 PM      Result Value Ref Range   Glucose-Capillary 93  70 - 99 mg/dL   Comment 1 Notify RN    GLUCOSE, CAPILLARY     Status: None   Collection Time    05/13/14 11:06 PM      Result Value Ref Range   Glucose-Capillary 86  70 - 99 mg/dL   Comment 1 Notify RN    GLUCOSE, CAPILLARY     Status: None   Collection Time  05/14/14  4:32 AM      Result Value Ref Range   Glucose-Capillary 76  70 - 99 mg/dL  GLUCOSE, CAPILLARY     Status: None   Collection Time    05/14/14  7:45 AM      Result Value Ref Range   Glucose-Capillary 91  70 - 99 mg/dL  GLUCOSE, CAPILLARY     Status: None   Collection Time    05/14/14 11:51 AM      Result Value Ref Range   Glucose-Capillary 86  70 - 99 mg/dL  GLUCOSE, CAPILLARY     Status: None   Collection Time    05/14/14  4:48 PM      Result Value Ref Range   Glucose-Capillary 77  70 - 99 mg/dL   No results found.   Blood pressure 163/93, pulse 63, temperature 98.2 F (36.8 C), temperature source Oral, resp. rate 18, height _0  (1.651 m), weight 81.103 kg (178 lb 12.8 oz), SpO2 97.00%.  PHYSICAL EXAM: Overall appearance:  Somewhat unkempt.  Mental status seems sharp.  Voice raspy with minimal phonation.  Mild-mod insp stridor.  Weak cough Head:  NCAT Ears:  clear Nose:  LEFT DNS Oral Cavity:  Edentulous.   Oral Pharynx:  Nl Neuro: grossly intact Neck:2 cm firms RIGHT level II node  Hypopharynx/Larynx per flexible laryngoscope:  Juvenile epiglottis with swelling  Ulceration visible on laryngeal surface of LEFT aryepiglottic fold.  Could not see actual vocal cords.  No obvious pooling in valleculae or piriforms.   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  Airway fair at present.  RIGHT neck adenopathy.  Lung nodule of uncertain significance.  Needs CT neck + contrast.  Needs eval with RT and Med Onc.  Will need a biopsy of the larynx, but this might precipitate airway collapse with need for trach.  Would do consults quickly as outpatient, then plan admission for direct laryngoscopy, biopsy, tracheostomy.  Will need a PET scan when a formal diagnosis of cancer has been established.    Not sure how this contributes to his home arrest episode.  May need a gastrostomy tube.  I will try to put him on for cancer conference this Wed AM.    Jodi Marble 16/08/958, 7:22 PM

## 2014-05-14 NOTE — Evaluation (Signed)
Occupational Therapy Evaluation Patient Details Name: CECILIA Hughes MRN: [id number] DOB: April 24, 1956 Today's Date: 05/14/2014    History of Present Illness 58 yo male smoker found unresponsive by roommate >> noted to have bradycardia and then A fib with RVR, and intubated for airway protection.VDRF 10/1-10/2 MRI=Widespread areas of restricted diffusion affecting primarily the   Clinical Impression   Pt is pleasant and cooperative.  He exhibits decreased dynamic balance as it relates to selfcare function and demonstrated multiple episodes of swaying and LOB posteriorly when attempting to stand from lower surface, without use of the UEs.  Overall min assist level for simulated selfcare tasks at this time.  Feel he can reach supervision to modified independent level with acute OT services.  Will continue to follow.      Follow Up Recommendations  No OT follow up    Equipment Recommendations  Tub/shower seat       Precautions / Restrictions Precautions Precautions: Fall Restrictions Weight Bearing Restrictions: No      Mobility Bed Mobility   Bed Mobility: Supine to Sit     Supine to sit: Supervision        Transfers Overall transfer level: Needs assistance Equipment used: 1 person hand held assist Transfers: Sit to/from Stand Sit to Stand: Min assist Stand pivot transfers: Min assist       General transfer comment: Pt with initial LOB to the left in standing.  Slight LOB also posteriorly when attempting to stand from the lower toilet.     Balance Overall balance assessment: Needs assistance   Sitting balance-Leahy Scale: Good       Standing balance-Leahy Scale: Fair Standing balance comment: Pt with increased swaying to the left with mobility and transfers.                            ADL Overall ADL's : Needs assistance/impaired Eating/Feeding: Independent (simulated, pt currently modified independent)   Grooming: Wash/dry hands;Wash/dry  face;Brushing hair;Minimal assistance   Upper Body Bathing: Supervision/ safety;Sitting   Lower Body Bathing: Minimal assistance;Sit to/from stand   Upper Body Dressing : Supervision/safety;Sitting   Lower Body Dressing: Minimal assistance   Toilet Transfer: Minimal assistance   Toileting- Clothing Manipulation and Hygiene: Minimal assistance       Functional mobility during ADLs: Minimal assistance General ADL Comments: Pt is currently min assist with occasional LOB posteriorly and to the left with transfers and mobility.  Oxygen sats 93-94% on room air with activity.       Vision Eye Alignment: Within Functional Limits Alignment/Gaze Preference: Within Defined Limits Ocular Range of Motion: Within Functional Limits Tracking/Visual Pursuits: Able to track stimulus in all quads without difficulty Saccades: Within functional limits Convergence: Within functional limits     Additional Comments: Pt reports slight blurriness but not other deficits.   Perception Perception Perception Tested?: No   Praxis Praxis Praxis tested?: Within functional limits    Pertinent Vitals/Pain Pain Assessment: No/denies pain     Hand Dominance Right   Extremity/Trunk Assessment Upper Extremity Assessment Upper Extremity Assessment: Generalized weakness   Lower Extremity Assessment Lower Extremity Assessment: Defer to PT evaluation   Cervical / Trunk Assessment Cervical / Trunk Assessment: Normal   Communication Communication Communication: No difficulties   Cognition Arousal/Alertness: Awake/alert Behavior During Therapy: WFL for tasks assessed/performed Overall Cognitive Status: Impaired/Different from baseline Area of Impairment: Orientation Orientation Level: Time  Home Living Family/patient expects to be discharged to:: Private residence Living Arrangements: Spouse/significant other Available Help at Discharge: Friend(s);Available 24  hours/day Type of Home: House Home Access: Stairs to enter [employer] of Steps: 2   Home Layout: One level     Bathroom Shower/Tub: Occupational psychologist: Standard Bathroom Accessibility: Yes   Home Equipment: None          Prior Functioning/Environment Level of Independence: Independent        Comments: 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  OT Goal Formulation: With patient Time For Goal Achievement: 05/21/14 Potential to Achieve Goals: Good  OT Frequency: Min 2X/week              End of Session Equipment Utilized During Treatment: Gait belt Nurse Communication: Mobility status  Activity Tolerance: Patient tolerated treatment well Patient left: in chair;with call bell/phone within reach;with chair alarm set   Time: 6144-3154 OT Time Calculation (min): 25 min Charges:  OT General Charges $OT Visit: 1 Procedure OT Evaluation $Initial OT Evaluation Tier I: 1 Procedure OT Treatments $Self Care/Home Management : 8-22 mins  Donald Hughes,Donald Hughes OTR/L 05/14/2014, 1:42 PM

## 2014-05-14 NOTE — Progress Notes (Signed)
[facility] PROGRESS NOTE   Donald Hughes GOT:[id number] DOB: 1955/11/22 DOA: 05/10/2014 PCP: No primary provider on file.  HPI/Subjective: Patient denies any significant complaints, still has a very hoarse voice.  Assessment/Plan: Active Problems:   Respiratory failure, acute   Altered mental status   Septic shock   Aspiration pneumonia   Atrial fibrillation   Elevated troponin   Demand ischemia    Acute respiratory failure -Patient presented to the hospital after he was found unresponsive by his girlfriend. -Patient intubated at the scene and admitted to the ICU upon admission to the hospital. -Extubated on the second, chest x-ray showed questionable aspiration pneumonia.  Septic shock -Patient admitted to the hospital with acute respiratory failure, intubated to protect his airway. -Was having fever, leukocytosis a low blood pressure on admission consistent with sepsis. -Patient also has elevated lactic acid of 5.6 on admission.  Demand ischemia -Patient presented with elevated troponin, peaked to 7. -Settings of acute respiratory failure, septic shock and aspiration pneumonia as well as atrial fibrillation with RVR. -Patient seen by cardiology, 2-D echo was done and showed no wall motion abnormalities. -Troponin elevation is likely secondary to demand ischemia rather than acute occlusive coronary artery disease.  Dysphagia -Patient mentioned hoarseness of voice for the past 4-6 weeks. -Patient intubated by EMS, after extubation patient failed swallow evaluation. -MB is to be done today, patient will have ENT eval prior to discharge.  Questionable seizures -Neurology was consulted an EEG was done and showed possible focus for seizure. -Patient started on Depakote, currently no seizure-like activity.  Acute CVA -MRI was done and showed multiple areas of restricted diffusion, likely secondary to hypotension. -This is probably a mild form of ischemic brain  injury secondary to severe hypotension from septic shock. -Allow permissive hypertension, avoid hypotension or hypovolemia.  Code Status:  Family Communication: Plan discussed with the patient. Disposition Plan: Remains inpatient   Consultants:  Patient was in the PCCM from admission until 05/13/14, [facility] assumed care on 10/15 a.m.  Procedures:  05/10/2014 insertion of arterial line by nocturia group.  05/10/2014 insertion of central venous catheter done by Dr. you could.  05/10/2014 EEG showed right occipital area of potential epileptic seizure focus, read by Dr. Leonel Ramsay.  05-2014 intubation on the first and extubation the second.  Antibiotics:  None   Objective: Filed Vitals:   05/14/14 0430  BP: 156/98  Pulse: 64  Temp: 98.1 F (36.7 C)  Resp: 20    Intake/Output Summary (Last 24 hours) at 05/14/14 1011 Last data filed at 05/14/14 0911  Gross per 24 hour  Intake      0 ml  Output   2050 ml  Net  -2050 ml   Filed Weights   05/12/14 0337 05/13/14 1319 05/14/14 0430  Weight: 85.8 kg (189 lb 2.5 oz) 84.505 kg (186 lb 4.8 oz) 81.103 kg (178 lb 12.8 oz)    Exam: General: Alert and awake, oriented x3, not in any acute distress. HEENT: anicteric sclera, pupils reactive to light and accommodation, EOMI CVS: S1-S2 clear, no murmur rubs or gallops Chest: clear to auscultation bilaterally, no wheezing, rales or rhonchi Abdomen: soft nontender, nondistended, normal bowel sounds, no organomegaly Extremities: no cyanosis, clubbing or edema noted bilaterally Neuro: Cranial nerves II-XII intact, no focal neurological deficits  Data Reviewed: Basic Metabolic Panel:  Recent Labs Lab 05/10/14 1130 05/10/14 1309 05/10/14 2159 05/11/14 0320 05/11/14 2128 05/12/14 0304 05/13/14 0215  NA 145  --  147 145  --  146  144  K 4.3 4.1 3.6* 3.6*  --  4.8 4.6  CL 106  --  108 107  --  109 103  CO2 28  --  25 25  --  32 34*  GLUCOSE 80  --  87 110*  --  125* 84  BUN 33*   --  28* 29*  --  22 21  CREATININE 1.23  --  1.07 1.01  --  0.68 0.67  CALCIUM 7.8*  --  8.2* 8.3*  --  8.6 9.0  MG  --  1.5 1.5 1.5 2.7* 2.4  --   PHOS  --  2.1*  --  1.7*  --  3.3  --    Liver Function Tests:  Recent Labs Lab 05/10/14 1130  AST 64*  ALT 31  ALKPHOS 72  BILITOT 0.3  PROT 5.9*  ALBUMIN 2.5*   No results found for this basename: LIPASE, AMYLASE,  in the last 168 hours  Recent Labs Lab 05/10/14 2030  AMMONIA 25   CBC:  Recent Labs Lab 05/10/14 0809 05/10/14 1130 05/11/14 0320 05/12/14 0304 05/13/14 0215  WBC 13.0* 14.9* 10.9* 11.4* 12.0*  NEUTROABS 11.2* 13.3*  --   --   --   HGB 14.3 11.2* 11.2* 11.1* 11.6*  HCT 48.4 38.1* 36.0* 37.4* 39.8  MCV 83.7 81.8 77.9* 82.0 81.9  PLT 280 183 169 170 186   Cardiac Enzymes:  Recent Labs Lab 05/10/14 0937 05/10/14 1130 05/10/14 1615 05/10/14 2159 05/10/14 2230  CKTOTAL 371*  --   --  686*  --   CKMB  --   --   --  19.6*  --   TROPONINI 0.56* 2.41* 7.08*  --  5.51*   BNP (last 3 results)  Recent Labs  05/10/14 1130  PROBNP 1793.0*   CBG:  Recent Labs Lab 05/13/14 1559 05/13/14 2128 05/13/14 2306 05/14/14 0432 05/14/14 0745  GLUCAP 89 93 86 76 91    Micro Recent Results (from the past 240 hour(s))  URINE CULTURE     Status: None   Collection Time    05/10/14  8:17 AM      Result Value Ref Range Status   Specimen Description URINE, CATHETERIZED   Final   Special Requests NONE   Final   Culture  Setup Time     Final   Value: 05/10/2014 12:58     Performed at [facility]     Final   Value: NO GROWTH     Performed at [place]     Final   Value: NO GROWTH     Performed at [facility]   Report Status 05/11/2014 FINAL   Final  CULTURE, BLOOD (ROUTINE X 2)     Status: None   Collection Time    05/10/14  8:21 AM      Result Value Ref Range Status   Specimen Description BLOOD LEFT WRIST   Final   Special Requests BOTTLES  DRAWN AEROBIC ONLY 2MLS   Final   Culture  Setup Time     Final   Value: 05/10/2014 12:55     Performed at [facility]   Culture     Final   Value:        BLOOD CULTURE RECEIVED NO GROWTH TO DATE CULTURE WILL BE HELD FOR 5 DAYS BEFORE ISSUING A FINAL NEGATIVE REPORT     Performed at [facility]  Report Status PENDING   Incomplete  CULTURE, BLOOD (ROUTINE X 2)     Status: None   Collection Time    05/10/14  8:35 AM      Result Value Ref Range Status   Specimen Description BLOOD RIGHT FOREARM   Final   Special Requests BOTTLES DRAWN AEROBIC ONLY 3MLS   Final   Culture  Setup Time     Final   Value: 05/10/2014 12:54     Performed at [facility]   Culture     Final   Value:        BLOOD CULTURE RECEIVED NO GROWTH TO DATE CULTURE WILL BE HELD FOR 5 DAYS BEFORE ISSUING A FINAL NEGATIVE REPORT     Performed at [facility]   Report Status PENDING   Incomplete  CULTURE, RESPIRATORY (NON-EXPECTORATED)     Status: None   Collection Time    05/10/14 11:30 AM      Result Value Ref Range Status   Specimen Description TRACHEAL ASPIRATE   Final   Special Requests NONE   Final   Gram Stain     Final   Value: FEW WBC PRESENT,BOTH PMN AND MONONUCLEAR     RARE SQUAMOUS EPITHELIAL CELLS PRESENT     NO ORGANISMS SEEN     Performed at [facility]   Culture     Final   Value: MODERATE STREPTOCOCCUS,BETA HEMOLYTIC NOT GROUP A     Performed at [facility]   Report Status 05/13/2014 FINAL   Final  MRSA PCR SCREENING     Status: None   Collection Time    05/10/14 12:54 PM      Result Value Ref Range Status   MRSA by PCR NEGATIVE  NEGATIVE Final   Comment:            The GeneXpert MRSA Assay (FDA     approved for NASAL specimens     only), is one component of a     comprehensive MRSA colonization     surveillance program. It is not     intended to diagnose MRSA     infection nor to guide or     monitor treatment for     MRSA infections.       Studies: Mr Herby Abraham Contrast  05/12/2014   CLINICAL DATA:  58 year old male last seen normal at 1 a.m. on 05/10/2014. Found unresponsive. On admission was unresponsive with bradycardia, and atrial fibrillation. No known past medical history. Improving altered mental status.  EXAM: MRI HEAD WITHOUT CONTRAST  TECHNIQUE: Multiplanar, multiecho pulse sequences of the brain and surrounding structures were obtained without intravenous contrast.  COMPARISON:  CT head 05/10/2014.  CT angio head and neck 05/10/2014.  FINDINGS: Motion degraded exam.  Overall study diagnostic.  Widespread areas of restricted diffusion affecting primarily the BILATERAL globus pallidus, other deep nuclei, and periventricular white matter, predominantly supratentorial, but with involvement of the cerebellum, consistent with a hypoxic/ischemic and/or hypotensive insult. Carbon monoxide poisoning can also have this appearance, but given the punctate deep white matter areas of restricted diffusion, is less favored. No hemorrhage, mass lesion, hydrocephalus, or extra-axial fluid. Premature for age atrophy. Mild subcortical and periventricular T2 and FLAIR hyperintensities, likely chronic microvascular ischemic change. 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 Flow voids are maintained in the carotid, basilar, and vertebral arteries. There is no obvious head trauma or osseous  lesion. Upper cervical region unremarkable.  Compared with prior CT studies, the these changes are not visible.  IMPRESSION: Widespread areas of restricted diffusion involving both the deep nuclei (predominately globus pallidus) as well as the deep white matter in a watershed pattern. Findings are consistent with a hypoxic/ischemic and/or hypertensive insult. Carbon monoxide poisoning has some similar imaging features; see discussion above.   Electronically Signed   By: Rolla Flatten M.D.   On: 05/12/2014 10:46    Scheduled Meds: . antiseptic oral rinse  7 mL Mouth Rinse QID  . aspirin  150 mg Rectal Daily  . chlorhexidine  15 mL Mouth Rinse BID  . folic acid  1 mg Intravenous Daily  . heparin subcutaneous  5,000 Units Subcutaneous 3 times per day  . Influenza vac split quadrivalent PF  0.5 mL Intramuscular Tomorrow-1000  . insulin aspart  0-15 Units Subcutaneous 6 times per day  . levETIRAcetam  500 mg Intravenous Q12H  . thiamine IV  100 mg Intravenous Daily   Continuous Infusions:      Time spent: 35 minutes    [facility] A  [facility] Pager [phone] If 7PM-7AM, please contact night-coverage at [email or web address], password [facility] 05/14/2014, 10:11 AM  LOS: 4 days

## 2014-05-14 NOTE — Procedures (Signed)
Objective Swallowing Evaluation: Modified Barium Swallowing Study  Patient Details  Name: Donald Hughes MRN: [id number] Date of Birth: September 07, 1955  Today's Date: 05/14/2014 Time: 1021-1050 SLP Time Calculation (min): 29 min  Past Medical History:  Past Medical History  Diagnosis Date  . Difficult intubation 05/10/14    laryngeal edema, difficult DL and video laryngoscopy   Past Surgical History: History reviewed. No pertinent past surgical history. HPI:  58 yo male adm to [facility] after being found unresponsive, he required intubation in the ED and intubation was difficult per notes.   Difficult extubation ? due to COPD per MD note.  Per MRI 10/3, pt with widespread restricted diffusion predominately in globus pallidus that may be consistent with hypoxia, ischemic event.  CXR negative for acute changes 05/12/14.       Assessment / Plan / Recommendation Clinical Impression  Dysphagia Diagnosis: Moderate pharyngeal phase dysphagia;Severe pharyngeal phase dysphagia  Clinical impression: Pt presents with a moderate-severe pharyngeal dysphagia due to motor, sensory, and anatomical changes. Pt has a delay in swallow initiation, which triggers at the level of the valleculae with decreased hyolaryngeal movement noted. This, as well as anatomical canges (suspect edema versus structural component in the pharynx, primarily noted from the distal opening of the vestibule downward toward the pyriform sinuses), resulted in decreased epiglottic deflection and airway closure, with mild residuals remaining after the swallow. Pt silently aspirated thin liquids and silently penetrated all other liquid consistencies tested, either during the initial swallow or on residual material. Laryngeal penetrates were very challenging for the patient to clear despite multimodal cueing from SLP given weak cough. Pt did have sensation with penetration of puree, although unfortunately his reflexive cough did not yield productive  results. Recommend to remain NPO except ice chips PRN after oral care. Pt may try therapeutic trials of puree at bedside with SLP only. Strongly recommend ENT consult to assess suspected anatomical changes to the pharynx that are impacting his current swallow function, especially given prolonged period of hoarseness and fluctuating vocal quality leading up to this admission.    Treatment Recommendation  Therapy as outlined in treatment plan below    Diet Recommendation NPO;Ice chips PRN after oral care   Medication Administration: Via alternative means    Other  Recommendations Recommended Consults: Consider ENT evaluation Oral Care Recommendations: Oral care Q4 per protocol;Oral care prior to ice chips   Follow Up Recommendations  Home health SLP    Frequency and Duration min 3x week  1 week   Pertinent Vitals/Pain n/a    SLP Swallow Goals     General Date of Onset: 05/12/14 HPI: 58 yo male adm to [facility] after being found unresponsive, he required intubation in the ED and intubation was difficult per notes.   Difficult extubation ? due to COPD per MD note.  Per MRI 10/3, pt with widespread restricted diffusion predominately in globus pallidus that may be consistent with hypoxia, ischemic event.  CXR negative for acute changes 05/12/14.   Type of Study: Modified Barium Swallowing Study Reason for Referral: Objectively evaluate swallowing function Previous Swallow Assessment: BSE 10/3 recommending objective swallowing test prior to initiation of PO diet Diet Prior to this Study: NPO;Other (Comment) (except sips with meds) Temperature Spikes Noted: No Respiratory Status: Room air History of Recent Intubation: Yes Length of Intubations (days): 2 days Date extubated: 05/11/14 Behavior/Cognition: Alert;Cooperative Oral Cavity - Dentition: Poor condition;Missing dentition Oral Motor / Sensory Function: Impaired - see Bedside swallow eval Self-Feeding Abilities: Able to feed  self Patient  Positioning: Upright in chair Baseline Vocal Quality: Low vocal intensity;Hoarse;Other (comment) (quality fluctuates throughout session) Volitional Cough: Weak Volitional Swallow: Able to elicit (delayed) Anatomy: Other (Comment) (? edema versus structural component that blocks pyriforms) Pharyngeal Secretions: Not observed secondary MBS    Reason for Referral Objectively evaluate swallowing function   Oral Phase Oral Preparation/Oral Phase Oral Phase: WFL   Pharyngeal Phase Pharyngeal Phase Pharyngeal Phase: Impaired Pharyngeal - Honey Pharyngeal - Honey Cup: Delayed swallow initiation;Reduced epiglottic inversion;Reduced anterior laryngeal mobility;Reduced laryngeal elevation;Reduced airway/laryngeal closure;Penetration/Aspiration after swallow;Pharyngeal residue - valleculae;Pharyngeal residue - cp segment Penetration/Aspiration details (honey cup): Material enters airway, remains ABOVE vocal cords and not ejected out Pharyngeal - Nectar Pharyngeal - Nectar Cup: Delayed swallow initiation;Reduced epiglottic inversion;Reduced anterior laryngeal mobility;Reduced laryngeal elevation;Reduced airway/laryngeal closure;Penetration/Aspiration after swallow;Pharyngeal residue - valleculae;Pharyngeal residue - cp segment;Penetration/Aspiration during swallow Penetration/Aspiration details (nectar cup): Material enters airway, remains ABOVE vocal cords and not ejected out Pharyngeal - Thin Pharyngeal - Thin Cup: Delayed swallow initiation;Reduced epiglottic inversion;Reduced anterior laryngeal mobility;Reduced laryngeal elevation;Reduced airway/laryngeal closure;Pharyngeal residue - valleculae;Pharyngeal residue - cp segment;Penetration/Aspiration during swallow Penetration/Aspiration details (thin cup): Material enters airway, passes BELOW cords without attempt by patient to eject out (silent aspiration) Pharyngeal - Solids Pharyngeal - Puree: Delayed swallow initiation;Reduced epiglottic  inversion;Reduced anterior laryngeal mobility;Reduced laryngeal elevation;Reduced airway/laryngeal closure;Penetration/Aspiration after swallow;Pharyngeal residue - valleculae;Pharyngeal residue - cp segment;Penetration/Aspiration during swallow Penetration/Aspiration details (puree): Material enters airway, remains ABOVE vocal cords and not ejected out (despite cough attempt by patient)  Cervical Esophageal Phase    GO             [name], [name] CCC-SLP [phone]  [name] 05/14/2014, 11:38 AM

## 2014-05-15 ENCOUNTER — Encounter (HOSPITAL_COMMUNITY): Payer: MEDICAID | Attending: Cardiovascular Disease

## 2014-05-15 ENCOUNTER — Inpatient Hospital Stay (HOSPITAL_COMMUNITY): Payer: Medicaid Other

## 2014-05-15 ENCOUNTER — Ambulatory Visit (HOSPITAL_COMMUNITY): Payer: Medicaid Other

## 2014-05-15 ENCOUNTER — Encounter (HOSPITAL_COMMUNITY): Payer: Self-pay | Admitting: Radiology

## 2014-05-15 ENCOUNTER — Encounter: Payer: Self-pay | Admitting: *Deleted

## 2014-05-15 DIAGNOSIS — I2510 Atherosclerotic heart disease of native coronary artery without angina pectoris: Secondary | ICD-10-CM

## 2014-05-15 LAB — GLUCOSE, CAPILLARY
GLUCOSE-CAPILLARY: 82 mg/dL (ref 70–99)
GLUCOSE-CAPILLARY: 77 mg/dL (ref 70–99)
Glucose-Capillary: 148 mg/dL — ABNORMAL HIGH (ref 70–99)
Glucose-Capillary: 63 mg/dL — ABNORMAL LOW (ref 70–99)
Glucose-Capillary: 70 mg/dL (ref 70–99)
Glucose-Capillary: 92 mg/dL (ref 70–99)

## 2014-05-15 MED ORDER — LISINOPRIL 10 MG PO TABS
10.0000 mg | ORAL_TABLET | Freq: Every day | ORAL | Status: DC
  Administered 2014-05-17 – 2014-05-19 (×3): 10 mg via ORAL
  Filled 2014-05-15 (×5): qty 1

## 2014-05-15 MED ORDER — TECHNETIUM TC 99M SESTAMIBI GENERIC - CARDIOLITE
10.0000 | Freq: Once | INTRAVENOUS | Status: AC | PRN
  Administered 2014-05-15: 10 via INTRAVENOUS

## 2014-05-15 MED ORDER — DEXTROSE 50 % IV SOLN
25.0000 mL | Freq: Once | INTRAVENOUS | Status: AC | PRN
  Administered 2014-05-15: 25 mL via INTRAVENOUS

## 2014-05-15 MED ORDER — DEXTROSE 50 % IV SOLN
INTRAVENOUS | Status: AC
  Administered 2014-05-16: 50 mL
  Filled 2014-05-15: qty 50

## 2014-05-15 MED ORDER — FUROSEMIDE 10 MG/ML IJ SOLN
40.0000 mg | Freq: Once | INTRAMUSCULAR | Status: AC
  Administered 2014-05-15: 40 mg via INTRAVENOUS
  Filled 2014-05-15: qty 4

## 2014-05-15 MED ORDER — TECHNETIUM TC 99M SESTAMIBI - CARDIOLITE
30.0000 | Freq: Once | INTRAVENOUS | Status: AC | PRN
  Administered 2014-05-15: 30 via INTRAVENOUS

## 2014-05-15 MED ORDER — REGADENOSON 0.4 MG/5ML IV SOLN
INTRAVENOUS | Status: AC
  Administered 2014-05-15: 0.4 mg via INTRAVENOUS
  Filled 2014-05-15: qty 5

## 2014-05-15 NOTE — Progress Notes (Signed)
Occupational Therapy Treatment Patient Details Name: ESTIVEN KOHAN MRN: [id number] DOB: [birth year] Today's Date: 05/15/2014    History of present illness 58 yo male smoker found unresponsive by roommate >> noted to have bradycardia and then A fib with RVR, and intubated for airway protection.VDRF 10/1-10/2 MRI=Widespread areas of restricted diffusion affecting primarily the   OT comments  Patient tolerated OT treatment well. Patient engaged in bed mobility and transferred OOB with min assist from therapist (patient with LOB transitioning from sit>stand, requiring assistance from therapist). Patient then ambulated > sink for grooming task of washing hands and combing hair while standing with min guard assistance provided from therapist. From here, patient ambulated into BR for toileting needs in standing position. Patient then ambulated > recliner and sat with min guard assist. Therapist educated patient on shoulder flexion, elbow flexion, hip flexion/marching, and long arc quad exercises. See below for exercise routine completed. Encouraged patient to complete these exercises at least 3X per day, completing 2-3 sets of 10.    Follow Up Recommendations  No OT follow up;Supervision/Assistance - 24 hour    Equipment Recommendations  Tub/shower seat    Recommendations for Other Services  none at this time    Precautions / Restrictions Precautions Precautions: Fall Restrictions Weight Bearing Restrictions: No       Mobility Bed Mobility Overal bed mobility: Needs Assistance Bed Mobility: Supine to Sit     Supine to sit: Supervision        Transfers Overall transfer level: Needs assistance Equipment used: 1 person hand held assist Transfers: Sit to/from Stand Sit to Stand: Min assist Stand pivot transfers: Min assist       General transfer comment: Patient with LOB during sit<>stand        ADL Overall ADL's : Needs assistance/impaired     Grooming: Min guard Toilet  Transfer: Min Administrator, arts and Hygiene: Min guard General ADL Comments: Patient requried up to min guard assist during ADL secondary to decreased dynamic standing balance strategies without BUE support     Vision  No apparent visual impairments         Cognition   Behavior During Therapy: WFL for tasks assessed/performed Overall Cognitive Status: Impaired/Different from baseline Area of Impairment: Orientation Orientation Level: Time      Exercises General Exercises - Upper Extremity Shoulder Flexion: AROM;10 reps;Seated (2 sets) Elbow Flexion: AROM;Strengthening;10 reps;Seated (2 sets) General Exercises - Lower Extremity Long Arc Quad: AROM;Strengthening;10 reps;Seated (2 sets) Hip Flexion/Marching: AROM;Strengthening;10 reps;Seated (2 sets)           Pertinent Vitals/ Pain       Pain Assessment: No/denies pain  Home Living  Defer to OT evaluation        Frequency Min 2X/week     Progress Toward Goals  OT Goals(current goals can now be found in the care plan section)  Progress towards OT goals: Progressing toward goals     Plan Discharge plan remains appropriate       End of Session Equipment Utilized During Treatment: Gait belt   Activity Tolerance Patient tolerated treatment well   Patient Left in chair;with call bell/phone within reach;with chair alarm set     Time: 1140-1200 OT Time Calculation (min): 20 min  Charges: OT General Charges $OT Visit: 1 Procedure OT Treatments $Self Care/Home Management : 8-22 mins  Idali Lafever , MS, OTR/L, CLT 05/15/2014, 12:16 PM

## 2014-05-15 NOTE — Progress Notes (Signed)
05/15/2014 1:22 PM  Christa See [id number]     Temp:  [97.8 F (36.6 C)-98.2 F (36.8 C)] 98.1 F (36.7 C) (10/06 0451) Pulse Rate:  [58-65] 63 (10/06 0451) Resp:  [18-22] 20 (10/06 0451) BP: (117-188)/(68-134) 127/68 mmHg (10/06 0934) SpO2:  [94 %-98 %] 95 % (10/06 0451) Weight:  [78.926 kg (174 lb)] 78.926 kg (174 lb) (10/06 0451),     Intake/Output Summary (Last 24 hours) at 05/15/14 1322 Last data filed at 05/15/14 0930  Gross per 24 hour  Intake      0 ml  Output    350 ml  Net   -350 ml    Results for orders placed during the hospital encounter of 05/10/14 (from the past 24 hour(s))  GLUCOSE, CAPILLARY     Status: None   Collection Time    05/14/14  4:48 PM      Result Value Ref Range   Glucose-Capillary 77  70 - 99 mg/dL  GLUCOSE, CAPILLARY     Status: None   Collection Time    05/14/14  7:53 PM      Result Value Ref Range   Glucose-Capillary 85  70 - 99 mg/dL   Comment 1 Notify RN     Comment 2 Documented in Chart    GLUCOSE, CAPILLARY     Status: None   Collection Time    05/15/14 12:30 AM      Result Value Ref Range   Glucose-Capillary 92  70 - 99 mg/dL   Comment 1 Notify RN     Comment 2 Documented in Chart    GLUCOSE, CAPILLARY     Status: None   Collection Time    05/15/14  4:38 AM      Result Value Ref Range   Glucose-Capillary 82  70 - 99 mg/dL   Comment 1 Notify RN     Comment 2 Documented in Chart    GLUCOSE, CAPILLARY     Status: None   Collection Time    05/15/14 12:09 PM      Result Value Ref Range   Glucose-Capillary 77  70 - 99 mg/dL   CT neck shows a large supraglottic, possibly transglottic lesion , very likely cancer. 2 RIGHT level II nodes.  Questionable thyroid cartilage involvement.    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 Alvy Bimler and Dr. Isidore Moos.  WIll attempt to get an ultrasound guided FNA RIGHT neck.  WIll discuss at tumor board tomorrow AM.  If all parties agree to treat without a  direct laryngoscopy and biopsy of the endolarynx, this may avoid precipitating an acute airway collapse with need for tracheostomy.  He will certainly need a PET scan which will help determine if the lung lesion is of concern.  He will almost certainly need a PEG tube sooner than later.  I will talk with the Hospitalists in this regard.  I will discuss with patient when more information is available.   Jodi Marble

## 2014-05-15 NOTE — Progress Notes (Addendum)
Patient Name: Donald Hughes Date of Encounter: 05/15/2014  Active Problems:   Respiratory failure, acute   Altered mental status   Septic shock   Aspiration pneumonia   Atrial fibrillation   Elevated troponin   Demand ischemia    Patient Profile: 58 yo male found unresponsive with bradycardia and poor resp 10/01, ETT x 2 days, LUL nodule on CT, failed swallow eval, cards following for demand ischemia, EF preserved by echo. Brief afib after admit. Lexiscan MV 10/06  SUBJECTIVE: No chest pain or sob.  OBJECTIVE Filed Vitals:   05/15/14 0451 05/15/14 0502 05/15/14 0645 05/15/14 0857  BP: 188/134 188/94 168/90 157/96  Pulse: 63     Temp: 98.1 F (36.7 C)     TempSrc: Oral     Resp: 20     Height:      Weight: 174 lb (78.926 kg)     SpO2: 95%       Intake/Output Summary (Last 24 hours) at 05/15/14 0921 Last data filed at 05/15/14 0300  Gross per 24 hour  Intake      0 ml  Output    350 ml  Net   -350 ml   Filed Weights   05/13/14 1319 05/14/14 0430 05/15/14 0451  Weight: 186 lb 4.8 oz (84.505 kg) 178 lb 12.8 oz (81.103 kg) 174 lb (78.926 kg)    PHYSICAL EXAM General: Well developed, well nourished, male in no acute distress. Head: Normocephalic, atraumatic.  Neck: Supple without bruits, JVD slightly elevated Lungs:  Resp regular and unlabored, decreased BS bases. Heart: RRR, S1, S2, no S3, S4, or murmur; no rub. Abdomen: Soft, non-tender, non-distended, BS + x 4.  Extremities: No clubbing, cyanosis, no edema.  Neuro: Alert and oriented X 2. Moves all extremities spontaneously. Psych: Normal affect.  LABS: CBC: Recent Labs  05/13/14 0215  WBC 12.0*  HGB 11.6*  HCT 39.8  MCV 81.9  PLT 967   Basic Metabolic Panel: Recent Labs  05/13/14 0215  NA 144  K 4.6  CL 103  CO2 34*  GLUCOSE 84  BUN 21  CREATININE 0.67  CALCIUM 9.0   Lab Results  Component Value Date   TROPONINI 5.51* 05/10/2014   BNP: Pro B Natriuretic peptide (BNP)  Date/Time  Value Ref Range Status  05/10/2014 11:30 AM 1793.0* 0 - 125 pg/mL Final   Fasting Lipid Panel: Recent Labs  05/13/14 0215  CHOL 117  HDL 26*  LDLCALC 61  TRIG 148  CHOLHDL 4.5    TELE: SR, ST, brief episodes of ?atrial tach seen. Seen in nuc med  Radiology/Studies: Ct Soft Tissue Neck W Contrast 05/15/2014   CLINICAL DATA:  58 year old male with 2 months of hoarseness without pain. No bleeding or hemoptysis. Recent smoker. Recent intubation. Now extubated. Flexible laryngoscopy performed on 05/14/2014 demonstrated visible serration on lower NG all surface of left area epiglottic fold. Epiglottis with swelling and edema.  EXAM: CT NECK WITH CONTRAST  TECHNIQUE: Multidetector CT imaging of the neck was performed using the standard protocol following the bolus administration of intravenous contrast.  COMPARISON:  Prior CTA of the neck from 05/10/2014  FINDINGS: The visualized portions of the brain and posterior fossa are unremarkable. Orbits within normal limits.  Mild polypoid opacity present within the maxillary sinuses bilaterally. Paranasal sinuses are otherwise clear. No mastoid effusion.  The salivary glands including the parotid glands and submandibular glands are within normal limits.  Oral cavity is normal in appearance without mass  lesion or loculated fluid collection. The patient is edentulous. Palatine tonsils are symmetric bilaterally. Uvula is normal. Parapharyngeal fat is preserved. Nasopharynx and oropharynx within normal limits.  No retropharyngeal fluid collection.  There is mild diffuse epiglottic swelling and edema. Additionally, there is mild edema and swelling along the aryepiglottic folds bilaterally. There is mild irregularity along the left aryepiglottic fold (series 2, image 85), which may reflect the ulceration visualized on recent laryngoscopy. There is fullness of the left piriform sinus without definite submucosal mass lesion. The right piriform sinus is preserved.  Additionally, asymmetric fullness in the region of the right pre epiglottic fat and right false vocal cord (series 2, image 81). Evaluation of this area somewhat difficult due to motion artifact. True vocal cords are fairly symmetric inferiorly. Thyroid and cricoid cartilages within normal limits. Subglottic airway is clear.  There is an enlarged right level II 8 node measuring 2 cm in short access (series 2, image 66). Central hypodensity within this note is concerning for necrosis. Adjacent mildly prominent right level II a node measures 8 mm in short axis. While this node measures within normal limits for size, central hypodensity also worrisome for necrosis. No left-sided cervical adenopathy. No supraclavicular lymph nodes.  Thyroid gland within normal limits.  Visualized portions of the lung apices are clear.  Scattered atheromatous plaque present about the carotid bifurcations and proximal internal carotid arteries, better evaluated on recent CTA.  Multilevel degenerative changes noted within the visualized spine. No worrisome lytic or blastic osseous lesions. No acute osseous abnormality.  IMPRESSION: 1. Edema with swelling within the epiglottis and aryepiglottic folds bilaterally, which may be related to recent intubation. Airway remains widely patent. 2. Subtle asymmetric irregularity along the laryngeal surface of the left aryepiglottic fold, which may reflect the ulcerative lesion seen on flexible laryngoscopy performed earlier on the same day. There is associated fullness within the adjacent left piriform sinus. 3. Apparent fullness and asymmetry within the right pre epiglottic fat extending inferiorly towards the right false vocal cord. Evaluation of this area is somewhat difficult due to motion artifact. Attention at possible future laryngoscopy/biopsy recommended. 4. Enlarged 2.0 cm necrotic right level IIa node, worrisome for nodal metastasis. An adjacent 8 mm node is also worrisome with suggestion  of central necrosis, although is within normal limits for size criteria.   Electronically Signed   By: Jeannine Boga M.D.   On: 05/15/2014 07:30     Current Medications:  . antiseptic oral rinse  7 mL Mouth Rinse QID  . aspirin  150 mg Rectal Daily  . chlorhexidine  15 mL Mouth Rinse BID  . folic acid  1 mg Intravenous Daily  . furosemide  40 mg Intravenous Once  . heparin subcutaneous  5,000 Units Subcutaneous 3 times per day  . Influenza vac split quadrivalent PF  0.5 mL Intramuscular Tomorrow-1000  . insulin aspart  0-15 Units Subcutaneous 6 times per day  . levETIRAcetam  500 mg Intravenous Q12H  . lidocaine  15 mL Mouth/Throat Once  . lisinopril  10 mg Oral Daily  . thiamine IV  100 mg Intravenous Daily      ASSESSMENT AND PLAN: Afib  - isolated episode upon presentation in the setting of severe systemic illness, converted back to NSR.  - we have not committed to long term anticoag at this point, follow for afib recurrence. None noted on tele review over the weekend. In nuclear medicine, had episodes of ?atrial tach just before and during test. Self-limiting, asymptomatic.  Review of telemetry 10/05 revealed episodes of bradycardia during the day into the upper 30's and sinus tach into the 140's. The patient reports he was sleeping some yesterday. May be related to apnea. Intubation was noted to be difficult due to laryngeal edema. Sounds like this could be contributing to apneic events. Should probably have CPAP during sleep and sleep study. Modified barium swallow today: Moderate pharyngeal phase dysphagia; Severe pharyngeal phase dysphagia.   Demand ischemia  - peak trop of 7 in setting of acute resp failure, septic shock, aspiration pneumonia, and initially afib with RVR.  - echo 05/11/14 LVEF 65-70% and vigorous, grade I diastolic dysfunction. EKG without specific ischemic changes. CT PE no PE, noted coronary atherosclerotic disease.  - based on findings likely demand  ischemia in setting of chronic obstructive CAD, presentation not consistent with acute occlusive coronary disease  - Lexiscan 10/06, as inpatient, results pending   AMS  - followed by neurology   Possible CAD - noted coronary calcifications on CT scan, unclear functionality  - continue ASA. Fairly normal lipid panel however in the setting of elevated trop with CAD on CT scan would benefit from statin. Will start atorva 80mg  daily once taking oral.   Tobacco abuse  - cessation encouraged  From Dr. Victorino December note 10/05 I have seen and examined the patient along with Tarri Fuller, PA. I have reviewed the chart, notes and new data. I agree with PA's note.  The events leading to the acute illness are quite unclear. He clearly has evidence for a major drop in systemic perfusion (hypotension) that could have been secondary to arrhythmia or sepsis or respiratory arrest.  Bradycardia followed by transient AF on day 1. Some mild sinus bradycardia during sleep and brief atrial tachycardia are only meaningful recorded arrhythmia since then.  Coincidental evidence of coronary calcifications, no symptoms of CAD, preserved EF, elevated troponin 7 in setting of respiratory arrest.  UDS negative  Signed, Rosaria Ferries , PA-C 9:21 AM 05/15/2014  I have seen and examined the patient along with Rosaria Ferries , PA-C.  I have reviewed the chart, notes and new data.  I agree with PA's note.  PLAN: Review nuclear images before deciding whether an implantable loop recorder is justified. Definitely needs a sleep study.  Sanda Klein, MD, [place] [phone] 05/15/2014, 11:51 AM  Nuclear images show low risk findings. Will discuss ILR  Sanda Klein, MD, [facility] [phone] office [phone] pager

## 2014-05-15 NOTE — Care Management Note (Addendum)
    Page 1 of 2   05/20/2014     9:45:14 AM CARE MANAGEMENT NOTE 05/20/2014  Patient:  Donald Hughes, Donald Hughes   Account Number:  [id number]  Date Initiated:  05/15/2014  Documentation initiated by:  Tomi Bamberger  Subjective/Objective Assessment:   dx acute resp failure  admit- lives with girfriend.     Action/Plan:   10/6 loop recorder/myoview  10/8- for peg tube   Anticipated DC Date:  05/19/2014   Anticipated DC Plan:  [place]  CM consult      [facility] Choice  HOME HEALTH   Choice offered to / List presented to:  C-1 Patient   DME arranged  TUBE FEEDING      DME agency  [place] arranged  HH-1 RN      [place].   Status of service:  Completed, signed off Medicare Important Message given?  NO (If response is "NO", the following Medicare IM given date fields will be blank) Date Medicare IM given:   Medicare IM given by:   Date Additional Medicare IM given:   Additional Medicare IM given by:    Discharge Disposition:  [place]  Per UR Regulation:  Reviewed for med. necessity/level of care/duration of stay  If discussed at [place] of Stay Meetings, dates discussed:   05/15/2014    Comments:  05/19/14 11:00 CM called [facility] to ensure HH and feeding supplies set up.  stephanie of [facility] verified all is arranged.  CM called RN to notify pt is ready for discharge per [facility].  No other CM needs were communicated.  Mariane Masters, BSN, [place] [phone].  05/18/14 [place], BSN [phone] Patient most likely will dc tomorrow, patient chose [facility], patient is set up with [facility] for [facility] for tube feeding (bolus feeds) Jermaine notified and Butch Penny notified with [facility]. Order is in for tube feeding already.  05/15/14 [place], BSN [phone] patient lives with girlfriend, patient with dysphagia, demand ischemia with elevated trops, asp pna, afib with rvr and cva.  Patient with difficult extubation, s/p mbs yest which rec npo, ENT recs Bx of larynx. ? CA, Patient may need a G tube, patient for loop recorder and myoview today. Patient with keppra iv q12 and lasix iv x 1.

## 2014-05-15 NOTE — Progress Notes (Signed)
[facility] PROGRESS NOTE   Donald Hughes OHY:[id number] DOB: 06/12/1956 DOA: 05/10/2014 PCP: No primary provider on file.  HPI/Subjective: Had nuclear stress test this morning, results not back. He is concerned and somewhat anxious that he might have a laryngeal cancer.  Interval history:  Donald Hughes is a 58 year old with past medical history of obesity and probable OSA, patient brought to the hospital by EMS after his girlfriend found him not breathing, he was intubated at the scene, ET tube changed again in the ED, it turn out to be a very difficult intubation. Workup showed elevated troponin/demand ischemia, MRI showed acute CVA likely secondary to severe hypotension and MRI showed focus of seizure, for which he was started on Depakote. It is unclear how patient why he arrested, no clear infection to the point will cause of this hypertension, discussed with cardiology stress test and probably loop recorder to follow.  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 patient is going to be discussed in the tumor board in the morning, recommendation to follow regarding approach for treatment and the need for PEG tube.  Assessment/Plan: Active Problems:   Respiratory failure, acute   Altered mental status   Septic shock   Aspiration pneumonia   Atrial fibrillation   Elevated troponin   Demand ischemia    Acute respiratory failure -Patient presented to the hospital after he was found unresponsive by his girlfriend. -Patient intubated at the scene and ET tube change in the ED before admission to the ICU. -Extubated on the 10/2, chest x-ray showed questionable aspiration pneumonia. -His cardiopulmonary arrest story is unclear, unsure if it's secondary to septic,  cardiac or primary pulmonary event. -Discussed with Dr. Sallyanne Kuster, stress test today, consider loop recorder as outpatient.  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 -Patient intubated by EMS, after extubation patient failed swallow evaluation. -MBS done on 05/14/14, SLP continues to recommend n.p.o. -Discussed with Dr. Erik Obey, patient will be discussed at the tumor board in the morning for further recommendation. -He will need medical oncology consultation, biopsy and likely PEG tube (decision to be made on these).  Septic shock -Patient admitted to the hospital with acute respiratory failure, intubated to protect his airway. -Was having fever, leukocytosis a low blood pressure on admission consistent with sepsis. -Patient also has elevated lactic acid of 5.6 on admission.  Demand ischemia -Patient presented with elevated troponin, peaked at 7. -Settings of acute respiratory failure, septic shock and aspiration pneumonia as well as atrial fibrillation with RVR. -Patient seen by cardiology, 2-D echo was done and showed no wall motion abnormalities. -Troponin elevation is likely secondary to demand ischemia rather than acute occlusive coronary artery disease. -Stress test is pending, consider loop recorder as outpatient.  Questionable seizures -Neurology was consulted an EEG was done and showed possible focus for seizure. -Patient started on Depakote, currently no seizure-like activity.  Acute CVA -MRI was done and showed multiple areas of restricted diffusion, likely secondary to hypotension. -This is probably a mild form of ischemic brain injury secondary to severe hypotension from septic shock/arrest. -Allow permissive hypertension, avoid hypotension or hypovolemia.  Code Status:  Family Communication: Plan discussed with the patient. Disposition Plan: Remains inpatient   Consultants:  Patient was in the PCCM  from admission until  05/13/14, [facility] assumed care on 10/15 a.m.  Procedures:  05/10/2014 insertion of arterial line by Dr. Nelda Marseille  05/10/2014 insertion of central venous catheter done by Dr. Nelda Marseille  05/10/2014 EEG showed right occipital area of potential epileptic seizure focus, read by Dr. Leonel Ramsay.  05-2014 intubation on 10/1 and extubation 10/2  Antibiotics:  None   Objective: Filed Vitals:   05/15/14 0934  BP: 127/68  Pulse:   Temp:   Resp:     Intake/Output Summary (Last 24 hours) at 05/15/14 1105 Last data filed at 05/15/14 0930  Gross per 24 hour  Intake      0 ml  Output    350 ml  Net   -350 ml   Filed Weights   05/13/14 1319 05/14/14 0430 05/15/14 0451  Weight: 84.505 kg (186 lb 4.8 oz) 81.103 kg (178 lb 12.8 oz) 78.926 kg (174 lb)    Exam: General: Alert and awake, oriented x3, not in any acute distress. HEENT: anicteric sclera, pupils reactive to light and accommodation, EOMI CVS: S1-S2 clear, no murmur rubs or gallops Chest: clear to auscultation bilaterally, no wheezing, rales or rhonchi Abdomen: soft nontender, nondistended, normal bowel sounds, no organomegaly Extremities: no cyanosis, clubbing or edema noted bilaterally Neuro: Cranial nerves II-XII intact, no focal neurological deficits  Data Reviewed: Basic Metabolic Panel:  Recent Labs Lab 05/10/14 1130 05/10/14 1309 05/10/14 2159 05/11/14 0320 05/11/14 2128 05/12/14 0304 05/13/14 0215  NA 145  --  147 145  --  146 144  K 4.3 4.1 3.6* 3.6*  --  4.8 4.6  CL 106  --  108 107  --  109 103  CO2 28  --  25 25  --  32 34*  GLUCOSE 80  --  87 110*  --  125* 84  BUN 33*  --  28* 29*  --  22 21  CREATININE 1.23  --  1.07 1.01  --  0.68 0.67  CALCIUM 7.8*  --  8.2* 8.3*  --  8.6 9.0  MG  --  1.5 1.5 1.5 2.7* 2.4  --   PHOS  --  2.1*  --  1.7*  --  3.3  --    Liver Function Tests:  Recent Labs Lab 05/10/14 1130  AST 64*  ALT 31  ALKPHOS 72  BILITOT 0.3  PROT 5.9*  ALBUMIN 2.5*   No results found  for this basename: LIPASE, AMYLASE,  in the last 168 hours  Recent Labs Lab 05/10/14 2030  AMMONIA 25   CBC:  Recent Labs Lab 05/10/14 0809 05/10/14 1130 05/11/14 0320 05/12/14 0304 05/13/14 0215  WBC 13.0* 14.9* 10.9* 11.4* 12.0*  NEUTROABS 11.2* 13.3*  --   --   --   HGB 14.3 11.2* 11.2* 11.1* 11.6*  HCT 48.4 38.1* 36.0* 37.4* 39.8  MCV 83.7 81.8 77.9* 82.0 81.9  PLT 280 183 169 170 186   Cardiac Enzymes:  Recent Labs Lab 05/10/14 0937 05/10/14 1130 05/10/14 1615 05/10/14 2159 05/10/14 2230  CKTOTAL 371*  --   --  686*  --   CKMB  --   --   --  19.6*  --   TROPONINI 0.56* 2.41* 7.08*  --  5.51*   BNP (last 3 results)  Recent Labs  05/10/14 1130  PROBNP 1793.0*   CBG:  Recent Labs Lab 05/14/14 1151 05/14/14 1648 05/14/14 1953 05/15/14 0030 05/15/14 0438  GLUCAP 86 77 85 92 82    Micro Recent  Results (from the past 240 hour(s))  URINE CULTURE     Status: None   Collection Time    05/10/14  8:17 AM      Result Value Ref Range Status   Specimen Description URINE, CATHETERIZED   Final   Special Requests NONE   Final   Culture  Setup Time     Final   Value: 05/10/2014 12:58     Performed at [place]     Final   Value: NO GROWTH     Performed at [facility]   Culture     Final   Value: NO GROWTH     Performed at [facility]   Report Status 05/11/2014 FINAL   Final  CULTURE, BLOOD (ROUTINE X 2)     Status: None   Collection Time    05/10/14  8:21 AM      Result Value Ref Range Status   Specimen Description BLOOD LEFT WRIST   Final   Special Requests BOTTLES DRAWN AEROBIC ONLY 2MLS   Final   Culture  Setup Time     Final   Value: 05/10/2014 12:55     Performed at [facility]   Culture     Final   Value:        BLOOD CULTURE RECEIVED NO GROWTH TO DATE CULTURE WILL BE HELD FOR 5 DAYS BEFORE ISSUING A FINAL NEGATIVE REPORT     Performed at [facility]   Report Status PENDING    Incomplete  CULTURE, BLOOD (ROUTINE X 2)     Status: None   Collection Time    05/10/14  8:35 AM      Result Value Ref Range Status   Specimen Description BLOOD RIGHT FOREARM   Final   Special Requests BOTTLES DRAWN AEROBIC ONLY 3MLS   Final   Culture  Setup Time     Final   Value: 05/10/2014 12:54     Performed at [facility]   Culture     Final   Value:        BLOOD CULTURE RECEIVED NO GROWTH TO DATE CULTURE WILL BE HELD FOR 5 DAYS BEFORE ISSUING A FINAL NEGATIVE REPORT     Performed at [facility]   Report Status PENDING   Incomplete  CULTURE, RESPIRATORY (NON-EXPECTORATED)     Status: None   Collection Time    05/10/14 11:30 AM      Result Value Ref Range Status   Specimen Description TRACHEAL ASPIRATE   Final   Special Requests NONE   Final   Gram Stain     Final   Value: FEW WBC PRESENT,BOTH PMN AND MONONUCLEAR     RARE SQUAMOUS EPITHELIAL CELLS PRESENT     NO ORGANISMS SEEN     Performed at [facility]   Culture     Final   Value: MODERATE STREPTOCOCCUS,BETA HEMOLYTIC NOT GROUP A     Performed at [facility]   Report Status 05/13/2014 FINAL   Final  MRSA PCR SCREENING     Status: None   Collection Time    05/10/14 12:54 PM      Result Value Ref Range Status   MRSA by PCR NEGATIVE  NEGATIVE Final   Comment:            The GeneXpert MRSA Assay (FDA     approved for NASAL specimens     only), is  one component of a     comprehensive MRSA colonization     surveillance program. It is not     intended to diagnose MRSA     infection nor to guide or     monitor treatment for     MRSA infections.     Studies: Ct Soft Tissue Neck W Contrast  05/15/2014   CLINICAL DATA:  58 year old male with 2 months of hoarseness without pain. No bleeding or hemoptysis. Recent smoker. Recent intubation. Now extubated. Flexible laryngoscopy performed on 05/14/2014 demonstrated visible serration on lower NG all surface of left area epiglottic fold.  Epiglottis with swelling and edema.  EXAM: CT NECK WITH CONTRAST  TECHNIQUE: Multidetector CT imaging of the neck was performed using the standard protocol following the bolus administration of intravenous contrast.  COMPARISON:  Prior CTA of the neck from 05/10/2014  FINDINGS: The visualized portions of the brain and posterior fossa are unremarkable. Orbits within normal limits.  Mild polypoid opacity present within the maxillary sinuses bilaterally. Paranasal sinuses are otherwise clear. No mastoid effusion.  The salivary glands including the parotid glands and submandibular glands are within normal limits.  Oral cavity is normal in appearance without mass lesion or loculated fluid collection. The patient is edentulous. Palatine tonsils are symmetric bilaterally. Uvula is normal. Parapharyngeal fat is preserved. Nasopharynx and oropharynx within normal limits.  No retropharyngeal fluid collection.  There is mild diffuse epiglottic swelling and edema. Additionally, there is mild edema and swelling along the aryepiglottic folds bilaterally. There is mild irregularity along the left aryepiglottic fold (series 2, image 85), which may reflect the ulceration visualized on recent laryngoscopy. There is fullness of the left piriform sinus without definite submucosal mass lesion. The right piriform sinus is preserved. Additionally, asymmetric fullness in the region of the right pre epiglottic fat and right false vocal cord (series 2, image 81). Evaluation of this area somewhat difficult due to motion artifact. True vocal cords are fairly symmetric inferiorly. Thyroid and cricoid cartilages within normal limits. Subglottic airway is clear.  There is an enlarged right level II 8 node measuring 2 cm in short access (series 2, image 66). Central hypodensity within this note is concerning for necrosis. Adjacent mildly prominent right level II a node measures 8 mm in short axis. While this node measures within normal limits for  size, central hypodensity also worrisome for necrosis. No left-sided cervical adenopathy. No supraclavicular lymph nodes.  Thyroid gland within normal limits.  Visualized portions of the lung apices are clear.  Scattered atheromatous plaque present about the carotid bifurcations and proximal internal carotid arteries, better evaluated on recent CTA.  Multilevel degenerative changes noted within the visualized spine. No worrisome lytic or blastic osseous lesions. No acute osseous abnormality.  IMPRESSION: 1. Edema with swelling within the epiglottis and aryepiglottic folds bilaterally, which may be related to recent intubation. Airway remains widely patent. 2. Subtle asymmetric irregularity along the laryngeal surface of the left aryepiglottic fold, which may reflect the ulcerative lesion seen on flexible laryngoscopy performed earlier on the same day. There is associated fullness within the adjacent left piriform sinus. 3. Apparent fullness and asymmetry within the right pre epiglottic fat extending inferiorly towards the right false vocal cord. Evaluation of this area is somewhat difficult due to motion artifact. Attention at possible future laryngoscopy/biopsy recommended. 4. Enlarged 2.0 cm necrotic right level IIa node, worrisome for nodal metastasis. An adjacent 8 mm node is also worrisome with suggestion of central necrosis, although is within normal  limits for size criteria.   Electronically Signed   By: Jeannine Boga M.D.   On: 05/15/2014 07:30    Scheduled Meds: . antiseptic oral rinse  7 mL Mouth Rinse QID  . aspirin  150 mg Rectal Daily  . chlorhexidine  15 mL Mouth Rinse BID  . folic acid  1 mg Intravenous Daily  . furosemide  40 mg Intravenous Once  . heparin subcutaneous  5,000 Units Subcutaneous 3 times per day  . Influenza vac split quadrivalent PF  0.5 mL Intramuscular Tomorrow-1000  . insulin aspart  0-15 Units Subcutaneous 6 times per day  . levETIRAcetam  500 mg Intravenous  Q12H  . lidocaine  15 mL Mouth/Throat Once  . lisinopril  10 mg Oral Daily  . thiamine IV  100 mg Intravenous Daily   Continuous Infusions:      Time spent: 35 minutes    [facility] A  [facility] Pager [phone] If 7PM-7AM, please contact night-coverage at [email or web address], password [facility] 05/15/2014, 11:05 AM  LOS: 5 days

## 2014-05-15 NOTE — Consult Note (Signed)
Referring Physician(s): Dr. Erik Obey  Subjective: 58 yo gentleman who presented after being found unconscious at home. Was intubated for a few days, then extubated and reported hoarseness present for 2 mos prior to admission/intubation. Workup has found a large supraglottic mass suspicious for malignancy, ENT consulted and scan reviewed. Request for IR to do biopsy of enlarged right cervical node in hopes to obtain tissue diagnosis without having to do airway biopsy/tracheostomy Imaging reviewed by Dr. Barbie Banner Chart, PMHx, meds list reviewed.  Past Medical History  Diagnosis Date  . Difficult intubation 05/10/14    laryngeal edema, difficult DL and video laryngoscopy   History reviewed. No pertinent past surgical history.  History   Social History  . Marital Status: Legally Separated    Spouse Name: N/A    Number of Children: N/A  . Years of Education: N/A   Occupational History  . Not on file.   Social History Main Topics  . Smoking status: Former Research scientist (life sciences)  . Smokeless tobacco: Not on file     Comment: "I quit when I came in here."  . Alcohol Use: Not on file  . Drug Use: Not on file  . Sexual Activity: Not on file   Other Topics Concern  . Not on file   Social History Narrative  . No narrative on file    Allergies: Review of patient's allergies indicates no known allergies.  Medications: Current facility-administered medications:antiseptic oral rinse (CPC / CETYLPYRIDINIUM CHLORIDE 0.05%) solution 7 mL, 7 mL, Mouth Rinse, QID, Rush Farmer, MD, 7 mL at 05/15/14 1113;  aspirin suppository 150 mg, 150 mg, Rectal, Daily, Chesley Mires, MD, 150 mg at 05/15/14 1105;  chlorhexidine (PERIDEX) 0.12 % solution 15 mL, 15 mL, Mouth Rinse, BID, Rush Farmer, MD, 15 mL at 26/83/41 9622 folic acid injection 1 mg, 1 mg, Intravenous, Daily, Grace Bushy Minor, NP, 1 mg at 05/15/14 1317;  heparin injection 5,000 Units, 5,000 Units, Subcutaneous, 3 times per day, Rush Farmer, MD,  5,000 Units at 05/15/14 1317;  hydrALAZINE (APRESOLINE) injection 10 mg, 10 mg, Intravenous, Q2H PRN, Colbert Coyer, MD, 10 mg at 05/15/14 0502 Influenza vac split quadrivalent PF (FLUARIX) injection 0.5 mL, 0.5 mL, Intramuscular, Tomorrow-1000, Rush Farmer, MD;  insulin aspart (novoLOG) injection 0-15 Units, 0-15 Units, Subcutaneous, 6 times per day, Marijean Heath, NP, 2 Units at 05/11/14 1700;  ipratropium-albuterol (DUONEB) 0.5-2.5 (3) MG/3ML nebulizer solution 3 mL, 3 mL, Nebulization, Q2H PRN, Chesley Mires, MD, 3 mL at 05/13/14 1213 labetalol (NORMODYNE,TRANDATE) injection 20 mg, 20 mg, Intravenous, Q10 min PRN, Juanito Doom, MD, 20 mg at 05/13/14 0220;  levETIRAcetam (KEPPRA) IVPB 500 mg/100 mL premix, 500 mg, Intravenous, Q12H, Roland Rack, MD, 500 mg at 05/15/14 1113;  lidocaine (XYLOCAINE) 2 % viscous mouth solution 15 mL, 15 mL, Mouth/Throat, Once, Jodi Marble, MD;  lisinopril (PRINIVIL,ZESTRIL) tablet 10 mg, 10 mg, Oral, Daily, Verlee Monte, MD thiamine (B-1) injection 100 mg, 100 mg, Intravenous, Daily, William S Minor, NP, 100 mg at 05/15/14 1105   Review of Systems  HENT: Positive for trouble swallowing and voice change.   All other systems reviewed and are negative.   Vital Signs: BP 104/75  Pulse 82  Temp(Src) 97.7 F (36.5 C) (Oral)  Resp 18  Ht 5\' 5"  (1.651 m)  Wt 174 lb (78.926 kg)  BMI 28.96 kg/m2  SpO2 93%  Physical Exam  Constitutional: He is oriented to person, place, and time. He appears well-developed. No distress.  HENT:  Head: Atraumatic.  Neck: Normal range of motion. No tracheal deviation present.  Palpable, NT right cervical node  Cardiovascular: Normal rate, regular rhythm and normal heart sounds.   Pulmonary/Chest: Effort normal and breath sounds normal. No respiratory distress.  Abdominal: Soft. There is no tenderness.  Lymphadenopathy:    He has cervical adenopathy.  Neurological: He is alert and oriented to person,  place, and time.  Psychiatric: He has a normal mood and affect. Judgment normal.    Imaging: Ct Soft Tissue Neck W Contrast  05/15/2014   CLINICAL DATA:  58 year old male with 2 months of hoarseness without pain. No bleeding or hemoptysis. Recent smoker. Recent intubation. Now extubated. Flexible laryngoscopy performed on 05/14/2014 demonstrated visible serration on lower NG all surface of left area epiglottic fold. Epiglottis with swelling and edema.  EXAM: CT NECK WITH CONTRAST  TECHNIQUE: Multidetector CT imaging of the neck was performed using the standard protocol following the bolus administration of intravenous contrast.  COMPARISON:  Prior CTA of the neck from 05/10/2014  FINDINGS: The visualized portions of the brain and posterior fossa are unremarkable. Orbits within normal limits.  Mild polypoid opacity present within the maxillary sinuses bilaterally. Paranasal sinuses are otherwise clear. No mastoid effusion.  The salivary glands including the parotid glands and submandibular glands are within normal limits.  Oral cavity is normal in appearance without mass lesion or loculated fluid collection. The patient is edentulous. Palatine tonsils are symmetric bilaterally. Uvula is normal. Parapharyngeal fat is preserved. Nasopharynx and oropharynx within normal limits.  No retropharyngeal fluid collection.  There is mild diffuse epiglottic swelling and edema. Additionally, there is mild edema and swelling along the aryepiglottic folds bilaterally. There is mild irregularity along the left aryepiglottic fold (series 2, image 85), which may reflect the ulceration visualized on recent laryngoscopy. There is fullness of the left piriform sinus without definite submucosal mass lesion. The right piriform sinus is preserved. Additionally, asymmetric fullness in the region of the right pre epiglottic fat and right false vocal cord (series 2, image 81). Evaluation of this area somewhat difficult due to motion  artifact. True vocal cords are fairly symmetric inferiorly. Thyroid and cricoid cartilages within normal limits. Subglottic airway is clear.  There is an enlarged right level II 8 node measuring 2 cm in short access (series 2, image 66). Central hypodensity within this note is concerning for necrosis. Adjacent mildly prominent right level II a node measures 8 mm in short axis. While this node measures within normal limits for size, central hypodensity also worrisome for necrosis. No left-sided cervical adenopathy. No supraclavicular lymph nodes.  Thyroid gland within normal limits.  Visualized portions of the lung apices are clear.  Scattered atheromatous plaque present about the carotid bifurcations and proximal internal carotid arteries, better evaluated on recent CTA.  Multilevel degenerative changes noted within the visualized spine. No worrisome lytic or blastic osseous lesions. No acute osseous abnormality.  IMPRESSION: 1. Edema with swelling within the epiglottis and aryepiglottic folds bilaterally, which may be related to recent intubation. Airway remains widely patent. 2. Subtle asymmetric irregularity along the laryngeal surface of the left aryepiglottic fold, which may reflect the ulcerative lesion seen on flexible laryngoscopy performed earlier on the same day. There is associated fullness within the adjacent left piriform sinus. 3. Apparent fullness and asymmetry within the right pre epiglottic fat extending inferiorly towards the right false vocal cord. Evaluation of this area is somewhat difficult due to motion artifact. Attention at possible future laryngoscopy/biopsy recommended. 4. Enlarged  2.0 cm necrotic right level IIa node, worrisome for nodal metastasis. An adjacent 8 mm node is also worrisome with suggestion of central necrosis, although is within normal limits for size criteria.   Electronically Signed   By: Jeannine Boga M.D.   On: 05/15/2014 07:30   Mr Brain Wo  Contrast  05/12/2014   CLINICAL DATA:  58 year old male last seen normal at 1 a.m. on 05/10/2014. Found unresponsive. On admission was unresponsive with bradycardia, and atrial fibrillation. No known past medical history. Improving altered mental status.  EXAM: MRI HEAD WITHOUT CONTRAST  TECHNIQUE: Multiplanar, multiecho pulse sequences of the brain and surrounding structures were obtained without intravenous contrast.  COMPARISON:  CT head 05/10/2014.  CT angio head and neck 05/10/2014.  FINDINGS: Motion degraded exam.  Overall study diagnostic.  Widespread areas of restricted diffusion affecting primarily the BILATERAL globus pallidus, other deep nuclei, and periventricular white matter, predominantly supratentorial, but with involvement of the cerebellum, consistent with a hypoxic/ischemic and/or hypotensive insult. Carbon monoxide poisoning can also have this appearance, but given the punctate deep white matter areas of restricted diffusion, is less favored. No hemorrhage, mass lesion, hydrocephalus, or extra-axial fluid. Premature for age atrophy. Mild subcortical and periventricular T2 and FLAIR hyperintensities, likely chronic microvascular ischemic change. 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 Flow voids are maintained in the carotid, basilar, and vertebral arteries. There is no obvious head trauma or osseous lesion. Upper cervical region unremarkable.  Compared with prior CT studies, the these changes are not visible.  IMPRESSION: Widespread areas of restricted diffusion involving both the deep nuclei (predominately globus pallidus) as well as the deep white matter in a watershed pattern. Findings are consistent with a hypoxic/ischemic and/or hypertensive insult. Carbon monoxide poisoning has some similar imaging features; see discussion above.   Electronically Signed   By: Rolla Flatten M.D.   On: 05/12/2014  10:46   Nm Myocar Multi W/spect W/wall Motion / Ef  05/15/2014   CLINICAL DATA:  58 year old male recently admitted with respiratory failure, aspiration pneumonia and septic shock. Additionally, had atrial fibrillation with rapid ventricular response and elevated troponins thought to be related O2 demand ischemia from the AFib with RVR. Nuclear medicine cardiac stress test warranted to evaluate for reversible ischemia.  EXAM: MYOCARDIAL IMAGING WITH SPECT (REST AND PHARMACOLOGIC-STRESS)  GATED LEFT VENTRICULAR WALL MOTION STUDY  LEFT VENTRICULAR EJECTION FRACTION  TECHNIQUE: Standard myocardial SPECT imaging was performed after resting intravenous injection of 10 mCi Tc-58m sestamibi. Subsequently, intravenous infusion of Lexiscan was performed under the supervision of the Cardiology staff. At peak effect of the drug, 30 mCi Tc-16m sestamibi was injected intravenously and standard myocardial SPECT imaging was performed. Quantitative gated imaging was also performed to evaluate left ventricular wall motion, and estimate left ventricular ejection fraction.  COMPARISON:  Chest x-ray 05/11/2014  FINDINGS: Perfusion: No decreased activity in the left ventricle on stress imaging to suggest reversible ischemia or infarction. Mildly decreased radiotracer uptake in the inferior wall on both the resting and stress images likely represents diaphragmatic attenuation in a male patient.  Wall Motion: Normal left ventricular wall motion. No left ventricular dilation.  Left Ventricular Ejection Fraction: 61 %  End diastolic volume 76 ml  End systolic volume 29 ml  IMPRESSION: 1. No reversible ischemia or infarction.  2. Normal left ventricular wall motion.  3. Left ventricular ejection fraction 61%  4. Low-risk stress test findings*.  *2012 Appropriate Use  Criteria for Coronary Revascularization Focused Update: J Am Coll Cardiol. 3888;28(0):034-917. http://content.airportbarriers.com.aspx?articleid=1201161   Electronically  Signed   By: Jacqulynn Cadet M.D.   On: 05/15/2014 11:15    Labs:  CBC:  Recent Labs  05/10/14 1130 05/11/14 0320 05/12/14 0304 05/13/14 0215  WBC 14.9* 10.9* 11.4* 12.0*  HGB 11.2* 11.2* 11.1* 11.6*  HCT 38.1* 36.0* 37.4* 39.8  PLT 183 169 170 186    COAGS:  Recent Labs  05/10/14 1130  INR 1.37  APTT 27    BMP:  Recent Labs  05/10/14 2159 05/11/14 0320 05/12/14 0304 05/13/14 0215  NA 147 145 146 144  K 3.6* 3.6* 4.8 4.6  CL 108 107 109 103  CO2 25 25 32 34*  GLUCOSE 87 110* 125* 84  BUN 28* 29* 22 21  CALCIUM 8.2* 8.3* 8.6 9.0  CREATININE 1.07 1.01 0.68 0.67  GFRNONAA 76* 81* >90 >90  GFRAA 88* >90 >90 >90    LIVER FUNCTION TESTS:  Recent Labs  05/10/14 1130  BILITOT 0.3  AST 64*  ALT 31  ALKPHOS 72  PROT 5.9*  ALBUMIN 2.5*    Assessment and Plan:  Supraglottic mass highly suspicious for malignancy   Plan for US guided biopsy (R)cervical LN NPO p MN Hold am Heparin Discussed procedure, risks, complications with pt Consent signed in chart   I spent a total of 20 minutes face to face in clinical consultation/evaluation, greater than 50% of which was counseling/coordinating care for US biopsy of cervical LN  Signed: Ascencion Dike 05/15/2014, 3:47 PM

## 2014-05-15 NOTE — Progress Notes (Signed)
Physical Therapy Treatment Patient Details Name: Donald Hughes MRN: [id number] DOB: 06/03/1956 Today's Date: 05/15/2014    History of Present Illness 58 yo male smoker found unresponsive by roommate >> noted to have bradycardia and then A fib with RVR, and intubated for airway protection.VDRF 10/1-10/2     PT Comments    Progressing slowly.  Pt fatigued quickly, but persevered through activities including standing exercise with HR in upper 90's and lower 100's.  Mild to moderate dyspnea with exertion.   Follow Up Recommendations  Home health PT     Equipment Recommendations  None recommended by PT    Recommendations for Other Services       Precautions / Restrictions Precautions Precautions: Fall Restrictions Weight Bearing Restrictions: No    Mobility  Bed Mobility Overal bed mobility: Needs Assistance Bed Mobility: Supine to Sit;Sit to Supine     Supine to sit: Supervision Sit to supine: Supervision   General bed mobility comments: increased time and effort; pt unable to scoot himself up in bed.  Transfers Overall transfer level: Needs assistance Equipment used: None Transfers: Sit to/from Stand Sit to Stand: Min guard Stand pivot transfers: Min guard       General transfer comment: slow and mildly effortful, but no assist needed  Ambulation/Gait Ambulation/Gait assistance: Min guard Ambulation Distance (Feet): 400 Feet Assistive device: None (or railing) Gait Pattern/deviations: Step-through pattern;Drifts right/left;Wide base of support Gait velocity: very slow, little increase in speed when cued   General Gait Details: rubbery-kneed gait, mildly unsteady, but reaches for rail appropriately when needed   Stairs            Wheelchair Mobility    Modified Rankin (Stroke Patients Only)       Balance Overall balance assessment: Needs assistance   Sitting balance-Leahy Scale: Good     Standing balance support: No upper extremity  supported Standing balance-Leahy Scale: Fair                      Cognition Arousal/Alertness: Awake/alert Behavior During Therapy: WFL for tasks assessed/performed Overall Cognitive Status: Impaired/Different from baseline Area of Impairment: Orientation Orientation Level: Time                  Exercises General Exercises - Upper Extremity Shoulder Flexion: AROM;10 reps;Seated (2 sets) Elbow Flexion: AROM;Strengthening;10 reps;Seated (2 sets) General Exercises - Lower Extremity Long Arc Quad: AROM;Strengthening;10 reps;Seated (2 sets) Hip ABduction/ADduction: AROM;Strengthening;Both;10 reps;Standing Hip Flexion/Marching: AROM;Strengthening;Both;10 reps;Standing Mini-Sqauts: AROM;Both;10 reps;Standing    General Comments        Pertinent Vitals/Pain Pain Assessment: No/denies pain    Home Living                      Prior Function            PT Goals (current goals can now be found in the care plan section) Acute Rehab PT Goals PT Goal Formulation: With patient Time For Goal Achievement: 05/27/14 Potential to Achieve Goals: Good Progress towards PT goals: Progressing toward goals    Frequency  Min 3X/week    PT Plan Current plan remains appropriate    Co-evaluation             End of Session   Activity Tolerance: Patient tolerated treatment well Patient left: in bed;with call bell/phone within reach;with bed alarm set     Time: 4742-5956 PT Time Calculation (min): 24 min  Charges:  $Gait Training: 8-22 mins $Therapeutic Exercise:  8-22 mins                    G Codes:      Lilybeth Vien, Tessie Fass 05/15/2014, 2:36 PM 05/15/2014  Donnella Sham, PT [phone]  (pager)

## 2014-05-15 NOTE — Progress Notes (Signed)
Hypoglycemic Event  CBG: 63  Treatment: D50 IV 25 mL  Symptoms: Pale and Hungry  Follow-up CBG: Time:2030 CBG Result:148  Possible Reasons for Event: Inadequate meal intake  Comments/MD notified:NP, Zachery Dauer, April L  Remember to initiate Hypoglycemia Order Set & complete

## 2014-05-15 NOTE — Progress Notes (Signed)
Speech Language Pathology Treatment: Dysphagia  Patient Details Name: Donald Hughes MRN: [id number] DOB: 06/25/1956 Today's Date: 05/15/2014 Time: 1497-0263 SLP Time Calculation (min): 26 min  Assessment / Plan / Recommendation Clinical Impression  Pt seen for continued dysphagia treatment. Pt appeared to have increased cough at baseline today as compared to yesterday; reports drinking "water" for study this morning (contrast?), and that he has coughed since that time, which is worrisome for aspiration event. SLP reviewed MBS results with patient and provided set-up assist for him to perform oral care via suctioning. Pt consumed ice chips with MIn cues provided by SLP for one at a time with effortful swallow to increase safety. These trials resulted in very strong, reflexive cough response. Three trials of puree, 1/2 tsp each, were consumed without overt signs of aspiration.  SLP introduced pharyngeal and laryngeal strengthening exercises with demonstrates and verbal/written instructions, in order to utilize swallowing musculature while he remains NPO. Pt  Performed exercises with Min cues. Of note, attempts at a high-pitched /i/ yielded minimal increase in frequency, further indicative of impaired glottal closure. Will continue to follow and assist as further w/u is underway for suspected malignancy. Pt will need OP SLP services upon discharge.   HPI HPI: 58 yo male adm to [facility] after being found unresponsive, he required intubation in the ED and intubation was difficult per notes.   Difficult extubation ? due to COPD per MD note.  Per MRI 10/3, pt with widespread restricted diffusion predominately in globus pallidus that may be consistent with hypoxia, ischemic event.  CXR negative for acute changes 05/12/14.     Pertinent Vitals Pain Assessment: No/denies pain  SLP Plan  Continue with current plan of care    Recommendations Diet recommendations: NPO Medication Administration: Via alternative  means              Oral Care Recommendations: Oral care Q4 per protocol;Oral care prior to ice chips Follow up Recommendations: Outpatient SLP Plan: Continue with current plan of care    GO      [name], [name] CCC-SLP [phone]  [name] 05/15/2014, 3:24 PM

## 2014-05-15 NOTE — Progress Notes (Unsigned)
Head and Neck Cancer Location of Tumor / Histology:   [name] was found unconscious at home. Intubated x 2 days, then extubated. Breathing OK. Hoarseness x 2+ mos. No pain. No clear progression. No bleeding or hemoptysis. No neck masses.   CT chest shows LEFT upper lung 8 mm nodule.    CT of Neck There is mild diffuse epiglottic swelling and edema. Additionally,  there is mild edema and swelling along the aryepiglottic folds  bilaterally. There is mild irregularity along the left aryepiglottic  fold (series 2, image 85), which may reflect the ulceration  visualized on recent laryngoscopy.  fullness and asymmetry within the right pre epiglottic  fat extending inferiorly towards the right false vocal cord  Enlarged 2.0 cm necrotic right level IIa node, worrisome for  nodal metastasis. An adjacent 8 mm node is also worrisome with  suggestion of central necrosis, although is within normal limits for  size criteria.   Biopsies of (if applicable) revealed: None at Present Time  Nutrition Status:  Weight changes:   Swallowing status: Failed swallow eval  Plans, if any, for PEG tube:  Tobacco/Marijuana/Snuff/ETOH use: Stopped smoking cigarettes 1 mo ago. Smokes marijuana   Past/Anticipated interventions by otolaryngology, if any: Dr. Jodi Marble - [facility]  Past/Anticipated interventions by medical oncology, if any: Unknown at present time  Referrals yet, to any of the following?  Social Work?  Dentistry?}  Swallowing therapy?   Nutrition?   Med/Onc?   PEG placement?   SAFETY ISSUES:  Prior radiation? No  Pacemaker/ICD? No  Possible current pregnancy? No  Is the patient on methotrexate? No  Current Complaints / other details:  Bradycardia upon initial assessment. "Brief Atrial Fibrillation after admission."

## 2014-05-15 NOTE — Consult Note (Signed)
Agree.  Will proceed with right cervical LN biopsy tomorrow.

## 2014-05-15 NOTE — Consult Note (Signed)
Radiation Oncology         [phone] ________________________________  Initial inpatient Consultation   Name: Donald Hughes MRN: [id number]  Date: 05/10/2014  DOB: [birth year]  CC:No primary provider on file.  No ref. provider found   REFERRING PHYSICIAN: Jodi Marble MD  DIAGNOSIS:  Supraglottic Cancer, Stage IVA, T3N2bM0, biopsy pending  HISTORY OF PRESENT ILLNESS::Donald Hughes is a 58 y.o. male who presented with unconsciousness at home.  It is possible he had a CVA, per imaging of his brain. He has been hoarse x 2 or more months and has a smoking history. Dr Erik Obey has seen him and we have spoken together about him at tumor board as of this morning.  CT neck notable for large laryngeal mass and two suspicious right cervical nodes (2cm, 81m). He has also has a CT angio of the Chest with an 876mlung nodule. No PET to date, nor any biopsy.    Dr WoErik Obeylans for a PEG tube. Patient reports dysphagia, is NPO, and is at significant aspiration risk, to my understanding from speaking with Dr WoErik Obeynd reviewing the notes.  To achieve a biopsy of the larynx, patient could be at risk of airway collapse with need for trach.  Patient also reports weight loss, recently. [place] treatment close to home. He denies difficulty breathing.   PAST MEDICAL HISTORY:  has a past medical history of Difficult intubation (05/10/14).    PAST SURGICAL HISTORY:History reviewed. No pertinent past surgical history.  FAMILY HISTORY: family history is not on file.  SOCIAL HISTORY:  reports that he has quit smoking. He does not have any smokeless tobacco history on file. He reports that he uses illicit drugs (Marijuana).  ALLERGIES: Review of patient's allergies indicates no known allergies.  MEDICATIONS:  Current Facility-Administered Medications  Medication Dose Route Frequency Provider Last Rate Last Dose  . antiseptic oral rinse (CPC / CETYLPYRIDINIUM  CHLORIDE 0.05%) solution 7 mL  7 mL Mouth Rinse QID WeRush FarmerMD   7 mL at 05/16/14 1200  . aspirin suppository 150 mg  150 mg Rectal Daily ViChesley MiresMD   150 mg at 05/15/14 1105  . chlorhexidine (PERIDEX) 0.12 % solution 15 mL  15 mL Mouth Rinse BID WeRush FarmerMD   15 mL at 05/16/14 0756  . dextrose 5 %-0.45 % sodium chloride infusion   Intravenous Continuous KaRhetta Murachorr, NP 75 mL/hr at 05/16/14 0241    . fentaNYL (SUBLIMAZE) 0.05 MG/ML injection           . folic acid injection 1 mg  1 mg Intravenous Daily WiGrace Bushyinor, NP   1 mg at 05/16/14 1113  . [START ON 05/17/2014] heparin injection 5,000 Units  5,000 Units Subcutaneous 3 times per day KoHedy JacobPA-C      . hydrALAZINE (APRESOLINE) injection 10 mg  10 mg Intravenous Q2H PRN ElColbert CoyerMD   10 mg at 05/15/14 0502  . Influenza vac split quadrivalent PF (FLUARIX) injection 0.5 mL  0.5 mL Intramuscular Tomorrow-1000 WeRush FarmerMD      . insulin aspart (novoLOG) injection 0-15 Units  0-15 Units Subcutaneous 6 times per day KaMarijean HeathNP   2 Units at 05/11/14 1700  . ipratropium-albuterol (DUONEB) 0.5-2.5 (3) MG/3ML nebulizer solution 3 mL  3 mL Nebulization Q2H PRN ViChesley MiresMD   3 mL at 05/13/14 1213  . labetalol (NORMODYNE,TRANDATE) injection  20 mg  20 mg Intravenous Q10 min PRN Juanito Doom, MD   20 mg at 05/13/14 0220  . levETIRAcetam (KEPPRA) IVPB 500 mg/100 mL premix  500 mg Intravenous Q12H Roland Rack, MD   500 mg at 05/16/14 1113  . lidocaine (PF) (XYLOCAINE) 1 % injection           . lidocaine (XYLOCAINE) 2 % viscous mouth solution 15 mL  15 mL Mouth/Throat Once Jodi Marble, MD      . lisinopril (PRINIVIL,ZESTRIL) tablet 10 mg  10 mg Oral Daily Verlee Monte, MD      . midazolam (VERSED) 2 MG/2ML injection           . thiamine (B-1) injection 100 mg  100 mg Intravenous Daily Grace Bushy Minor, NP   100 mg at 05/16/14 1113    REVIEW OF SYSTEMS:  Notable for that  above.   PHYSICAL EXAM:  height is 5' 5"  (1.651 m) and weight is 172 lb 4.8 oz (78.155 kg). His oral temperature is 97.5 F (36.4 C). His blood pressure is 136/88 and his pulse is 71. His respiration is 18 and oxygen saturation is 96%.   General: Alert and oriented, in no acute distress; hoarse. No stridor HEENT: Head is normocephalic. Extraocular movements are intact. Oropharynx is clear. Neck: Neck with right level II palpable firm node, no other palpable cervical or supraclavicular lymphadenopathy. Heart: Regular in rate and rhythm with no murmurs, rubs, or gallops. Chest: Clear to auscultation anteriorly, with no rhonchi, wheezes, or rales. Abdomen: Soft, nontender, nondistended, with no rigidity or guarding. Extremities: No cyanosis or edema. Lymphatics: see neck Neurologic: Cranial nerves II through XII are grossly intact. No obvious focalities. Speech is hoarse but fluent.  Psychiatric: Judgment and insight are intact. Affect is appropriate.   ECOG = 2  0 - Asymptomatic (Fully active, able to carry on all predisease activities without restriction)  1 - Symptomatic but completely ambulatory (Restricted in physically strenuous activity but ambulatory and able to carry out work of a light or sedentary nature. For example, light housework, office work)  2 - Symptomatic, <50% in bed during the day (Ambulatory and capable of all self care but unable to carry out any work activities. Up and about more than 50% of waking hours)  3 - Symptomatic, >50% in bed, but not bedbound (Capable of only limited self-care, confined to bed or chair 50% or more of waking hours)  4 - Bedbound (Completely disabled. Cannot carry on any self-care. Totally confined to bed or chair)  5 - Death   Eustace Pen MM, Creech RH, Tormey DC, et al. [phone]). "Toxicity and response criteria of the [facility]". [place] Oncol. 5 (6): 649-55   LABORATORY DATA:  Lab Results  Component Value Date     WBC 8.8 05/16/2014   HGB 14.4 05/16/2014   HCT 45.3 05/16/2014   MCV 77.7* 05/16/2014   PLT 193 05/16/2014   CMP     Component Value Date/Time   NA 140 05/16/2014 0547   K 3.3* 05/16/2014 0547   CL 95* 05/16/2014 0547   CO2 35* 05/16/2014 0547   GLUCOSE 100* 05/16/2014 0547   BUN 23 05/16/2014 0547   CREATININE 0.63 05/16/2014 0547   CALCIUM 9.2 05/16/2014 0547   PROT 5.9* 05/10/2014 1130   ALBUMIN 2.5* 05/10/2014 1130   AST 64* 05/10/2014 1130   ALT 31 05/10/2014 1130   ALKPHOS 72 05/10/2014 1130   BILITOT 0.3 05/10/2014  1130   GFRNONAA >90 05/16/2014 0547   GFRAA >90 05/16/2014 0547         RADIOGRAPHY: Ct Angio Head W/cm &/or Wo Cm  05/10/2014   CLINICAL DATA:  INITIAL ENCOUNTER. CODE STROKE. Patient found unresponsive at 6 o'clock a.m. Last seen well at 1 o'clock a.m.  EXAM: CT ANGIOGRAPHY HEAD AND NECK  TECHNIQUE: Multidetector CT imaging of the head and neck was performed using the standard protocol during bolus administration of intravenous contrast. Multiplanar CT image reconstructions and MIPs were obtained to evaluate the vascular anatomy. Carotid stenosis measurements (when applicable) are obtained utilizing NASCET criteria, using the distal internal carotid diameter as the denominator.  CONTRAST:  71m OMNIPAQUE IOHEXOL 350 MG/ML SOLN, 558mOMNIPAQUE IOHEXOL 350 MG/ML SOLN  COMPARISON:  CT head without contrast 05/10/2014.  FINDINGS: CTA HEAD FINDINGS  The source images demonstrate no acute infarct. The basal ganglia are intact. The postcontrast images demonstrate no pathologic enhancement. The patient is intubated. An NG tube is in place.  Atherosclerotic changes are present within the distal internal carotid arteries bilaterally without significant stenosis. The A1 and M1 segments are normal. The anterior communicating artery is patent. The bifurcations are intact. There is moderate attenuation of distal MCA branch vessels bilaterally.  The distal left vertebral artery is narrowed P on the  dura. Right vertebral artery is intact. The left PICA origin is visualized and normal. The right AICA is dominant. The basilar artery is within normal limits. The left posterior cerebral artery originates from the basilar tip. The right posterior cerebral artery is of fetal type. There is moderate attenuation of PCA branch vessels bilaterally.  Review of the MIP images confirms the above findings.  CTA NECK FINDINGS  A 3 vessel arch configuration is present. Right lateral irregularity and focally ectasia is noted in the proximal innominate artery. The artery measures up to 21 mm in diameter. The proximal vessels are otherwise normal. Atherosclerotic calcifications are present in the aortic arch.  The vertebral artery is slightly dominant to the left. There is focal narrowing of the distal left vertebral artery at the level of C2-2.  The right common carotid artery demonstrates atherosclerotic changes at the bifurcation. There some calcific changes in the proximal right ICA without significant stenosis. The right ICA is tortuous without distal stenosis.  The left common carotid artery is within normal limits. Atherosclerotic changes are noted at the bifurcation and proximal left ICA without significant stenosis. The left ICA is tortuous proximally. The distal left ICA is normal.  The bone windows demonstrate multilevel disc disease with uncovertebral spurring from C3-4 through C6-7. No focal lytic or blastic lesions are present. The lung apices are clear.  Review of the MIP images confirms the above findings.  IMPRESSION: 1. Atherosclerotic changes at the carotid bifurcations bilaterally without significant stenosis. 2. ICA tortuosity is worse right than left without significant stenosis. 3. Focal narrowing of the distal left vertebral artery at the level of C2 and the dura with a small irregular vessel extending to the vertebrobasilar junction. 4. The basilar artery is intact. 5. Focal ectasia of the innominate  artery measuring up to 21 mm just above the aortic arch. 6. Atherosclerotic changes within the cavernous carotid arteries bilaterally without significant stenosis. 7. Moderate distal small vessel disease in the MCA and PCA branch vessels. These results were called by telephone at the time of interpretation on 05/10/2014 at 12:12 pm to Dr. MCRoland Rackwho verbally acknowledged these results.   Electronically Signed  By: Lawrence Santiago M.D.   On: 05/10/2014 12:44   Ct Head Wo Contrast  05/10/2014   CLINICAL DATA:  Patient unresponsive.  EXAM: CT HEAD WITHOUT CONTRAST  TECHNIQUE: Contiguous axial images were obtained from the base of the skull through the vertex without intravenous contrast.  COMPARISON:  None.  FINDINGS: There is no evidence of acute intracranial abnormality including infarct, hemorrhage, mass lesion, mass effect, midline shift or abnormal extra-axial fluid collection. Mucous retention cysts or polyps are seen in the maxillary sinuses bilaterally. Mastoid air cells and middle ears are clear. The calvarium is intact.  IMPRESSION: No acute intracranial abnormality.  Mucous retention cysts or polyps in the maxillary sinuses.   Electronically Signed   By: Inge Rise M.D.   On: 05/10/2014 09:04   Ct Soft Tissue Neck W Contrast  05/15/2014   CLINICAL DATA:  58 year old male with 2 months of hoarseness without pain. No bleeding or hemoptysis. Recent smoker. Recent intubation. Now extubated. Flexible laryngoscopy performed on 05/14/2014 demonstrated visible serration on lower NG all surface of left area epiglottic fold. Epiglottis with swelling and edema.  EXAM: CT NECK WITH CONTRAST  TECHNIQUE: Multidetector CT imaging of the neck was performed using the standard protocol following the bolus administration of intravenous contrast.  COMPARISON:  Prior CTA of the neck from 05/10/2014  FINDINGS: The visualized portions of the brain and posterior fossa are unremarkable. Orbits within normal  limits.  Mild polypoid opacity present within the maxillary sinuses bilaterally. Paranasal sinuses are otherwise clear. No mastoid effusion.  The salivary glands including the parotid glands and submandibular glands are within normal limits.  Oral cavity is normal in appearance without mass lesion or loculated fluid collection. The patient is edentulous. Palatine tonsils are symmetric bilaterally. Uvula is normal. Parapharyngeal fat is preserved. Nasopharynx and oropharynx within normal limits.  No retropharyngeal fluid collection.  There is mild diffuse epiglottic swelling and edema. Additionally, there is mild edema and swelling along the aryepiglottic folds bilaterally. There is mild irregularity along the left aryepiglottic fold (series 2, image 85), which may reflect the ulceration visualized on recent laryngoscopy. There is fullness of the left piriform sinus without definite submucosal mass lesion. The right piriform sinus is preserved. Additionally, asymmetric fullness in the region of the right pre epiglottic fat and right false vocal cord (series 2, image 81). Evaluation of this area somewhat difficult due to motion artifact. True vocal cords are fairly symmetric inferiorly. Thyroid and cricoid cartilages within normal limits. Subglottic airway is clear.  There is an enlarged right level II 8 node measuring 2 cm in short access (series 2, image 66). Central hypodensity within this note is concerning for necrosis. Adjacent mildly prominent right level II a node measures 8 mm in short axis. While this node measures within normal limits for size, central hypodensity also worrisome for necrosis. No left-sided cervical adenopathy. No supraclavicular lymph nodes.  Thyroid gland within normal limits.  Visualized portions of the lung apices are clear.  Scattered atheromatous plaque present about the carotid bifurcations and proximal internal carotid arteries, better evaluated on recent CTA.  Multilevel  degenerative changes noted within the visualized spine. No worrisome lytic or blastic osseous lesions. No acute osseous abnormality.  IMPRESSION: 1. Edema with swelling within the epiglottis and aryepiglottic folds bilaterally, which may be related to recent intubation. Airway remains widely patent. 2. Subtle asymmetric irregularity along the laryngeal surface of the left aryepiglottic fold, which may reflect the ulcerative lesion seen on flexible laryngoscopy performed  earlier on the same day. There is associated fullness within the adjacent left piriform sinus. 3. Apparent fullness and asymmetry within the right pre epiglottic fat extending inferiorly towards the right false vocal cord. Evaluation of this area is somewhat difficult due to motion artifact. Attention at possible future laryngoscopy/biopsy recommended. 4. Enlarged 2.0 cm necrotic right level IIa node, worrisome for nodal metastasis. An adjacent 8 mm node is also worrisome with suggestion of central necrosis, although is within normal limits for size criteria.   Electronically Signed   By: Jeannine Boga M.D.   On: 05/15/2014 07:30   Ct Angio Neck W/cm &/or Wo/cm  05/10/2014   CLINICAL DATA:  INITIAL ENCOUNTER. CODE STROKE. Patient found unresponsive at 6 o'clock a.m. Last seen well at 1 o'clock a.m.  EXAM: CT ANGIOGRAPHY HEAD AND NECK  TECHNIQUE: Multidetector CT imaging of the head and neck was performed using the standard protocol during bolus administration of intravenous contrast. Multiplanar CT image reconstructions and MIPs were obtained to evaluate the vascular anatomy. Carotid stenosis measurements (when applicable) are obtained utilizing NASCET criteria, using the distal internal carotid diameter as the denominator.  CONTRAST:  58m OMNIPAQUE IOHEXOL 350 MG/ML SOLN, 563mOMNIPAQUE IOHEXOL 350 MG/ML SOLN  COMPARISON:  CT head without contrast 05/10/2014.  FINDINGS: CTA HEAD FINDINGS  The source images demonstrate no acute infarct.  The basal ganglia are intact. The postcontrast images demonstrate no pathologic enhancement. The patient is intubated. An NG tube is in place.  Atherosclerotic changes are present within the distal internal carotid arteries bilaterally without significant stenosis. The A1 and M1 segments are normal. The anterior communicating artery is patent. The bifurcations are intact. There is moderate attenuation of distal MCA branch vessels bilaterally.  The distal left vertebral artery is narrowed P on the dura. Right vertebral artery is intact. The left PICA origin is visualized and normal. The right AICA is dominant. The basilar artery is within normal limits. The left posterior cerebral artery originates from the basilar tip. The right posterior cerebral artery is of fetal type. There is moderate attenuation of PCA branch vessels bilaterally.  Review of the MIP images confirms the above findings.  CTA NECK FINDINGS  A 3 vessel arch configuration is present. Right lateral irregularity and focally ectasia is noted in the proximal innominate artery. The artery measures up to 21 mm in diameter. The proximal vessels are otherwise normal. Atherosclerotic calcifications are present in the aortic arch.  The vertebral artery is slightly dominant to the left. There is focal narrowing of the distal left vertebral artery at the level of C2-2.  The right common carotid artery demonstrates atherosclerotic changes at the bifurcation. There some calcific changes in the proximal right ICA without significant stenosis. The right ICA is tortuous without distal stenosis.  The left common carotid artery is within normal limits. Atherosclerotic changes are noted at the bifurcation and proximal left ICA without significant stenosis. The left ICA is tortuous proximally. The distal left ICA is normal.  The bone windows demonstrate multilevel disc disease with uncovertebral spurring from C3-4 through C6-7. No focal lytic or blastic lesions are  present. The lung apices are clear.  Review of the MIP images confirms the above findings.  IMPRESSION: 1. Atherosclerotic changes at the carotid bifurcations bilaterally without significant stenosis. 2. ICA tortuosity is worse right than left without significant stenosis. 3. Focal narrowing of the distal left vertebral artery at the level of C2 and the dura with a small irregular vessel extending to the  vertebrobasilar junction. 4. The basilar artery is intact. 5. Focal ectasia of the innominate artery measuring up to 21 mm just above the aortic arch. 6. Atherosclerotic changes within the cavernous carotid arteries bilaterally without significant stenosis. 7. Moderate distal small vessel disease in the MCA and PCA branch vessels. These results were called by telephone at the time of interpretation on 05/10/2014 at 12:12 pm to Dr. Roland Rack, who verbally acknowledged these results.   Electronically Signed   By: Lawrence Santiago M.D.   On: 05/10/2014 12:44   Ct Angio Chest Pe W/cm &/or Wo Cm  05/10/2014   CLINICAL DATA:  Found on couch unresponsive. Shortness of breath for 3-4 days.  EXAM: CT ANGIOGRAPHY CHEST WITH CONTRAST  TECHNIQUE: Multidetector CT imaging of the chest was performed using the standard protocol during bolus administration of intravenous contrast. Multiplanar CT image reconstructions and MIPs were obtained to evaluate the vascular anatomy.  CONTRAST:  94m OMNIPAQUE IOHEXOL 350 MG/ML SOLN, 579mOMNIPAQUE IOHEXOL 350 MG/ML SOLN  COMPARISON:  None  FINDINGS: Mediastinum: The heart size appears normal. No pericardial effusion. The trachea appears patent. There is an endotracheal tube with tip above the carina. Nasogastric tube is identified within the esophagus. There is no mediastinal or hilar adenopathy identified. No supraclavicular or axillary adenopathy. There is calcified atherosclerotic disease involving the thoracic aorta. Calcified plaque is also noted within the LAD and RCA  coronary arteries. Normal appearance of the pulmonary artery. No lobar or segmental filling defects identified to suggest pulmonary embolus.  Lungs/Pleura: There is no pleural effusion identified. Dependent changes noted in the right lung base. No airspace consolidation. Left upper lobe lung nodule measures 8 mm.  Upper Abdomen: Incidental imaging through the upper abdomen is unremarkable.  Musculoskeletal: Review of the visualized bony structures is significant for thoracic kyphosis. There is multi level degenerative disc disease identified. No acute bone abnormality noted.  Review of the MIP images confirms the above findings.  IMPRESSION: 1. Exam is negative for acute pulmonary embolus. 2. Atherosclerotic disease including multi vessel coronary artery calcification. 3. Left lung nodule measures 8 mm If the patient is at high risk for bronchogenic carcinoma, follow-up chest CT at 3-28m65months recommended. If the patient is at low risk for bronchogenic carcinoma, follow-up chest CT at 6-12 months is recommended. This recommendation follows the consensus statement: Guidelines for Management of Small Pulmonary Nodules Detected on CT Scans: A Statement from the [employer] published in Radiology 2005; 237:395-400.   Electronically Signed   By: TayKerby MoorsD.   On: 05/10/2014 12:29   Mr Brain Wo Contrast  05/12/2014   CLINICAL DATA:  [age]ar old male last seen normal at 1 a.m. on 05/10/2014. Found unresponsive. On admission was unresponsive with bradycardia, and atrial fibrillation. No known past medical history. Improving altered mental status.  EXAM: MRI HEAD WITHOUT CONTRAST  TECHNIQUE: Multiplanar, multiecho pulse sequences of the brain and surrounding structures were obtained without intravenous contrast.  COMPARISON:  CT head 05/10/2014.  CT angio head and neck 05/10/2014.  FINDINGS: Motion degraded exam.  Overall study diagnostic.  Widespread areas of restricted diffusion affecting primarily  the BILATERAL globus pallidus, other deep nuclei, and periventricular white matter, predominantly supratentorial, but with involvement of the cerebellum, consistent with a hypoxic/ischemic and/or hypotensive insult. Carbon monoxide poisoning can also have this appearance, but given the punctate deep white matter areas of restricted diffusion, is less favored. No hemorrhage, mass lesion, hydrocephalus, or extra-axial fluid. Premature for age atrophy. Mild subcortical  and periventricular T2 and FLAIR hyperintensities, likely chronic microvascular ischemic change. 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 Flow voids are maintained in the carotid, basilar, and vertebral arteries. There is no obvious head trauma or osseous lesion. Upper cervical region unremarkable.  Compared with prior CT studies, the these changes are not visible.  IMPRESSION: Widespread areas of restricted diffusion involving both the deep nuclei (predominately globus pallidus) as well as the deep white matter in a watershed pattern. Findings are consistent with a hypoxic/ischemic and/or hypertensive insult. Carbon monoxide poisoning has some similar imaging features; see discussion above.   Electronically Signed   By: Rolla Flatten M.D.   On: 05/12/2014 10:46   Nm Myocar Multi W/spect W/wall Motion / Ef  05/15/2014   CLINICAL DATA:  58 year old male recently admitted with respiratory failure, aspiration pneumonia and septic shock. Additionally, had atrial fibrillation with rapid ventricular response and elevated troponins thought to be related O2 demand ischemia from the AFib with RVR. Nuclear medicine cardiac stress test warranted to evaluate for reversible ischemia.  EXAM: MYOCARDIAL IMAGING WITH SPECT (REST AND PHARMACOLOGIC-STRESS)  GATED LEFT VENTRICULAR WALL MOTION STUDY  LEFT VENTRICULAR EJECTION FRACTION  TECHNIQUE: Standard myocardial SPECT  imaging was performed after resting intravenous injection of 10 mCi [id number]. Subsequently, intravenous infusion of Lexiscan was performed under the supervision of the Cardiology staff. At peak effect of the drug, 30 mCi [id number] was injected intravenously and standard myocardial SPECT imaging was performed. Quantitative gated imaging was also performed to evaluate left ventricular wall motion, and estimate left ventricular ejection fraction.  COMPARISON:  Chest x-ray 05/11/2014  FINDINGS: Perfusion: No decreased activity in the left ventricle on stress imaging to suggest reversible ischemia or infarction. Mildly decreased radiotracer uptake in the inferior wall on both the resting and stress images likely represents diaphragmatic attenuation in a male patient.  Wall Motion: Normal left ventricular wall motion. No left ventricular dilation.  Left Ventricular Ejection Fraction: 61 %  End diastolic volume 76 ml  End systolic volume 29 ml  IMPRESSION: 1. No reversible ischemia or infarction.  2. Normal left ventricular wall motion.  3. Left ventricular ejection fraction 61%  4. Low-risk stress test findings*.  *2012 Appropriate Use Criteria for Coronary Revascularization Focused Update: J Am Coll Cardiol. 202585;27(7):824-235http://[email or web address]?[email or web address]   Electronically Signed   By: HeJacqulynn Cadet.D.   On: 05/15/2014 11:15   Dg Chest Port 1 View  05/11/2014   CLINICAL DATA:  Acute respiratory failure with difficult intubation, septic shock and aspiration pneumonia  EXAM: PORTABLE CHEST - 1 VIEW  COMPARISON:  05/10/2014  FINDINGS: The cardiac shadow is mildly enlarged but stable. An endotracheal tube is noted 2.6 cm above the carina. A nasogastric catheter and left jugular central line are again seen and stable. The lungs are well aerated with minimal bibasilar atelectatic changes. No focal confluent infiltrate is seen. No pneumothorax is noted. No bony abnormality  is seen.  IMPRESSION: Tubes and lines as described above.  Bibasilar atelectatic changes.   Electronically Signed   By: MaInez Catalina.D.   On: 05/11/2014 08:02   Dg Chest Port 1 View  05/10/2014   CLINICAL DATA:  [age]ear old male found unresponsive in home by roommate at 0630 hr, bradycardia on EMS arrival, intubated. Initial encounter.  EXAM: PORTABLE CHEST - 1 VIEW  COMPARISON:  None.  FINDINGS: Portable AP supine view at 0818 hr. Endotracheal tube in good  position at the level the clavicles. Enteric tube courses to the abdomen, side hole the level of the gastric body.  Mildly low lung volumes. Normal cardiac size and mediastinal contours. Visualized tracheal air column is within normal limits. No pneumothorax, pulmonary edema, pleural effusion or confluent pulmonary opacity.  IMPRESSION: 1. ET tube and enteric tube in good position. 2. Mildly low lung volumes.  No acute cardiopulmonary abnormality.   Electronically Signed   By: Lars Pinks M.D.   On: 05/10/2014 08:34   Dg Chest Port 1v Same Day  05/10/2014   CLINICAL DATA:  Central catheter placement ; hypoxia  EXAM: PORTABLE CHEST - 1 VIEW SAME DAY  COMPARISON:  Study obtained earlier in the day  FINDINGS: Central catheter tip is in the left innominate vein. Endotracheal tube tip is 4.1 cm above the carina. Nasogastric tube tip and side port are in the stomach. No pneumothorax. There is no edema or consolidation. Heart is upper normal in size with pulmonary vascularity within normal limits. Thoracolumbar levoscoliosis is stable.  IMPRESSION: New central catheter tip is in the left innominate vein. Tube positions as described. No pneumothorax. No lung edema or consolidation.   Electronically Signed   By: Lowella Grip M.D.   On: 05/10/2014 10:48      IMPRESSION/PLAN:  This is a delightful man with stage IVA supraglottic cancer pre-biopsy; PET is pending , biopsy pending,  + smoking history. The patient is likely a good candidate for radiotherapy for  organ preservation. The patient has been discussed in detail at tumor board. Plan is as below:   1) Needs biopsy: neck node bx pending  2) The patient will meet with med/onc to discuss chemotherapy - anticipate concurrent ChRT if a candidate for this.  Needs referral [facility] in [place].  3) PET pending. If this revealed obvious distant metastases, it could change our recommendations : Has 21m lung nodule, and could be 2nd primary vs met, if anything. [facility]  4) PEG tube likely to be ordered but ENT, per my discussion with Dr WErik Obey  5) [facility]Dr CBaruch Gouty is recommended on an outpatient basis, once stable for discharge, as he will need 7 weeks of treatment and would benefit from having this close to home. I will defer to the inpatient team to make this and any med[facility]onc [facility] [place]  __________________________________________   SEppie Gibson MD

## 2014-05-16 ENCOUNTER — Encounter: Payer: Self-pay | Admitting: Internal Medicine

## 2014-05-16 ENCOUNTER — Inpatient Hospital Stay (HOSPITAL_COMMUNITY): Payer: Medicaid Other

## 2014-05-16 ENCOUNTER — Encounter: Payer: Self-pay | Admitting: Radiation Oncology

## 2014-05-16 ENCOUNTER — Ambulatory Visit
Admit: 2014-05-16 | Discharge: 2014-05-16 | Disposition: A | Discharge status: Home / Self Care | Payer: Self-pay | Attending: Radiation Oncology | Admitting: Radiation Oncology

## 2014-05-16 ENCOUNTER — Encounter (HOSPITAL_COMMUNITY): Payer: Self-pay | Admitting: Physician Assistant

## 2014-05-16 DIAGNOSIS — I471 Supraventricular tachycardia: Secondary | ICD-10-CM

## 2014-05-16 DIAGNOSIS — C321 Malignant neoplasm of supraglottis: Secondary | ICD-10-CM

## 2014-05-16 DIAGNOSIS — R070 Pain in throat: Secondary | ICD-10-CM

## 2014-05-16 DIAGNOSIS — E46 Unspecified protein-calorie malnutrition: Secondary | ICD-10-CM

## 2014-05-16 DIAGNOSIS — R911 Solitary pulmonary nodule: Secondary | ICD-10-CM

## 2014-05-16 DIAGNOSIS — R1310 Dysphagia, unspecified: Secondary | ICD-10-CM

## 2014-05-16 DIAGNOSIS — J387 Other diseases of larynx: Secondary | ICD-10-CM

## 2014-05-16 DIAGNOSIS — Z720 Tobacco use: Secondary | ICD-10-CM

## 2014-05-16 LAB — CULTURE, BLOOD (ROUTINE X 2)
CULTURE: NO GROWTH
Culture: NO GROWTH

## 2014-05-16 LAB — BASIC METABOLIC PANEL
ANION GAP: 10 (ref 5–15)
BUN: 23 mg/dL (ref 6–23)
CALCIUM: 9.2 mg/dL (ref 8.4–10.5)
CHLORIDE: 95 meq/L — AB (ref 96–112)
CO2: 35 mEq/L — ABNORMAL HIGH (ref 19–32)
Creatinine, Ser: 0.63 mg/dL (ref 0.50–1.35)
GFR calc Af Amer: >90 mL/min (ref >90)
GFR calc non Af Amer: >90 mL/min (ref >90)
Glucose, Bld: 100 mg/dL — ABNORMAL HIGH (ref 70–99)
Potassium: 3.3 mEq/L — ABNORMAL LOW (ref 3.7–5.3)
SODIUM: 140 meq/L (ref 137–147)

## 2014-05-16 LAB — CBC
HCT: 45.3 % (ref 39.0–52.0)
Hemoglobin: 14.4 g/dL (ref 13.0–17.0)
MCH: 24.7 pg — ABNORMAL LOW (ref 26.0–34.0)
MCHC: 31.8 g/dL (ref 30.0–36.0)
MCV: 77.7 fL — ABNORMAL LOW (ref 78.0–100.0)
Platelets: 193 10*3/uL (ref 150–400)
RBC: 5.83 MIL/uL — ABNORMAL HIGH (ref 4.22–5.81)
RDW: 18.3 % — AB (ref 11.5–15.5)
WBC: 8.8 10*3/uL (ref 4.0–10.5)

## 2014-05-16 LAB — GLUCOSE, CAPILLARY
GLUCOSE-CAPILLARY: 95 mg/dL (ref 70–99)
GLUCOSE-CAPILLARY: 95 mg/dL (ref 70–99)
Glucose-Capillary: 102 mg/dL — ABNORMAL HIGH (ref 70–99)
Glucose-Capillary: 123 mg/dL — ABNORMAL HIGH (ref 70–99)
Glucose-Capillary: 68 mg/dL — ABNORMAL LOW (ref 70–99)
Glucose-Capillary: 82 mg/dL (ref 70–99)
Glucose-Capillary: 97 mg/dL (ref 70–99)

## 2014-05-16 LAB — PROTIME-INR
INR: 1.11 (ref 0.00–1.49)
PROTHROMBIN TIME: 14.3 s (ref 11.6–15.2)

## 2014-05-16 MED ORDER — HEPARIN SODIUM (PORCINE) 5000 UNIT/ML IJ SOLN: 5000.0000 [IU] | Freq: Three times a day (TID) | INTRAMUSCULAR | Status: DC

## 2014-05-16 MED ORDER — LIDOCAINE HCL (PF) 1 % IJ SOLN
INTRAMUSCULAR | Status: AC
  Filled 2014-05-16: qty 10

## 2014-05-16 MED ORDER — MIDAZOLAM HCL 2 MG/2ML IJ SOLN
INTRAMUSCULAR | Status: AC | PRN
  Administered 2014-05-16: 1 mg via INTRAVENOUS

## 2014-05-16 MED ORDER — DEXTROSE-NACL 5-0.45 % IV SOLN
INTRAVENOUS | Status: DC
Start: 2014-05-16 — End: 2014-05-16
  Administered 2014-05-16: 03:00:00 via INTRAVENOUS

## 2014-05-16 MED ORDER — FENTANYL CITRATE 0.05 MG/ML IJ SOLN
INTRAMUSCULAR | Status: AC | PRN
  Administered 2014-05-16: 25 ug via INTRAVENOUS

## 2014-05-16 MED ORDER — HEPARIN SODIUM (PORCINE) 5000 UNIT/ML IJ SOLN
5000.0000 [IU] | Freq: Three times a day (TID) | INTRAMUSCULAR | Status: DC
  Administered 2014-05-18 – 2014-05-19 (×4): 5000 [IU] via SUBCUTANEOUS
  Filled 2014-05-16 (×8): qty 1

## 2014-05-16 MED ORDER — FENTANYL CITRATE 0.05 MG/ML IJ SOLN
INTRAMUSCULAR | Status: AC
  Filled 2014-05-16: qty 2

## 2014-05-16 MED ORDER — MIDAZOLAM HCL 2 MG/2ML IJ SOLN
INTRAMUSCULAR | Status: AC
  Filled 2014-05-16: qty 2

## 2014-05-16 MED ORDER — POTASSIUM CHLORIDE 2 MEQ/ML IV SOLN
INTRAVENOUS | Status: DC
  Administered 2014-05-16 – 2014-05-18 (×3): via INTRAVENOUS
  Filled 2014-05-16 (×6): qty 1000

## 2014-05-16 NOTE — Progress Notes (Signed)
PT Cancellation Note  Patient Details Name: Donald Hughes MRN: [id number] DOB: 11-Nov-1955   Cancelled Treatment:    Reason Eval/Treat Not Completed: Patient at procedure or test/unavailable Pt undergoing cervical node biopsy this AM.  Lorriane Shire 05/16/2014, 9:03 AM

## 2014-05-16 NOTE — Progress Notes (Signed)
Hypoglycemic Event  CBG: 68  Treatment: D50 IV 25 mL  Symptoms: Pale  Follow-up CBG: Time:0051 CBG Result:123  Possible Reasons for Event: Inadequate meal intake  Comments/MD notified:NP, Schorr    Donald Hughes  Remember to initiate Hypoglycemia Order Set & complete

## 2014-05-16 NOTE — Consult Note (Signed)
Reason for Consult:  Consideration for loop recorder.  Referring Physician:  Croitoru  JAVARRI SEGAL is an 58 y.o. male.  HPI:   58 yo male with history of tobacco abuse found unresponsive with bradycardia and poor resp 10/01, ETT x 2 days, LUL nodule on CT, failed swallow eval,  demand ischemia with troponin peak of 7.08, EF preserved by echo.  Lexiscan MV 05/15/14 which was negative for ischemia.  His initial presentation showed Afib with diffuse ST depression.  He converted spontaneously.   Review of telemetry 10/05 revealed episodes of bradycardia during the day into the upper 30's and sinus tach into the 140's. The patient reports he was sleeping some intermittently during the day. May be related to apnea. Intubation was noted to be difficult due to laryngeal edema. Sounds like this could be contributing to apneic events. Should  have CPAP during sleep and sleep study. Modified barium swallow:  Moderate pharyngeal phase dysphagia; Severe pharyngeal phase dysphagia.   These episodes appear to be quite frequent.   He underwent US guided core biopsy of right cervical lymph node today.   The patient reports feeling better today.  No CP, sob, orthopena, LEE, dizziness    He underwent FNA biopsy for probably laryngeal cancer.   Past Medical History  Diagnosis Date  . Difficult intubation 05/10/14    laryngeal edema, difficult DL and video laryngoscopy    History reviewed. No pertinent past surgical history.  History reviewed. No pertinent family history.  Social History:  reports that he has quit smoking. He does not have any smokeless tobacco history on file. He reports that he uses illicit drugs (Marijuana). His alcohol history is not on file.  Allergies: No Known Allergies  Medications:  Scheduled Meds: . antiseptic oral rinse  7 mL Mouth Rinse QID  . aspirin  150 mg Rectal Daily  . chlorhexidine  15 mL Mouth Rinse BID  . fentaNYL      . folic acid  1 mg Intravenous Daily  .  heparin subcutaneous  5,000 Units Subcutaneous 3 times per day  . Influenza vac split quadrivalent PF  0.5 mL Intramuscular Tomorrow-1000  . insulin aspart  0-15 Units Subcutaneous 6 times per day  . levETIRAcetam  500 mg Intravenous Q12H  . lidocaine (PF)      . lidocaine  15 mL Mouth/Throat Once  . lisinopril  10 mg Oral Daily  . midazolam      . thiamine IV  100 mg Intravenous Daily   Continuous Infusions: . dextrose 5 % and 0.45% NaCl 75 mL/hr at 05/16/14 0241   PRN Meds:.hydrALAZINE, ipratropium-albuterol, labetalol   Results for orders placed during the hospital encounter of 05/10/14 (from the past 48 hour(s))  GLUCOSE, CAPILLARY     Status: None   Collection Time    05/14/14 11:51 AM      Result Value Ref Range   Glucose-Capillary 86  70 - 99 mg/dL  GLUCOSE, CAPILLARY     Status: None   Collection Time    05/14/14  4:48 PM      Result Value Ref Range   Glucose-Capillary 77  70 - 99 mg/dL  GLUCOSE, CAPILLARY     Status: None   Collection Time    05/14/14  7:53 PM      Result Value Ref Range   Glucose-Capillary 85  70 - 99 mg/dL   Comment 1 Notify RN     Comment 2 Documented in Chart  GLUCOSE, CAPILLARY     Status: None   Collection Time    05/15/14 12:30 AM      Result Value Ref Range   Glucose-Capillary 92  70 - 99 mg/dL   Comment 1 Notify RN     Comment 2 Documented in Chart    GLUCOSE, CAPILLARY     Status: None   Collection Time    05/15/14  4:38 AM      Result Value Ref Range   Glucose-Capillary 82  70 - 99 mg/dL   Comment 1 Notify RN     Comment 2 Documented in Chart    GLUCOSE, CAPILLARY     Status: None   Collection Time    05/15/14 12:09 PM      Result Value Ref Range   Glucose-Capillary 77  70 - 99 mg/dL  GLUCOSE, CAPILLARY     Status: None   Collection Time    05/15/14  4:55 PM      Result Value Ref Range   Glucose-Capillary 70  70 - 99 mg/dL  GLUCOSE, CAPILLARY     Status: Abnormal   Collection Time    05/15/14  7:58 PM      Result  Value Ref Range   Glucose-Capillary 63 (*) 70 - 99 mg/dL   Comment 1 Documented in Chart     Comment 2 Notify RN    GLUCOSE, CAPILLARY     Status: Abnormal   Collection Time    05/15/14  8:28 PM      Result Value Ref Range   Glucose-Capillary 148 (*) 70 - 99 mg/dL  GLUCOSE, CAPILLARY     Status: Abnormal   Collection Time    05/16/14 12:35 AM      Result Value Ref Range   Glucose-Capillary 68 (*) 70 - 99 mg/dL   Comment 1 Documented in Chart     Comment 2 Notify RN    GLUCOSE, CAPILLARY     Status: Abnormal   Collection Time    05/16/14  1:03 AM      Result Value Ref Range   Glucose-Capillary 123 (*) 70 - 99 mg/dL   Comment 1 Documented in Chart     Comment 2 Notify RN    GLUCOSE, CAPILLARY     Status: None   Collection Time    05/16/14  4:07 AM      Result Value Ref Range   Glucose-Capillary 95  70 - 99 mg/dL   Comment 1 Documented in Chart     Comment 2 Notify RN    BASIC METABOLIC PANEL     Status: Abnormal   Collection Time    05/16/14  5:47 AM      Result Value Ref Range   Sodium 140  137 - 147 mEq/L   Potassium 3.3 (*) 3.7 - 5.3 mEq/L   Chloride 95 (*) 96 - 112 mEq/L   CO2 35 (*) 19 - 32 mEq/L   Glucose, Bld 100 (*) 70 - 99 mg/dL   BUN 23  6 - 23 mg/dL   Creatinine, Ser 0.63  0.50 - 1.35 mg/dL   Calcium 9.2  8.4 - 10.5 mg/dL   GFR calc non Af Amer >90  >90 mL/min   GFR calc Af Amer >90  >90 mL/min   Comment: (NOTE)     The eGFR has been calculated using the CKD EPI equation.     This calculation has not been validated in all clinical situations.  eGFR's persistently <90 mL/min signify possible Chronic Kidney     Disease.   Anion gap 10  5 - 15  CBC     Status: Abnormal   Collection Time    05/16/14  5:47 AM      Result Value Ref Range   WBC 8.8  4.0 - 10.5 K/uL   RBC 5.83 (*) 4.22 - 5.81 MIL/uL   Hemoglobin 14.4  13.0 - 17.0 g/dL   HCT 45.3  39.0 - 52.0 %   MCV 77.7 (*) 78.0 - 100.0 fL   MCH 24.7 (*) 26.0 - 34.0 pg   MCHC 31.8  30.0 - 36.0 g/dL    RDW 18.3 (*) 11.5 - 15.5 %   Platelets 193  150 - 400 K/uL  PROTIME-INR     Status: None   Collection Time    05/16/14  5:47 AM      Result Value Ref Range   Prothrombin Time 14.3  11.6 - 15.2 seconds   INR 1.11  0.00 - 1.49  GLUCOSE, CAPILLARY     Status: Abnormal   Collection Time    05/16/14  7:41 AM      Result Value Ref Range   Glucose-Capillary 102 (*) 70 - 99 mg/dL    Ct Soft Tissue Neck W Contrast  05/15/2014   CLINICAL DATA:  58 year old male with 2 months of hoarseness without pain. No bleeding or hemoptysis. Recent smoker. Recent intubation. Now extubated. Flexible laryngoscopy performed on 05/14/2014 demonstrated visible serration on lower NG all surface of left area epiglottic fold. Epiglottis with swelling and edema.  EXAM: CT NECK WITH CONTRAST  TECHNIQUE: Multidetector CT imaging of the neck was performed using the standard protocol following the bolus administration of intravenous contrast.  COMPARISON:  Prior CTA of the neck from 05/10/2014  FINDINGS: The visualized portions of the brain and posterior fossa are unremarkable. Orbits within normal limits.  Mild polypoid opacity present within the maxillary sinuses bilaterally. Paranasal sinuses are otherwise clear. No mastoid effusion.  The salivary glands including the parotid glands and submandibular glands are within normal limits.  Oral cavity is normal in appearance without mass lesion or loculated fluid collection. The patient is edentulous. Palatine tonsils are symmetric bilaterally. Uvula is normal. Parapharyngeal fat is preserved. Nasopharynx and oropharynx within normal limits.  No retropharyngeal fluid collection.  There is mild diffuse epiglottic swelling and edema. Additionally, there is mild edema and swelling along the aryepiglottic folds bilaterally. There is mild irregularity along the left aryepiglottic fold (series 2, image 85), which may reflect the ulceration visualized on recent laryngoscopy. There is fullness of  the left piriform sinus without definite submucosal mass lesion. The right piriform sinus is preserved. Additionally, asymmetric fullness in the region of the right pre epiglottic fat and right false vocal cord (series 2, image 81). Evaluation of this area somewhat difficult due to motion artifact. True vocal cords are fairly symmetric inferiorly. Thyroid and cricoid cartilages within normal limits. Subglottic airway is clear.  There is an enlarged right level II 8 node measuring 2 cm in short access (series 2, image 66). Central hypodensity within this note is concerning for necrosis. Adjacent mildly prominent right level II a node measures 8 mm in short axis. While this node measures within normal limits for size, central hypodensity also worrisome for necrosis. No left-sided cervical adenopathy. No supraclavicular lymph nodes.  Thyroid gland within normal limits.  Visualized portions of the lung apices are clear.  Scattered atheromatous plaque present  about the carotid bifurcations and proximal internal carotid arteries, better evaluated on recent CTA.  Multilevel degenerative changes noted within the visualized spine. No worrisome lytic or blastic osseous lesions. No acute osseous abnormality.  IMPRESSION: 1. Edema with swelling within the epiglottis and aryepiglottic folds bilaterally, which may be related to recent intubation. Airway remains widely patent. 2. Subtle asymmetric irregularity along the laryngeal surface of the left aryepiglottic fold, which may reflect the ulcerative lesion seen on flexible laryngoscopy performed earlier on the same day. There is associated fullness within the adjacent left piriform sinus. 3. Apparent fullness and asymmetry within the right pre epiglottic fat extending inferiorly towards the right false vocal cord. Evaluation of this area is somewhat difficult due to motion artifact. Attention at possible future laryngoscopy/biopsy recommended. 4. Enlarged 2.0 cm necrotic right  level IIa node, worrisome for nodal metastasis. An adjacent 8 mm node is also worrisome with suggestion of central necrosis, although is within normal limits for size criteria.   Electronically Signed   By: Jeannine Boga M.D.   On: 05/15/2014 07:30   Nm Myocar Multi W/spect W/wall Motion / Ef  05/15/2014   CLINICAL DATA:  58 year old male recently admitted with respiratory failure, aspiration pneumonia and septic shock. Additionally, had atrial fibrillation with rapid ventricular response and elevated troponins thought to be related O2 demand ischemia from the AFib with RVR. Nuclear medicine cardiac stress test warranted to evaluate for reversible ischemia.  EXAM: MYOCARDIAL IMAGING WITH SPECT (REST AND PHARMACOLOGIC-STRESS)  GATED LEFT VENTRICULAR WALL MOTION STUDY  LEFT VENTRICULAR EJECTION FRACTION  TECHNIQUE: Standard myocardial SPECT imaging was performed after resting intravenous injection of 10 mCi [id number]. Subsequently, intravenous infusion of Lexiscan was performed under the supervision of the Cardiology staff. At peak effect of the drug, 30 mCi [id number] was injected intravenously and standard myocardial SPECT imaging was performed. Quantitative gated imaging was also performed to evaluate left ventricular wall motion, and estimate left ventricular ejection fraction.  COMPARISON:  Chest x-ray 05/11/2014  FINDINGS: Perfusion: No decreased activity in the left ventricle on stress imaging to suggest reversible ischemia or infarction. Mildly decreased radiotracer uptake in the inferior wall on both the resting and stress images likely represents diaphragmatic attenuation in a male patient.  Wall Motion: Normal left ventricular wall motion. No left ventricular dilation.  Left Ventricular Ejection Fraction: 61 %  End diastolic volume 76 ml  End systolic volume 29 ml  IMPRESSION: 1. No reversible ischemia or infarction.  2. Normal left ventricular wall motion.  3. Left ventricular  ejection fraction 61%  4. Low-risk stress test findings*.  *2012 Appropriate Use Criteria for Coronary Revascularization Focused Update: J Am Coll Cardiol. 207510;25(8):527-782http://[email or web address]?[email or web address]   Electronically Signed   By: HeJacqulynn Cadet.D.   On: 05/15/2014 11:15    Review of Systems  Constitutional: Negative for fever.  HENT: Negative for congestion.   Respiratory: Negative for cough and shortness of breath.   Cardiovascular: Negative for chest pain, orthopnea, leg swelling and PND.  Gastrointestinal: Negative for nausea, vomiting, abdominal pain, blood in stool and melena.  Musculoskeletal: Negative for myalgias.  Neurological: Positive for weakness. Negative for dizziness.  All other systems reviewed and are negative.  Blood pressure 157/88, pulse 75, temperature 98.2 F (36.8 C), temperature source Oral, resp. rate 17, height 5' 5"  (1.651 m), weight 172 lb 4.8 oz (78.155 kg), SpO2 97.00%. Physical Exam  Nursing note and vitals reviewed. Constitutional: He is oriented to person, place, and time. He appears well-developed  and well-nourished. No distress.  HENT:  Head: Normocephalic and atraumatic.  Eyes: EOM are normal. Pupils are equal, round, and reactive to light. No scleral icterus.  Neck: Normal range of motion. Neck supple.  Cardiovascular: Normal rate, regular rhythm, S1 normal and S2 normal.   No murmur heard. Pulses:      Radial pulses are 2+ on the right side, and 2+ on the left side.       Dorsalis pedis pulses are 2+ on the right side, and 2+ on the left side.  Respiratory: Effort normal and breath sounds normal. He has no wheezes. He has no rales.  GI: Soft. Bowel sounds are normal. He exhibits no distension. There is no tenderness.  Musculoskeletal: He exhibits no edema.  Neurological: He is alert and oriented to person, place, and time.  General weakness  Skin: Skin is warm and dry.  Psychiatric: He has a normal mood  and affect.    Assessment/Plan: Active Problems:   Respiratory failure, acute   Altered mental status   Septic shock   Aspiration pneumonia   HAGER, BRYAN 05/16/2014, 10:51 AM     AtrialTachycardia  With frequent brief recurrences  There have been some post tachy bradycardia noted on ECG from [place] but only so low as 40s   The patient has no symptoms that he clearly associates wth his tachycardia  Of note on arrival of EMS, his HR was 94 with BP 70/40  He was nonresponsive  This makes a primary arrhtyhmia explanation unlikely.  No aFib so no indication for anticoagulation  The ECG which read Afib from [facility] was reviewed and was in error        Elevated troponin  Demand ischemia. Troponin peaked at 7.  Negative stress test yesterday.  EF 65-70% with G1DD.       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  Modified barium swallow:  Moderate pharyngeal phase dysphagia; Severe pharyngeal phase dysphagia.  PEG tube planned at some point.        Lung nodule  OP pet scan planned.   HTN  On lisinopril 58m daily.

## 2014-05-16 NOTE — Progress Notes (Signed)
NUTRITION FOLLOW-UP  INTERVENTION: Per MD note, pt will likely need PEG tube placement.  Once PEG tube is placed and stable for use: Recommend initiating Vital 1.5 formula via tube at 30 ml/hr and increase by 10 ml every 4 hours to goal rate of 60 ml/hr to provide 2160 kcals, 97 grams of protein, and 1094 ml of free water.   Will continue to monitor.  NUTRITION DIAGNOSIS: Inadequate oral intake related to inability to eat as evidenced by NPO status; ongoing  Goal: Pt to meet >/= 90% of their estimated nutrition needs; not met  Monitor:  NPO status,PEG placement, TF initiation once appropriate, labs, weight trends  58 y.o. male  Admitting Dx: Donald Hughes  ASSESSMENT: Pt with unknown medical hx found unresponsive by roommate. Pt intubated due to AMS and possible COPD with acute exacerbation. Work up ongoing.   10/2- Pt extubated.  10/6-Per SLP, diet recommendations are NPO.  10/7- Pt underwent cervical node biopsy this AM. Pt remains NPO. 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 Per MD note, pt likely will need PEG tube placement prior to discharge. RD with TF recommendations made if/when PEG tube is placed and stable for use. Will continue to monitor.  Height: Ht Readings from Last 1 Encounters:  05/13/14 5' 5"  (1.651 m)    Weight: Wt Readings from Last 1 Encounters:  05/16/14 172 lb 4.8 oz (78.155 kg)   BMI:  Body mass index is 28.67 kg/(m^2).  Re-Estimated Nutritional Needs: Kcal: 1950-2150 Protein: 93-103 grams Fluid: > 1.9 L/day  Skin: WDL  Diet Order: NPO    Intake/Output Summary (Last 24 hours) at 05/16/14 0958 Last data filed at 05/16/14 0936  Gross per 24 hour  Intake      0 ml  Output    500 ml  Net   -500 ml    Last BM: 10/5  Labs:   Recent Labs Lab 05/10/14 1309  05/11/14 0320 05/11/14 2128 05/12/14 0304 05/13/14 0215 05/16/14 0547  NA  --   < > 145  --  146 144 140  K 4.1  < > 3.6*  --  4.8 4.6 3.3*  CL  --   < > 107  --  109 103 95*   CO2  --   < > 25  --  32 34* 35*  BUN  --   < > 29*  --  22 21 23   CREATININE  --   < > 1.01  --  0.68 0.67 0.63  CALCIUM  --   < > 8.3*  --  8.6 9.0 9.2  MG 1.5  < > 1.5 2.7* 2.4  --   --   PHOS 2.1*  --  1.7*  --  3.3  --   --   GLUCOSE  --   < > 110*  --  125* 84 100*  < > = values in this interval not displayed.  CBG (last 3)   Recent Labs  05/16/14 0103 05/16/14 0407 05/16/14 0741  GLUCAP 123* 95 102*    Scheduled Meds: . antiseptic oral rinse  7 mL Mouth Rinse QID  . aspirin  150 mg Rectal Daily  . chlorhexidine  15 mL Mouth Rinse BID  . fentaNYL      . folic acid  1 mg Intravenous Daily  . heparin subcutaneous  5,000 Units Subcutaneous 3 times per day  . Influenza vac split quadrivalent PF  0.5 mL Intramuscular Tomorrow-1000  . insulin aspart  0-15 Units Subcutaneous 6 times  per day  . levETIRAcetam  500 mg Intravenous Q12H  . lidocaine (PF)      . lidocaine  15 mL Mouth/Throat Once  . lisinopril  10 mg Oral Daily  . midazolam      . thiamine IV  100 mg Intravenous Daily    Continuous Infusions: . dextrose 5 % and 0.45% NaCl 75 mL/hr at 05/16/14 0241    Past Medical History  Diagnosis Date  . Difficult intubation 05/10/14    laryngeal edema, difficult DL and video laryngoscopy    History reviewed. No pertinent past surgical history.  Donald Locks, MS, RD, Provisional LDN Pager # [phone] After hours/ weekend pager # [phone]

## 2014-05-16 NOTE — Procedures (Signed)
Procedure:  US guided core biopsy of right cervical lymph node Findings:  18 G core biopsy x 5.  Solid tissue submitted in saline to Path. No complications.

## 2014-05-16 NOTE — Progress Notes (Signed)
Speech Language Pathology Treatment: Dysphagia  Patient Details Name: Donald Hughes MRN: [id number] DOB: 1956/05/31 Today's Date: 05/16/2014 Time: 5681-2751 SLP Time Calculation (min): 38 min  Assessment / Plan / Recommendation Clinical Impression  Pt performed oral care via suction prior to engaging in ice chip trials. Delayed wet vocal quality with throat clearing noted, concerning for laryngeal penetration, although without coughing episodes observed yesterday. Pt today is aware of suspicion for malignancy, and verbalized potential treatment options as explained by the doctor. SLP initiated education and the importance of continuing with swallowing exercises. Pt performed exercises from previous date with Min cues from SLP; SLP then introduced a fourth exercise, which pt performed after demonstration. Will continue to follow and support patient as treatment plan is determined. Continue to recommend OP SLP services upon discharge home from hospital.   HPI HPI: 58 yo male adm to [facility] after being found unresponsive, he required intubation in the ED and intubation was difficult per notes.   Difficult extubation ? due to COPD per MD note.  Per MRI 10/3, pt with widespread restricted diffusion predominately in globus pallidus that may be consistent with hypoxia, ischemic event.  CXR negative for acute changes 05/12/14.     Pertinent Vitals Pain Assessment: No/denies pain  SLP Plan  Continue with current plan of care    Recommendations Diet recommendations: NPO Medication Administration: Via alternative means              Oral Care Recommendations: Oral care Q4 per protocol;Oral care prior to ice chips Follow up Recommendations: Outpatient SLP Plan: Continue with current plan of care    GO      [name], [name] CCC-SLP [phone]  [name] 05/16/2014, 3:23 PM

## 2014-05-16 NOTE — Consult Note (Signed)
[facility] CONSULT NOTE CHIEF COMPLAINTS/PURPOSE OF CONSULTATION:  Head and Neck Cancer: Laryngeal Cancer  I have seen the patient, examined him and edited the notes as follows  HISTORY OF PRESENTING ILLNESS:  Donald Hughes 58 y.o. male  admitted to [facility] on 10/1 with acute respiratory failure with cardiac arrest due to possible aspiration pneumonia and septic shock. Status was complicated with acute CVA due to severe hypertension. He required intubation at the time. After extubation, he failed a swallow evaluation which prompted further testing.  Patient had reported hoarseness 4-6 weeks prior to admission. He denies any hearing deficit, difficulties with chewing food, but he does have swallowing difficulties with solids. He continues to have residual cough from aspiration event. He reports a weight loss of 18 lbs in the last 3 weeks. Denies any bleeding issues such as epistaxis, hematemesis, hematuria or hematochezia. No worsening shortness of breath at this time.  He denies any other history of cancer or family history of malignancies. CT of the neck with contrast on 10/6 showed an enlarged 2.0 cm necrotic right level IIa node, worrisome for nodal metastasis with adjacent 8 mm node with suggestion of central necrosis. Laryngeal edema was noted as well as possible thyroid cartilage involvement. In addition, CT angio of the chest was done on 10/1, negative for pulmonary emboli but  showing a left lung nodule  that measures 8 mm, suspicious for malignancy.  On 10/6 he underwent ENT (Dr Erik Obey) evaluation, recommending treat without a direct laryngoscopy and biopsy of the endolarynx, to avoid precipitating an acute airway collapse with need for tracheostomy. No PET scan was able to be performed to date.  Interventional Radiology to perform a biopsy of enlarged right cervical node hopefully in hopes to obtain tissue diagnosis.   MEDICAL HISTORY:  Past Medical History   Diagnosis Date  . Difficult intubation 05/10/14    laryngeal edema, difficult DL and video laryngoscopy  Obstructive Sleep Apnea Tobacco abuse  SURGICAL HISTORY: History reviewed. No pertinent past surgical history.  SOCIAL HISTORY: History   Social History  . Marital Status: Legally Separated   Occupational History  . Works in [employer] at a [employer]  . Smoking status: Former Research scientist (life sciences)  . Smokeless tobacco: none     Comment: "I quit when I came in here."  . Alcohol Use: none  . Drug Use: Yes    Special: Marijuana  . Sexual Activity: Protected sex, monogamous relationship     FAMILY HISTORY: Mother alive with a history of COPD, father's medical history unknown. 5 siblings in good health, no family history of cancer.  ALLERGIES:  has No Known Allergies.  MEDICATIONS:  Scheduled Meds: . antiseptic oral rinse  7 mL Mouth Rinse QID  . aspirin  150 mg Rectal Daily  . chlorhexidine  15 mL Mouth Rinse BID  . folic acid  1 mg Intravenous Daily  . heparin subcutaneous  5,000 Units Subcutaneous 3 times per day  . Influenza vac split quadrivalent PF  0.5 mL Intramuscular Tomorrow-1000  . insulin aspart  0-15 Units Subcutaneous 6 times per day  . levETIRAcetam  500 mg Intravenous Q12H  . lidocaine  15 mL Mouth/Throat Once  . lisinopril  10 mg Oral Daily  . thiamine IV  100 mg Intravenous Daily   Continuous Infusions: . dextrose 5 % and 0.45% NaCl 75 mL/hr at 05/16/14 0241   PRN Meds:.hydrALAZINE, ipratropium-albuterol, labetalol  REVIEW OF SYSTEMS:  Constitutional: Denies fevers, chills or abnormal night sweats Eyes: Denies blurriness of vision, double vision or watery eyes Ears, nose, mouth, throat,and face: Denies mucositis, but does have dysphagia with solids, not with liquids. Voice hoarseness for the last 3 weeks.  Respiratory: Denies worsening dyspnea or wheezes. He does have chronic cough and increased secretions Cardiovascular:  Denies palpitation, chest discomfort or lower extremity swelling Gastrointestinal:  Denies nausea, heartburn or change in bowel habits. He reports a weight loss of 18 lbs in the last 3 weeks Skin: Denies abnormal skin rashes Lymphatics: Denies new lymphadenopathy or easy bruising Neurological: Denies numbness, tingling or new weaknesses Behavioral/Psych: Mood is stable, no new changes  All other systems were reviewed with the patient and are negative.  PHYSICAL EXAMINATION: ECOG PERFORMANCE STATUS: 2  Filed Vitals:   05/16/14 0654  BP: 123/89  Pulse: 79  Temp: 98.2 F (36.8 C)  Resp: 16   Filed Weights   05/14/14 0430 05/15/14 0451 05/16/14 0500  Weight: 178 lb 12.8 oz (81.103 kg) 174 lb (78.926 kg) 172 lb 4.8 oz (78.155 kg)    GENERAL:alert, no distress and comfortable SKIN: skin color, texture, turgor are normal, no rashes or significant lesions EYES: normal, conjunctiva are pink and non-injected, sclera clear OROPHARYNX:no exudate, no erythema and lips, buccal mucosa, and tongue normal  NECK: supple, thyroid normal size, non-tender, right cervical nodularity noted, about 3 by 2 cm, consistent with known laryngeal mass. LYMPH:  no palpable lymphadenopathy in the cervical, axillary or inguinal LUNGS: clear to auscultation and percussion with normal breathing effort HEART: regular rate & rhythm and no murmurs and no lower extremity edema ABDOMEN:abdomen soft, non-tender and normal bowel sounds Musculoskeletal:no cyanosis of digits and no clubbing  PSYCH: alert & oriented x 3 with fluent speech NEURO: no focal motor/sensory deficits  LABORATORY DATA:  I have reviewed the data as listed Lab Results  Component Value Date   WBC 8.8 05/16/2014   HGB 14.4 05/16/2014   HCT 45.3 05/16/2014   MCV 77.7* 05/16/2014   PLT 193 05/16/2014   Lab Results  Component Value Date   NA 140 05/16/2014   K 3.3* 05/16/2014   CL 95* 05/16/2014   CO2 35* 05/16/2014    RADIOGRAPHIC STUDIES: I have  reviewed all the imaging myself  Ct Angio Head W/cm &/or Wo Cm  05/10/2014    COMPARISON:  CT head without contrast 05/10/2014.  FINDINGS: CTA HEAD FINDINGS  The source images demonstrate no acute infarct. The basal ganglia are intact. The postcontrast images demonstrate no pathologic enhancement. The patient is intubated. An NG tube is in place.  Atherosclerotic changes are present within the distal internal carotid arteries bilaterally without significant stenosis. The A1 and M1 segments are normal. The anterior communicating artery is patent. The bifurcations are intact. There is moderate attenuation of distal MCA branch vessels bilaterally.  The distal left vertebral artery is narrowed P on the dura. Right vertebral artery is intact. The left PICA origin is visualized and normal. The right AICA is dominant. The basilar artery is within normal limits. The left posterior cerebral artery originates from the basilar tip. The right posterior cerebral artery is of fetal type. There is moderate attenuation of PCA branch vessels bilaterally.  Review of the MIP images confirms the above findings.  CTA NECK FINDINGS  A 3 vessel arch configuration is present. Right lateral irregularity and focally ectasia is noted in the proximal innominate artery. The artery measures up to 21 mm in diameter. The  proximal vessels are otherwise normal. Atherosclerotic calcifications are present in the aortic arch.  The vertebral artery is slightly dominant to the left. There is focal narrowing of the distal left vertebral artery at the level of C2-2.  The right common carotid artery demonstrates atherosclerotic changes at the bifurcation. There some calcific changes in the proximal right ICA without significant stenosis. The right ICA is tortuous without distal stenosis.  The left common carotid artery is within normal limits. Atherosclerotic changes are noted at the bifurcation and proximal left ICA without significant stenosis. The  left ICA is tortuous proximally. The distal left ICA is normal.  The bone windows demonstrate multilevel disc disease with uncovertebral spurring from C3-4 through C6-7. No focal lytic or blastic lesions are present. The lung apices are clear.  Review of the MIP images confirms the above findings.  IMPRESSION: 1. Atherosclerotic changes at the carotid bifurcations bilaterally without significant stenosis. 2. ICA tortuosity is worse right than left without significant stenosis. 3. Focal narrowing of the distal left vertebral artery at the level of C2 and the dura with a small irregular vessel extending to the vertebrobasilar junction. 4. The basilar artery is intact. 5. Focal ectasia of the innominate artery measuring up to 21 mm just above the aortic arch. 6. Atherosclerotic changes within the cavernous carotid arteries bilaterally without significant stenosis. 7. Moderate distal small vessel disease in the MCA and PCA branch vessels. These results were called by telephone at the time of interpretation on 05/10/2014 at 12:12 pm to Dr. Roland Rack, who verbally acknowledged these results.   Electronically Signed   By: Lawrence Santiago M.D.   On: 05/10/2014 12:44   Ct Head Wo Contrast  05/10/2014   .  COMPARISON:  None.  FINDINGS: There is no evidence of acute intracranial abnormality including infarct, hemorrhage, mass lesion, mass effect, midline shift or abnormal extra-axial fluid collection. Mucous retention cysts or polyps are seen in the maxillary sinuses bilaterally. Mastoid air cells and middle ears are clear. The calvarium is intact.  IMPRESSION: No acute intracranial abnormality.  Mucous retention cysts or polyps in the maxillary sinuses.   Electronically Signed   By: Inge Rise M.D.   On: 05/10/2014 09:04   Ct Soft Tissue Neck W Contrast COMPARISON:  Prior CTA of the neck from 05/10/2014  FINDINGS: The visualized portions of the brain and posterior fossa are unremarkable. Orbits within  normal limits.  Mild polypoid opacity present within the maxillary sinuses bilaterally. Paranasal sinuses are otherwise clear. No mastoid effusion.  The salivary glands including the parotid glands and submandibular glands are within normal limits.  Oral cavity is normal in appearance without mass lesion or loculated fluid collection. The patient is edentulous. Palatine tonsils are symmetric bilaterally. Uvula is normal. Parapharyngeal fat is preserved. Nasopharynx and oropharynx within normal limits.  No retropharyngeal fluid collection.  There is mild diffuse epiglottic swelling and edema. Additionally, there is mild edema and swelling along the aryepiglottic folds bilaterally. There is mild irregularity along the left aryepiglottic fold (series 2, image 85), which may reflect the ulceration visualized on recent laryngoscopy. There is fullness of the left piriform sinus without definite submucosal mass lesion. The right piriform sinus is preserved. Additionally, asymmetric fullness in the region of the right pre epiglottic fat and right false vocal cord (series 2, image 81). Evaluation of this area somewhat difficult due to motion artifact. True vocal cords are fairly symmetric inferiorly. Thyroid and cricoid cartilages within normal limits. Subglottic airway is clear.  There is an enlarged right level II 8 node measuring 2 cm in short access (series 2, image 66). Central hypodensity within this note is concerning for necrosis. Adjacent mildly prominent right level II a node measures 8 mm in short axis. While this node measures within normal limits for size, central hypodensity also worrisome for necrosis. No left-sided cervical adenopathy. No supraclavicular lymph nodes.  Thyroid gland within normal limits.  Visualized portions of the lung apices are clear.  Scattered atheromatous plaque present about the carotid bifurcations and proximal internal carotid arteries, better evaluated on recent CTA.  Multilevel  degenerative changes noted within the visualized spine. No worrisome lytic or blastic osseous lesions. No acute osseous abnormality.  IMPRESSION: 1. Edema with swelling within the epiglottis and aryepiglottic folds bilaterally, which may be related to recent intubation. Airway remains widely patent. 2. Subtle asymmetric irregularity along the laryngeal surface of the left aryepiglottic fold, which may reflect the ulcerative lesion seen on flexible laryngoscopy performed earlier on the same day. There is associated fullness within the adjacent left piriform sinus. 3. Apparent fullness and asymmetry within the right pre epiglottic fat extending inferiorly towards the right false vocal cord. Evaluation of this area is somewhat difficult due to motion artifact. Attention at possible future laryngoscopy/biopsy recommended. 4. Enlarged 2.0 cm necrotic right level IIa node, worrisome for nodal metastasis. An adjacent 8 mm node is also worrisome with suggestion of central necrosis, although is within normal limits for size criteria.   Electronically Signed   By: Jeannine Boga M.D.   On: 05/15/2014 07:30   Ct Angio Neck W/cm &/or Wo/cm  05/10/2014   COMPARISON:  CT head without contrast 05/10/2014.  FINDINGS: CTA HEAD FINDINGS  The source images demonstrate no acute infarct. The basal ganglia are intact. The postcontrast images demonstrate no pathologic enhancement. The patient is intubated. An NG tube is in place.  Atherosclerotic changes are present within the distal internal carotid arteries bilaterally without significant stenosis. The A1 and M1 segments are normal. The anterior communicating artery is patent. The bifurcations are intact. There is moderate attenuation of distal MCA branch vessels bilaterally.  The distal left vertebral artery is narrowed P on the dura. Right vertebral artery is intact. The left PICA origin is visualized and normal. The right AICA is dominant. The basilar artery is within normal  limits. The left posterior cerebral artery originates from the basilar tip. The right posterior cerebral artery is of fetal type. There is moderate attenuation of PCA branch vessels bilaterally.  Review of the MIP images confirms the above findings.  CTA NECK FINDINGS  A 3 vessel arch configuration is present. Right lateral irregularity and focally ectasia is noted in the proximal innominate artery. The artery measures up to 21 mm in diameter. The proximal vessels are otherwise normal. Atherosclerotic calcifications are present in the aortic arch.  The vertebral artery is slightly dominant to the left. There is focal narrowing of the distal left vertebral artery at the level of C2-2.  The right common carotid artery demonstrates atherosclerotic changes at the bifurcation. There some calcific changes in the proximal right ICA without significant stenosis. The right ICA is tortuous without distal stenosis.  The left common carotid artery is within normal limits. Atherosclerotic changes are noted at the bifurcation and proximal left ICA without significant stenosis. The left ICA is tortuous proximally. The distal left ICA is normal.  The bone windows demonstrate multilevel disc disease with uncovertebral spurring from C3-4 through C6-7. No focal lytic  or blastic lesions are present. The lung apices are clear.  Review of the MIP images confirms the above findings.  IMPRESSION: 1. Atherosclerotic changes at the carotid bifurcations bilaterally without significant stenosis. 2. ICA tortuosity is worse right than left without significant stenosis. 3. Focal narrowing of the distal left vertebral artery at the level of C2 and the dura with a small irregular vessel extending to the vertebrobasilar junction. 4. The basilar artery is intact. 5. Focal ectasia of the innominate artery measuring up to 21 mm just above the aortic arch. 6. Atherosclerotic changes within the cavernous carotid arteries bilaterally without significant  stenosis. 7. Moderate distal small vessel disease in the MCA and PCA branch vessels. These results were called by telephone at the time of interpretation on 05/10/2014 at 12:12 pm to Dr. Roland Rack, who verbally acknowledged these results.   Electronically Signed   By: Lawrence Santiago M.D.   On: 05/10/2014 12:44   Ct Angio Chest Pe W/cm &/or Wo Cm  05/10/2014   COMPARISON:  None  FINDINGS: Mediastinum: The heart size appears normal. No pericardial effusion. The trachea appears patent. There is an endotracheal tube with tip above the carina. Nasogastric tube is identified within the esophagus. There is no mediastinal or hilar adenopathy identified. No supraclavicular or axillary adenopathy. There is calcified atherosclerotic disease involving the thoracic aorta. Calcified plaque is also noted within the LAD and RCA coronary arteries. Normal appearance of the pulmonary artery. No lobar or segmental filling defects identified to suggest pulmonary embolus.  Lungs/Pleura: There is no pleural effusion identified. Dependent changes noted in the right lung base. No airspace consolidation. Left upper lobe lung nodule measures 8 mm.  Upper Abdomen: Incidental imaging through the upper abdomen is unremarkable.  Musculoskeletal: Review of the visualized bony structures is significant for thoracic kyphosis. There is multi level degenerative disc disease identified. No acute bone abnormality noted.  Review of the MIP images confirms the above findings.  IMPRESSION: 1. Exam is negative for acute pulmonary embolus. 2. Atherosclerotic disease including multi vessel coronary artery calcification. 3. Left lung nodule measures 8 mm If the patient is at high risk for bronchogenic carcinoma, follow-up chest CT at 3-70months is recommended. If the patient is at low risk for bronchogenic carcinoma, follow-up chest CT at 6-12 months is recommended. This recommendation follows the consensus statement: Guidelines for Management of  Small Pulmonary Nodules Detected on CT Scans: A Statement from the [employer] as published in Radiology 2005; 237:395-400.   Electronically Signed   By: Kerby Moors M.D.   On: 05/10/2014 12:29   Mr Brain Wo Contrast  05/12/2014    COMPARISON:  CT head 05/10/2014.  CT angio head and neck 05/10/2014.  FINDINGS: Motion degraded exam.  Overall study diagnostic.  Widespread areas of restricted diffusion affecting primarily the BILATERAL globus pallidus, other deep nuclei, and periventricular white matter, predominantly supratentorial, but with involvement of the cerebellum, consistent with a hypoxic/ischemic and/or hypotensive insult. Carbon monoxide poisoning can also have this appearance, but given the punctate deep white matter areas of restricted diffusion, is less favored. No hemorrhage, mass lesion, hydrocephalus, or extra-axial fluid. Premature for age atrophy. Mild subcortical and periventricular T2 and FLAIR hyperintensities, likely chronic microvascular ischemic change. 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 Flow voids are maintained in the carotid, basilar, and vertebral arteries. There is no obvious head trauma or osseous lesion. Upper cervical region unremarkable.  Compared  with prior CT studies, the these changes are not visible.  IMPRESSION: Widespread areas of restricted diffusion involving both the deep nuclei (predominately globus pallidus) as well as the deep white matter in a watershed pattern. Findings are consistent with a hypoxic/ischemic and/or hypertensive insult. Carbon monoxide poisoning has some similar imaging features; see discussion above.   Electronically Signed   By: Rolla Flatten M.D.   On: 05/12/2014 10:46   Assessment and Plan:  Newly diagnosed squamous cell carcinoma of the Head & Neck CT of the neck with contrast on 10/6 showed an enlarged 2.0 cm necrotic right level  IIa node, worrisome for nodal metastasis with adjacent 8 mm node with suggestion of central necrosis. laryngeal edema was noted as well as possible thyroid cartilage involvement. His case was reviewed at the ENT tumor board today. Interventional Radiology to perform a biopsy of enlarged right cervical node hopefully in hopes to obtain tissue diagnosis,without a direct laryngoscopy and biopsy of the endolarynx which may produce acute airway collapse with need for tracheostomy Stage of the disease is to be determined, a PET/CT scan will be ordered as outpatient.  Prognosis would depend on its results. Treatment options would include chemotherapy only, radiation only or chemotherapy in combination with radiation therapy. Patient wants to be treated closer to him in [place]. I will arrange referral. I recommend port and feeding tube placement prior to dismissal. He will need prophylactic tracheostomy placement.  Small Lung Nodule CT angio of the chest was done on 10/1, showing a  left lung nodule that measures 8 mm  suspicious for malignancy.  PET/CT scan will be ordered as outpatient to follow up on this abnormality Tobacco cessation is recommended  Malnutrition patient has lost 18 lbs in 3 weeks due to dysphagia Continue speech therapy I recommend placement of feeding tube in anticipation for future complications with mucositis with treatment.  Mouth/throat pain. patient will need pain management upon discharge.  His pain appears to be  controlled with current pain regimen.  Acute Respiratory failure and septic shock requiring intubation Complicated with Demand Ischemia, Atrial Fibrillation Acute CVA, Possible seizures per chart report. He developed complications in the setting of laryngeal cancer He is now extubated, symptoms improving Appreciate the care provided by all specialties, admitting team  DVT prophylaxis On subcutaneous Heparin per Pharmacy  Full Code  I will sign off and  refer him to the cancer center near [place].    Rondel Jumbo, PA-C @T @ 8:18 AM Alvy Bimler, Jasmina Gendron, MD 05/16/2014

## 2014-05-16 NOTE — Progress Notes (Signed)
Chart and note reviewed. Agree with note.  Kimberly Harris, RD, LDN, CNSC Pager [phone] After Hours Pager [phone]   

## 2014-05-16 NOTE — Sedation Documentation (Signed)
Procedure complete, patient tolerated well.

## 2014-05-16 NOTE — Progress Notes (Signed)
Biopsy site to neck remains free of any drainage/bleeding at this time. Will continue to monitor site.

## 2014-05-16 NOTE — Progress Notes (Addendum)
PATIENT DETAILS Name: Donald Hughes Age: 58 y.o. Sex: male Date of Birth: December 07, 1955 Admit Date: 05/10/2014 Admitting Physician Rush Farmer, MD PCP:No primary provider on file.  Brief summary Patient is a 58 year old male with a history of smoking, obesity, who developed hoarseness of voice a month prior to this admission, admitted on 10/1 as he was found unresponsive by his girlfriend. Patient was intubated, and admitted to the intensive care unit.During his hospital course, he has had episodes of bradycardia, A. Fib with RVR as well.Further workup has demonstrated a abnormal MRI and a possible seizure focus on his EEG. 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 Workup is in progress   Subjective: No major complaints.  Assessment/Plan: Active Problems:   Respiratory failure, acute -Secondary to failure to protect airway. Intubated on Admission, extubated and transferred to [facility]. Doing well.   Acute encephalopathy - Secondary to hypoxic/ischemic insult.  MRI brain shows widespread areas of restricted diffusion involving both deep nuclei, and deep white matter in a watershed pattern. Mental status has improved, appreciate neurology consultation.Per neurology, patient may have some long-term cognitive issues. He has developed a tremor, likely post ischemic.  Suspected seizures - Further workup since hospitalization- initial EEG per neurology showed irritability-started on Keppra, neurology recommended on the 7 days of Keppra-stop date 10/80.  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 Await further input from ENT and oncology.  Elevated troponins -secondary to demand ischemia -Seen by cardiology, nuclear stress test negative for ischemia.  Sinus  bradycardia and atrial fibrillation - Cardiology following, EP consultation pending to evaluate for Loop recorder implantation. -Reviewed cardiology notes-with newly diagnosed malignancy-not a candidate for anticoagulation. Currently on aspirin.  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 Encephalopathy - Speech therapy following, modified barium swallow done on 05/14/14-continue n.p.o. status. Per reviewed ENT note, likely will need PEG tube placement prior to discharge.    AKI, anion gap acidosis - resolved.  Disposition: Remain inpatient  DVT Prophylaxis: Prophylactic Heparin   Code Status: Full code   Family Communication None at bedside  Procedures:  None  CONSULTS:  pulmonary/intensive care, hematology/oncology and IR,Neuro and ENT  Time spent 40 minutes-which includes 50% of the time with face-to-face with patient/ family and coordinating care related to the above assessment and plan.    MEDICATIONS: Scheduled Meds: . antiseptic oral rinse  7 mL Mouth Rinse QID  . aspirin  150 mg Rectal Daily  . chlorhexidine  15 mL Mouth Rinse BID  . fentaNYL      . folic acid  1 mg Intravenous Daily  . heparin subcutaneous  5,000 Units Subcutaneous 3 times per day  . Influenza vac split quadrivalent PF  0.5 mL Intramuscular Tomorrow-1000  . insulin aspart  0-15 Units Subcutaneous 6 times per day  . levETIRAcetam  500 mg Intravenous Q12H  . lidocaine (PF)      . lidocaine  15 mL Mouth/Throat Once  . lisinopril  10 mg Oral Daily  . midazolam      . thiamine IV  100 mg Intravenous Daily   Continuous Infusions: . dextrose 5 % and 0.45% NaCl 75 mL/hr at 05/16/14 0241   PRN Meds:.hydrALAZINE, ipratropium-albuterol, labetalol  Antibiotics: Anti-infectives   Start     Dose/Rate Route Frequency Ordered Stop   05/11/14 0000  vancomycin (VANCOCIN) IVPB 1000 mg/200 mL premix  Status:  Discontinued     1,000 mg 200 mL/hr over 60 Minutes Intravenous Every 12 hours 05/10/14 1050  05/12/14 1430   05/10/14 2000  piperacillin-tazobactam (ZOSYN) IVPB 3.375 g  Status:  Discontinued     3.375 g 12.5 mL/hr over 240 Minutes Intravenous Every 8 hours 05/10/14 1050 05/12/14 1430   05/10/14 0930  piperacillin-tazobactam (ZOSYN) IVPB 3.375 g  Status:  Discontinued     3.375 g 100 mL/hr over 30 Minutes Intravenous  Once 05/10/14 0917 05/12/14 1430   05/10/14 0915  vancomycin (VANCOCIN) 1,500 mg in sodium chloride 0.9 % 500 mL IVPB     1,500 mg 250 mL/hr over 120 Minutes Intravenous  Once 05/10/14 0911 05/10/14 1246   05/10/14 0915  piperacillin-tazobactam (ZOSYN) IVPB 2.25 g  Status:  Discontinued     2.25 g 100 mL/hr over 30 Minutes Intravenous  Once 05/10/14 0911 05/10/14 0916       PHYSICAL EXAM: Vital signs in last 24 hours: Filed Vitals:   05/16/14 1121 05/16/14 1140 05/16/14 1200 05/16/14 1238  BP: 139/81 147/84 132/91 147/100  Pulse: 75 67 74 70  Temp: 98.4 F (36.9 C) 98 F (36.7 C) 98.1 F (36.7 C) 98.4 F (36.9 C)  TempSrc: Oral Oral Oral   Resp: 18 18 18 18   Height:      Weight:      SpO2: 98% 97% 96% 96%    Weight change: -0.771 kg (-1 lb 11.2 oz) Filed Weights   05/14/14 0430 05/15/14 0451 05/16/14 0500  Weight: 81.103 kg (178 lb 12.8 oz) 78.926 kg (174 lb) 78.155 kg (172 lb 4.8 oz)   Body mass index is 28.67 kg/(m^2).   Gen Exam: Awake and alert with clear speech.   Neck: Supple, No JVD.   Chest: B/L Clear.  CVS: S1 S2 Regular, no murmurs.  Abdomen: soft, BS +, non tender, non distended.  Extremities: no edema, lower extremities warm to touch. Neurologic: Non Focal.   Skin: No Rash.   Wounds: N/A.    Intake/Output from previous day:  Intake/Output Summary (Last 24 hours) at 05/16/14 1248 Last data filed at 05/16/14 0936  Gross per 24 hour  Intake      0 ml  Output    500 ml  Net   -500 ml     LAB RESULTS: CBC  Recent Labs Lab 05/10/14 0809 05/10/14 1130 05/11/14 0320 05/12/14 0304 05/13/14 0215 05/16/14 0547  WBC  13.0* 14.9* 10.9* 11.4* 12.0* 8.8  HGB 14.3 11.2* 11.2* 11.1* 11.6* 14.4  HCT 48.4 38.1* 36.0* 37.4* 39.8 45.3  PLT 280 183 169 170 186 193  MCV 83.7 81.8 77.9* 82.0 81.9 77.7*  MCH 24.7* 24.0* 24.2* 24.3* 23.9* 24.7*  MCHC 29.5* 29.4* 31.1 29.7* 29.1* 31.8  RDW 18.1* 17.9* 18.0* 18.7* 18.5* 18.3*  LYMPHSABS 1.2 0.4*  --   --   --   --   MONOABS 0.6 1.3*  --   --   --   --   EOSABS 0.1 0.0  --   --   --   --   BASOSABS 0.0 0.0  --   --   --   --     Chemistries   Recent Labs Lab 05/10/14 1309 05/10/14 2159 05/11/14 0320 05/11/14 2128 05/12/14 0304 05/13/14 0215 05/16/14 0547  NA  --  147 145  --  146 144 140  K 4.1 3.6* 3.6*  --  4.8  4.6 3.3*  CL  --  108 107  --  109 103 95*  CO2  --  25 25  --  32 34* 35*  GLUCOSE  --  87 110*  --  125* 84 100*  BUN  --  28* 29*  --  22 21 23   CREATININE  --  1.07 1.01  --  0.68 0.67 0.63  CALCIUM  --  8.2* 8.3*  --  8.6 9.0 9.2  MG 1.5 1.5 1.5 2.7* 2.4  --   --     CBG:  Recent Labs Lab 05/16/14 0035 05/16/14 0103 05/16/14 0407 05/16/14 0741 05/16/14 1212  GLUCAP 68* 123* 95 102* 82    GFR Estimated Creatinine Clearance: 99.5 ml/min (by C-G formula based on Cr of 0.63).  Coagulation profile  Recent Labs Lab 05/10/14 1130 05/16/14 0547  INR 1.37 1.11    Cardiac Enzymes  Recent Labs Lab 05/10/14 1130 05/10/14 1615 05/10/14 2159 05/10/14 2230  CKMB  --   --  19.6*  --   TROPONINI 2.41* 7.08*  --  5.51*    No components found with this basename: POCBNP,  No results found for this basename: DDIMER,  in the last 72 hours No results found for this basename: HGBA1C,  in the last 72 hours No results found for this basename: CHOL, HDL, LDLCALC, TRIG, CHOLHDL, LDLDIRECT,  in the last 72 hours No results found for this basename: TSH, T4TOTAL, FREET3, T3FREE, THYROIDAB,  in the last 72 hours No results found for this basename: VITAMINB12, FOLATE, FERRITIN, TIBC, IRON, RETICCTPCT,  in the last 72 hours No results  found for this basename: LIPASE, AMYLASE,  in the last 72 hours  Urine Studies No results found for this basename: UACOL, UAPR, USPG, UPH, UTP, UGL, UKET, UBIL, UHGB, UNIT, UROB, ULEU, UEPI, UWBC, URBC, UBAC, CAST, CRYS, UCOM, BILUA,  in the last 72 hours  MICROBIOLOGY: Recent Results (from the past 240 hour(s))  URINE CULTURE     Status: None   Collection Time    05/10/14  8:17 AM      Result Value Ref Range Status   Specimen Description URINE, CATHETERIZED   Final   Special Requests NONE   Final   Culture  Setup Time     Final   Value: 05/10/2014 12:58     Performed at [facility]     Final   Value: NO GROWTH     Performed at [facility]   Culture     Final   Value: NO GROWTH     Performed at [facility]   Report Status 05/11/2014 FINAL   Final  CULTURE, BLOOD (ROUTINE X 2)     Status: None   Collection Time    05/10/14  8:21 AM      Result Value Ref Range Status   Specimen Description BLOOD LEFT WRIST   Final   Special Requests BOTTLES DRAWN AEROBIC ONLY 2MLS   Final   Culture  Setup Time     Final   Value: 05/10/2014 12:55     Performed at [facility]   Culture     Final   Value: NO GROWTH 5 DAYS     Note: Culture results may be compromised due to an inadequate volume of blood received in culture bottles.     Performed at [facility]   Report Status 05/16/2014 FINAL   Final  CULTURE, BLOOD (ROUTINE  X 2)     Status: None   Collection Time    05/10/14  8:35 AM      Result Value Ref Range Status   Specimen Description BLOOD RIGHT FOREARM   Final   Special Requests BOTTLES DRAWN AEROBIC ONLY 3MLS   Final   Culture  Setup Time     Final   Value: 05/10/2014 12:54     Performed at [facility]   Culture     Final   Value: NO GROWTH 5 DAYS     Performed at [facility]   Report Status 05/16/2014 FINAL   Final  CULTURE, RESPIRATORY (NON-EXPECTORATED)     Status: None   Collection Time     05/10/14 11:30 AM      Result Value Ref Range Status   Specimen Description TRACHEAL ASPIRATE   Final   Special Requests NONE   Final   Gram Stain     Final   Value: FEW WBC PRESENT,BOTH PMN AND MONONUCLEAR     RARE SQUAMOUS EPITHELIAL CELLS PRESENT     NO ORGANISMS SEEN     Performed at [facility]   Culture     Final   Value: MODERATE STREPTOCOCCUS,BETA HEMOLYTIC NOT GROUP A     Performed at [facility]   Report Status 05/13/2014 FINAL   Final  MRSA PCR SCREENING     Status: None   Collection Time    05/10/14 12:54 PM      Result Value Ref Range Status   MRSA by PCR NEGATIVE  NEGATIVE Final   Comment:            The GeneXpert MRSA Assay (FDA     approved for NASAL specimens     only), is one component of a     comprehensive MRSA colonization     surveillance program. It is not     intended to diagnose MRSA     infection nor to guide or     monitor treatment for     MRSA infections.    RADIOLOGY STUDIES/RESULTS: Ct Angio Head W/cm &/or Wo Cm  05/10/2014   CLINICAL DATA:  INITIAL ENCOUNTER. CODE STROKE. Patient found unresponsive at 6 o'clock a.m. Last seen well at 1 o'clock a.m.  EXAM: CT ANGIOGRAPHY HEAD AND NECK  TECHNIQUE: Multidetector CT imaging of the head and neck was performed using the standard protocol during bolus administration of intravenous contrast. Multiplanar CT image reconstructions and MIPs were obtained to evaluate the vascular anatomy. Carotid stenosis measurements (when applicable) are obtained utilizing NASCET criteria, using the distal internal carotid diameter as the denominator.  CONTRAST:  7mL OMNIPAQUE IOHEXOL 350 MG/ML SOLN, 75mL OMNIPAQUE IOHEXOL 350 MG/ML SOLN  COMPARISON:  CT head without contrast 05/10/2014.  FINDINGS: CTA HEAD FINDINGS  The source images demonstrate no acute infarct. The basal ganglia are intact. The postcontrast images demonstrate no pathologic enhancement. The patient is intubated. An NG tube is in place.   Atherosclerotic changes are present within the distal internal carotid arteries bilaterally without significant stenosis. The A1 and M1 segments are normal. The anterior communicating artery is patent. The bifurcations are intact. There is moderate attenuation of distal MCA branch vessels bilaterally.  The distal left vertebral artery is narrowed P on the dura. Right vertebral artery is intact. The left PICA origin is visualized and normal. The right AICA is dominant. The basilar artery is within normal limits. The left posterior cerebral artery originates from  the basilar tip. The right posterior cerebral artery is of fetal type. There is moderate attenuation of PCA branch vessels bilaterally.  Review of the MIP images confirms the above findings.  CTA NECK FINDINGS  A 3 vessel arch configuration is present. Right lateral irregularity and focally ectasia is noted in the proximal innominate artery. The artery measures up to 21 mm in diameter. The proximal vessels are otherwise normal. Atherosclerotic calcifications are present in the aortic arch.  The vertebral artery is slightly dominant to the left. There is focal narrowing of the distal left vertebral artery at the level of C2-2.  The right common carotid artery demonstrates atherosclerotic changes at the bifurcation. There some calcific changes in the proximal right ICA without significant stenosis. The right ICA is tortuous without distal stenosis.  The left common carotid artery is within normal limits. Atherosclerotic changes are noted at the bifurcation and proximal left ICA without significant stenosis. The left ICA is tortuous proximally. The distal left ICA is normal.  The bone windows demonstrate multilevel disc disease with uncovertebral spurring from C3-4 through C6-7. No focal lytic or blastic lesions are present. The lung apices are clear.  Review of the MIP images confirms the above findings.  IMPRESSION: 1. Atherosclerotic changes at the carotid  bifurcations bilaterally without significant stenosis. 2. ICA tortuosity is worse right than left without significant stenosis. 3. Focal narrowing of the distal left vertebral artery at the level of C2 and the dura with a small irregular vessel extending to the vertebrobasilar junction. 4. The basilar artery is intact. 5. Focal ectasia of the innominate artery measuring up to 21 mm just above the aortic arch. 6. Atherosclerotic changes within the cavernous carotid arteries bilaterally without significant stenosis. 7. Moderate distal small vessel disease in the MCA and PCA branch vessels. These results were called by telephone at the time of interpretation on 05/10/2014 at 12:12 pm to Dr. Roland Rack, who verbally acknowledged these results.   Electronically Signed   By: Lawrence Santiago M.D.   On: 05/10/2014 12:44   Ct Head Wo Contrast  05/10/2014   CLINICAL DATA:  Patient unresponsive.  EXAM: CT HEAD WITHOUT CONTRAST  TECHNIQUE: Contiguous axial images were obtained from the base of the skull through the vertex without intravenous contrast.  COMPARISON:  None.  FINDINGS: There is no evidence of acute intracranial abnormality including infarct, hemorrhage, mass lesion, mass effect, midline shift or abnormal extra-axial fluid collection. Mucous retention cysts or polyps are seen in the maxillary sinuses bilaterally. Mastoid air cells and middle ears are clear. The calvarium is intact.  IMPRESSION: No acute intracranial abnormality.  Mucous retention cysts or polyps in the maxillary sinuses.   Electronically Signed   By: Inge Rise M.D.   On: 05/10/2014 09:04   Ct Soft Tissue Neck W Contrast  05/15/2014   CLINICAL DATA:  58 year old male with 2 months of hoarseness without pain. No bleeding or hemoptysis. Recent smoker. Recent intubation. Now extubated. Flexible laryngoscopy performed on 05/14/2014 demonstrated visible serration on lower NG all surface of left area epiglottic fold. Epiglottis with  swelling and edema.  EXAM: CT NECK WITH CONTRAST  TECHNIQUE: Multidetector CT imaging of the neck was performed using the standard protocol following the bolus administration of intravenous contrast.  COMPARISON:  Prior CTA of the neck from 05/10/2014  FINDINGS: The visualized portions of the brain and posterior fossa are unremarkable. Orbits within normal limits.  Mild polypoid opacity present within the maxillary sinuses bilaterally. Paranasal sinuses are  otherwise clear. No mastoid effusion.  The salivary glands including the parotid glands and submandibular glands are within normal limits.  Oral cavity is normal in appearance without mass lesion or loculated fluid collection. The patient is edentulous. Palatine tonsils are symmetric bilaterally. Uvula is normal. Parapharyngeal fat is preserved. Nasopharynx and oropharynx within normal limits.  No retropharyngeal fluid collection.  There is mild diffuse epiglottic swelling and edema. Additionally, there is mild edema and swelling along the aryepiglottic folds bilaterally. There is mild irregularity along the left aryepiglottic fold (series 2, image 85), which may reflect the ulceration visualized on recent laryngoscopy. There is fullness of the left piriform sinus without definite submucosal mass lesion. The right piriform sinus is preserved. Additionally, asymmetric fullness in the region of the right pre epiglottic fat and right false vocal cord (series 2, image 81). Evaluation of this area somewhat difficult due to motion artifact. True vocal cords are fairly symmetric inferiorly. Thyroid and cricoid cartilages within normal limits. Subglottic airway is clear.  There is an enlarged right level II 8 node measuring 2 cm in short access (series 2, image 66). Central hypodensity within this note is concerning for necrosis. Adjacent mildly prominent right level II a node measures 8 mm in short axis. While this node measures within normal limits for size, central  hypodensity also worrisome for necrosis. No left-sided cervical adenopathy. No supraclavicular lymph nodes.  Thyroid gland within normal limits.  Visualized portions of the lung apices are clear.  Scattered atheromatous plaque present about the carotid bifurcations and proximal internal carotid arteries, better evaluated on recent CTA.  Multilevel degenerative changes noted within the visualized spine. No worrisome lytic or blastic osseous lesions. No acute osseous abnormality.  IMPRESSION: 1. Edema with swelling within the epiglottis and aryepiglottic folds bilaterally, which may be related to recent intubation. Airway remains widely patent. 2. Subtle asymmetric irregularity along the laryngeal surface of the left aryepiglottic fold, which may reflect the ulcerative lesion seen on flexible laryngoscopy performed earlier on the same day. There is associated fullness within the adjacent left piriform sinus. 3. Apparent fullness and asymmetry within the right pre epiglottic fat extending inferiorly towards the right false vocal cord. Evaluation of this area is somewhat difficult due to motion artifact. Attention at possible future laryngoscopy/biopsy recommended. 4. Enlarged 2.0 cm necrotic right level IIa node, worrisome for nodal metastasis. An adjacent 8 mm node is also worrisome with suggestion of central necrosis, although is within normal limits for size criteria.   Electronically Signed   By: Jeannine Boga M.D.   On: 05/15/2014 07:30   Ct Angio Neck W/cm &/or Wo/cm  05/10/2014   CLINICAL DATA:  INITIAL ENCOUNTER. CODE STROKE. Patient found unresponsive at 6 o'clock a.m. Last seen well at 1 o'clock a.m.  EXAM: CT ANGIOGRAPHY HEAD AND NECK  TECHNIQUE: Multidetector CT imaging of the head and neck was performed using the standard protocol during bolus administration of intravenous contrast. Multiplanar CT image reconstructions and MIPs were obtained to evaluate the vascular anatomy. Carotid stenosis  measurements (when applicable) are obtained utilizing NASCET criteria, using the distal internal carotid diameter as the denominator.  CONTRAST:  60mL OMNIPAQUE IOHEXOL 350 MG/ML SOLN, 83mL OMNIPAQUE IOHEXOL 350 MG/ML SOLN  COMPARISON:  CT head without contrast 05/10/2014.  FINDINGS: CTA HEAD FINDINGS  The source images demonstrate no acute infarct. The basal ganglia are intact. The postcontrast images demonstrate no pathologic enhancement. The patient is intubated. An NG tube is in place.  Atherosclerotic changes are present  within the distal internal carotid arteries bilaterally without significant stenosis. The A1 and M1 segments are normal. The anterior communicating artery is patent. The bifurcations are intact. There is moderate attenuation of distal MCA branch vessels bilaterally.  The distal left vertebral artery is narrowed P on the dura. Right vertebral artery is intact. The left PICA origin is visualized and normal. The right AICA is dominant. The basilar artery is within normal limits. The left posterior cerebral artery originates from the basilar tip. The right posterior cerebral artery is of fetal type. There is moderate attenuation of PCA branch vessels bilaterally.  Review of the MIP images confirms the above findings.  CTA NECK FINDINGS  A 3 vessel arch configuration is present. Right lateral irregularity and focally ectasia is noted in the proximal innominate artery. The artery measures up to 21 mm in diameter. The proximal vessels are otherwise normal. Atherosclerotic calcifications are present in the aortic arch.  The vertebral artery is slightly dominant to the left. There is focal narrowing of the distal left vertebral artery at the level of C2-2.  The right common carotid artery demonstrates atherosclerotic changes at the bifurcation. There some calcific changes in the proximal right ICA without significant stenosis. The right ICA is tortuous without distal stenosis.  The left common carotid  artery is within normal limits. Atherosclerotic changes are noted at the bifurcation and proximal left ICA without significant stenosis. The left ICA is tortuous proximally. The distal left ICA is normal.  The bone windows demonstrate multilevel disc disease with uncovertebral spurring from C3-4 through C6-7. No focal lytic or blastic lesions are present. The lung apices are clear.  Review of the MIP images confirms the above findings.  IMPRESSION: 1. Atherosclerotic changes at the carotid bifurcations bilaterally without significant stenosis. 2. ICA tortuosity is worse right than left without significant stenosis. 3. Focal narrowing of the distal left vertebral artery at the level of C2 and the dura with a small irregular vessel extending to the vertebrobasilar junction. 4. The basilar artery is intact. 5. Focal ectasia of the innominate artery measuring up to 21 mm just above the aortic arch. 6. Atherosclerotic changes within the cavernous carotid arteries bilaterally without significant stenosis. 7. Moderate distal small vessel disease in the MCA and PCA branch vessels. These results were called by telephone at the time of interpretation on 05/10/2014 at 12:12 pm to Dr. Roland Rack, who verbally acknowledged these results.   Electronically Signed   By: Lawrence Santiago M.D.   On: 05/10/2014 12:44   Ct Angio Chest Pe W/cm &/or Wo Cm  05/10/2014   CLINICAL DATA:  Found on couch unresponsive. Shortness of breath for 3-4 days.  EXAM: CT ANGIOGRAPHY CHEST WITH CONTRAST  TECHNIQUE: Multidetector CT imaging of the chest was performed using the standard protocol during bolus administration of intravenous contrast. Multiplanar CT image reconstructions and MIPs were obtained to evaluate the vascular anatomy.  CONTRAST:  22mL OMNIPAQUE IOHEXOL 350 MG/ML SOLN, 75mL OMNIPAQUE IOHEXOL 350 MG/ML SOLN  COMPARISON:  None  FINDINGS: Mediastinum: The heart size appears normal. No pericardial effusion. The trachea appears  patent. There is an endotracheal tube with tip above the carina. Nasogastric tube is identified within the esophagus. There is no mediastinal or hilar adenopathy identified. No supraclavicular or axillary adenopathy. There is calcified atherosclerotic disease involving the thoracic aorta. Calcified plaque is also noted within the LAD and RCA coronary arteries. Normal appearance of the pulmonary artery. No lobar or segmental filling defects identified to suggest  pulmonary embolus.  Lungs/Pleura: There is no pleural effusion identified. Dependent changes noted in the right lung base. No airspace consolidation. Left upper lobe lung nodule measures 8 mm.  Upper Abdomen: Incidental imaging through the upper abdomen is unremarkable.  Musculoskeletal: Review of the visualized bony structures is significant for thoracic kyphosis. There is multi level degenerative disc disease identified. No acute bone abnormality noted.  Review of the MIP images confirms the above findings.  IMPRESSION: 1. Exam is negative for acute pulmonary embolus. 2. Atherosclerotic disease including multi vessel coronary artery calcification. 3. Left lung nodule measures 8 mm If the patient is at high risk for bronchogenic carcinoma, follow-up chest CT at 3-59months is recommended. If the patient is at low risk for bronchogenic carcinoma, follow-up chest CT at 6-12 months is recommended. This recommendation follows the consensus statement: Guidelines for Management of Small Pulmonary Nodules Detected on CT Scans: A Statement from the [place] as published in Radiology 2005; 237:395-400.   Electronically Signed   By: Kerby Moors M.D.   On: 05/10/2014 12:29   Mr Brain Wo Contrast  05/12/2014   CLINICAL DATA:  58 year old male last seen normal at 1 a.m. on 05/10/2014. Found unresponsive. On admission was unresponsive with bradycardia, and atrial fibrillation. No known past medical history. Improving altered mental status.  EXAM: MRI HEAD  WITHOUT CONTRAST  TECHNIQUE: Multiplanar, multiecho pulse sequences of the brain and surrounding structures were obtained without intravenous contrast.  COMPARISON:  CT head 05/10/2014.  CT angio head and neck 05/10/2014.  FINDINGS: Motion degraded exam.  Overall study diagnostic.  Widespread areas of restricted diffusion affecting primarily the BILATERAL globus pallidus, other deep nuclei, and periventricular white matter, predominantly supratentorial, but with involvement of the cerebellum, consistent with a hypoxic/ischemic and/or hypotensive insult. Carbon monoxide poisoning can also have this appearance, but given the punctate deep white matter areas of restricted diffusion, is less favored. No hemorrhage, mass lesion, hydrocephalus, or extra-axial fluid. Premature for age atrophy. Mild subcortical and periventricular T2 and FLAIR hyperintensities, likely chronic microvascular ischemic change. 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 Flow voids are maintained in the carotid, basilar, and vertebral arteries. There is no obvious head trauma or osseous lesion. Upper cervical region unremarkable.  Compared with prior CT studies, the these changes are not visible.  IMPRESSION: Widespread areas of restricted diffusion involving both the deep nuclei (predominately globus pallidus) as well as the deep white matter in a watershed pattern. Findings are consistent with a hypoxic/ischemic and/or hypertensive insult. Carbon monoxide poisoning has some similar imaging features; see discussion above.   Electronically Signed   By: Rolla Flatten M.D.   On: 05/12/2014 10:46   Nm Myocar Multi W/spect W/wall Motion / Ef  05/15/2014   CLINICAL DATA:  58 year old male recently admitted with respiratory failure, aspiration pneumonia and septic shock. Additionally, had atrial fibrillation with rapid ventricular response and elevated  troponins thought to be related O2 demand ischemia from the AFib with RVR. Nuclear medicine cardiac stress test warranted to evaluate for reversible ischemia.  EXAM: MYOCARDIAL IMAGING WITH SPECT (REST AND PHARMACOLOGIC-STRESS)  GATED LEFT VENTRICULAR WALL MOTION STUDY  LEFT VENTRICULAR EJECTION FRACTION  TECHNIQUE: Standard myocardial SPECT imaging was performed after resting intravenous injection of 10 mCi Tc-62m sestamibi. Subsequently, intravenous infusion of Lexiscan was performed under the supervision of the Cardiology staff. At peak effect of the drug, 30 mCi Tc-29m sestamibi was injected intravenously and standard  myocardial SPECT imaging was performed. Quantitative gated imaging was also performed to evaluate left ventricular wall motion, and estimate left ventricular ejection fraction.  COMPARISON:  Chest x-ray 05/11/2014  FINDINGS: Perfusion: No decreased activity in the left ventricle on stress imaging to suggest reversible ischemia or infarction. Mildly decreased radiotracer uptake in the inferior wall on both the resting and stress images likely represents diaphragmatic attenuation in a male patient.  Wall Motion: Normal left ventricular wall motion. No left ventricular dilation.  Left Ventricular Ejection Fraction: 61 %  End diastolic volume 76 ml  End systolic volume 29 ml  IMPRESSION: 1. No reversible ischemia or infarction.  2. Normal left ventricular wall motion.  3. Left ventricular ejection fraction 61%  4. Low-risk stress test findings*.  *2012 Appropriate Use Criteria for Coronary Revascularization Focused Update: J Am Coll Cardiol. 6967;89(3):810-175. http://content.[email or web address].aspx?articleid=[email or web address]   Electronically Signed   By: Jacqulynn Cadet M.D.   On: 05/15/2014 11:15   Dg Chest Port 1 View  05/11/2014   CLINICAL DATA:  Acute respiratory failure with difficult intubation, septic shock and aspiration pneumonia  EXAM: PORTABLE CHEST - 1 VIEW  COMPARISON:  05/10/2014   FINDINGS: The cardiac shadow is mildly enlarged but stable. An endotracheal tube is noted 2.6 cm above the carina. A nasogastric catheter and left jugular central line are again seen and stable. The lungs are well aerated with minimal bibasilar atelectatic changes. No focal confluent infiltrate is seen. No pneumothorax is noted. No bony abnormality is seen.  IMPRESSION: Tubes and lines as described above.  Bibasilar atelectatic changes.   Electronically Signed   By: Inez Catalina M.D.   On: 05/11/2014 08:02   Dg Chest Port 1 View  05/10/2014   CLINICAL DATA:  58 year old male found unresponsive in home by roommate at 0630 hr, bradycardia on EMS arrival, intubated. Initial encounter.  EXAM: PORTABLE CHEST - 1 VIEW  COMPARISON:  None.  FINDINGS: Portable AP supine view at 0818 hr. Endotracheal tube in good position at the level the clavicles. Enteric tube courses to the abdomen, side hole the level of the gastric body.  Mildly low lung volumes. Normal cardiac size and mediastinal contours. Visualized tracheal air column is within normal limits. No pneumothorax, pulmonary edema, pleural effusion or confluent pulmonary opacity.  IMPRESSION: 1. ET tube and enteric tube in good position. 2. Mildly low lung volumes.  No acute cardiopulmonary abnormality.   Electronically Signed   By: Lars Pinks M.D.   On: 05/10/2014 08:34   Dg Chest Port 1v Same Day  05/10/2014   CLINICAL DATA:  Central catheter placement ; hypoxia  EXAM: PORTABLE CHEST - 1 VIEW SAME DAY  COMPARISON:  Study obtained earlier in the day  FINDINGS: Central catheter tip is in the left innominate vein. Endotracheal tube tip is 4.1 cm above the carina. Nasogastric tube tip and side port are in the stomach. No pneumothorax. There is no edema or consolidation. Heart is upper normal in size with pulmonary vascularity within normal limits. Thoracolumbar levoscoliosis is stable.  IMPRESSION: New central catheter tip is in the left innominate vein. Tube positions  as described. No pneumothorax. No lung edema or consolidation.   Electronically Signed   By: Lowella Grip M.D.   On: 05/10/2014 10:48    Oren Binet, MD  [facility] Pager:[phone]  If 7PM-7AM, please contact night-coverage [email or web address] Password [email or web address] 05/16/2014, 12:48 PM   LOS: 6 days   **Disclaimer: This note may have been dictated with voice recognition software. Similar  sounding words can inadvertently be transcribed and this note may contain transcription errors which may not have been corrected upon publication of note.**

## 2014-05-17 ENCOUNTER — Inpatient Hospital Stay (HOSPITAL_COMMUNITY): Payer: Medicaid Other

## 2014-05-17 ENCOUNTER — Encounter (HOSPITAL_COMMUNITY): Payer: Self-pay | Admitting: Radiology

## 2014-05-17 DIAGNOSIS — R55 Syncope and collapse: Secondary | ICD-10-CM

## 2014-05-17 DIAGNOSIS — R001 Bradycardia, unspecified: Secondary | ICD-10-CM

## 2014-05-17 LAB — BASIC METABOLIC PANEL
ANION GAP: 11 (ref 5–15)
BUN: 16 mg/dL (ref 6–23)
CALCIUM: 8.9 mg/dL (ref 8.4–10.5)
CO2: 30 mEq/L (ref 19–32)
CREATININE: 0.57 mg/dL (ref 0.50–1.35)
Chloride: 98 mEq/L (ref 96–112)
GFR calc Af Amer: >90 mL/min (ref >90)
GFR calc non Af Amer: >90 mL/min (ref >90)
Glucose, Bld: 116 mg/dL — ABNORMAL HIGH (ref 70–99)
Potassium: 3.4 mEq/L — ABNORMAL LOW (ref 3.7–5.3)
Sodium: 139 mEq/L (ref 137–147)

## 2014-05-17 LAB — GLUCOSE, CAPILLARY
GLUCOSE-CAPILLARY: 112 mg/dL — AB (ref 70–99)
GLUCOSE-CAPILLARY: 119 mg/dL — AB (ref 70–99)
Glucose-Capillary: 103 mg/dL — ABNORMAL HIGH (ref 70–99)
Glucose-Capillary: 109 mg/dL — ABNORMAL HIGH (ref 70–99)
Glucose-Capillary: 112 mg/dL — ABNORMAL HIGH (ref 70–99)
Glucose-Capillary: 114 mg/dL — ABNORMAL HIGH (ref 70–99)
Glucose-Capillary: 119 mg/dL — ABNORMAL HIGH (ref 70–99)

## 2014-05-17 MED ORDER — IOHEXOL 300 MG/ML  SOLN
50.0000 mL | Freq: Once | INTRAMUSCULAR | Status: AC | PRN
  Administered 2014-05-17: 20 mL via ORAL

## 2014-05-17 MED ORDER — LIDOCAINE-EPINEPHRINE (PF) 1 %-1:200000 IJ SOLN
INTRAMUSCULAR | Status: AC
  Filled 2014-05-17: qty 10

## 2014-05-17 MED ORDER — GLUCAGON HCL RDNA (DIAGNOSTIC) 1 MG IJ SOLR
INTRAMUSCULAR | Status: AC | PRN
  Administered 2014-05-17: 1 mg via INTRAVENOUS

## 2014-05-17 MED ORDER — MIDAZOLAM HCL 2 MG/2ML IJ SOLN
INTRAMUSCULAR | Status: AC
  Filled 2014-05-17: qty 2

## 2014-05-17 MED ORDER — CEFAZOLIN SODIUM-DEXTROSE 2-3 GM-% IV SOLR
2.0000 g | INTRAVENOUS | Status: DC
  Filled 2014-05-17: qty 50

## 2014-05-17 MED ORDER — VANCOMYCIN HCL IN DEXTROSE 1-5 GM/200ML-% IV SOLN
1000.0000 mg | INTRAVENOUS | Status: AC
  Administered 2014-05-17: 1000 mg via INTRAVENOUS
  Filled 2014-05-17: qty 200

## 2014-05-17 MED ORDER — HEPARIN SOD (PORK) LOCK FLUSH 100 UNIT/ML IV SOLN
INTRAVENOUS | Status: AC
  Filled 2014-05-17: qty 5

## 2014-05-17 MED ORDER — SODIUM CHLORIDE 0.9 % IV SOLN
INTRAVENOUS | Status: AC | PRN
  Administered 2014-05-17: 10 mL/h via INTRAVENOUS

## 2014-05-17 MED ORDER — FENTANYL CITRATE 0.05 MG/ML IJ SOLN
INTRAMUSCULAR | Status: AC | PRN
  Administered 2014-05-17: 25 ug via INTRAVENOUS
  Administered 2014-05-17: 50 ug via INTRAVENOUS

## 2014-05-17 MED ORDER — MIDAZOLAM HCL 2 MG/2ML IJ SOLN
INTRAMUSCULAR | Status: AC | PRN
  Administered 2014-05-17: 0.5 mg via INTRAVENOUS
  Administered 2014-05-17: 1 mg via INTRAVENOUS

## 2014-05-17 MED ORDER — FENTANYL CITRATE 0.05 MG/ML IJ SOLN
INTRAMUSCULAR | Status: AC
  Filled 2014-05-17: qty 2

## 2014-05-17 MED ORDER — CIPROFLOXACIN IN D5W 400 MG/200ML IV SOLN
400.0000 mg | INTRAVENOUS | Status: AC
  Administered 2014-05-17: 400 mg via INTRAVENOUS
  Filled 2014-05-17: qty 200

## 2014-05-17 MED ORDER — GLUCAGON HCL RDNA (DIAGNOSTIC) 1 MG IJ SOLR
INTRAMUSCULAR | Status: AC
  Filled 2014-05-17: qty 1

## 2014-05-17 NOTE — H&P (Signed)
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 Altered Mental Status   Referring Physician(s) [facility]/Oncology  History of Present Illness: Donald Hughes is a 58 y.o. 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 Patient with dysphagia and IR received request for percutaneous gastrostomy tube and port a catheter placement prior to discharge as patient will have chemotherapy and radiation after discharge per Oncology. The patient states he is currently able to swallow. He denies any active chest pain or shortness of breath. He denies any fever or chills. He denies any history of GI bleeding or ulcers. He denies any history of abdominal surgeries. Per chart review he was a difficult intubation secondary to laryngeal edema. He is currently on RA with good oxygen saturation.   Past Medical History  Diagnosis Date  . Difficult intubation 05/10/14    laryngeal edema, difficult DL and video laryngoscopy    History reviewed. No pertinent past surgical history.  Allergies: Review of patient's allergies indicates no known allergies.  Medications: Prior to Admission medications   Not on File    History reviewed. No pertinent family history.  History   Social History  . Marital Status: Legally Separated    Spouse Name: N/A    Number of Children: N/A  . Years of Education: N/A   Social History Main Topics  . Smoking status: Former Research scientist (life sciences)  . Smokeless tobacco: None     Comment: "I quit when I came in here."  . Alcohol Use: None  . Drug Use: Yes    Special: Marijuana  . Sexual Activity: None   Other Topics Concern  . None   Social History Narrative  . None    Review of Systems: A 12 point ROS discussed and pertinent positives are indicated in the HPI above.  All other systems are negative.  Review of Systems  Vital Signs: BP 132/87  Pulse 75  Temp(Src) 97.9  F (36.6 C) (Oral)  Resp 20  Ht 5\' 5"  (1.651 m)  Wt 172 lb 1.6 oz (78.064 kg)  BMI 28.64 kg/m2  SpO2 98%  Physical Exam General: A&Ox3, NAD Heart: RRR without M/G/R Lungs: CTA bilaterally  Abd: Soft, ND, NT, (+) BS  Imaging: Ct Angio Head W/cm &/or Wo Cm  05/10/2014   CLINICAL DATA:  INITIAL ENCOUNTER. CODE STROKE. Patient found unresponsive at 6 o'clock a.m. Last seen well at 1 o'clock a.m.  EXAM: CT ANGIOGRAPHY HEAD AND NECK  TECHNIQUE: Multidetector CT imaging of the head and neck was performed using the standard protocol during bolus administration of intravenous contrast. Multiplanar CT image reconstructions and MIPs were obtained to evaluate the vascular anatomy. Carotid stenosis measurements (when applicable) are obtained utilizing NASCET criteria, using the distal internal carotid diameter as the denominator.  CONTRAST:  52mL OMNIPAQUE IOHEXOL 350 MG/ML SOLN, 64mL OMNIPAQUE IOHEXOL 350 MG/ML SOLN  COMPARISON:  CT head without contrast 05/10/2014.  FINDINGS: CTA HEAD FINDINGS  The source images demonstrate no acute infarct. The basal ganglia are intact. The postcontrast images demonstrate no pathologic enhancement. The patient is intubated. An NG tube is in place.  Atherosclerotic changes are present within the distal internal carotid arteries bilaterally without significant stenosis. The A1 and M1 segments are normal. The anterior communicating artery is patent. The bifurcations are intact. There is moderate attenuation of distal MCA branch vessels bilaterally.  The distal left vertebral artery is narrowed P on the dura. Right  vertebral artery is intact. The left PICA origin is visualized and normal. The right AICA is dominant. The basilar artery is within normal limits. The left posterior cerebral artery originates from the basilar tip. The right posterior cerebral artery is of fetal type. There is moderate attenuation of PCA branch vessels bilaterally.  Review of the MIP images confirms the  above findings.  CTA NECK FINDINGS  A 3 vessel arch configuration is present. Right lateral irregularity and focally ectasia is noted in the proximal innominate artery. The artery measures up to 21 mm in diameter. The proximal vessels are otherwise normal. Atherosclerotic calcifications are present in the aortic arch.  The vertebral artery is slightly dominant to the left. There is focal narrowing of the distal left vertebral artery at the level of C2-2.  The right common carotid artery demonstrates atherosclerotic changes at the bifurcation. There some calcific changes in the proximal right ICA without significant stenosis. The right ICA is tortuous without distal stenosis.  The left common carotid artery is within normal limits. Atherosclerotic changes are noted at the bifurcation and proximal left ICA without significant stenosis. The left ICA is tortuous proximally. The distal left ICA is normal.  The bone windows demonstrate multilevel disc disease with uncovertebral spurring from C3-4 through C6-7. No focal lytic or blastic lesions are present. The lung apices are clear.  Review of the MIP images confirms the above findings.  IMPRESSION: 1. Atherosclerotic changes at the carotid bifurcations bilaterally without significant stenosis. 2. ICA tortuosity is worse right than left without significant stenosis. 3. Focal narrowing of the distal left vertebral artery at the level of C2 and the dura with a small irregular vessel extending to the vertebrobasilar junction. 4. The basilar artery is intact. 5. Focal ectasia of the innominate artery measuring up to 21 mm just above the aortic arch. 6. Atherosclerotic changes within the cavernous carotid arteries bilaterally without significant stenosis. 7. Moderate distal small vessel disease in the MCA and PCA branch vessels. These results were called by telephone at the time of interpretation on 05/10/2014 at 12:12 pm to Dr. Roland Rack, who verbally acknowledged  these results.   Electronically Signed   By: Lawrence Santiago M.D.   On: 05/10/2014 12:44   Ct Head Wo Contrast  05/10/2014   CLINICAL DATA:  Patient unresponsive.  EXAM: CT HEAD WITHOUT CONTRAST  TECHNIQUE: Contiguous axial images were obtained from the base of the skull through the vertex without intravenous contrast.  COMPARISON:  None.  FINDINGS: There is no evidence of acute intracranial abnormality including infarct, hemorrhage, mass lesion, mass effect, midline shift or abnormal extra-axial fluid collection. Mucous retention cysts or polyps are seen in the maxillary sinuses bilaterally. Mastoid air cells and middle ears are clear. The calvarium is intact.  IMPRESSION: No acute intracranial abnormality.  Mucous retention cysts or polyps in the maxillary sinuses.   Electronically Signed   By: Inge Rise M.D.   On: 05/10/2014 09:04   Ct Soft Tissue Neck W Contrast  05/15/2014   CLINICAL DATA:  58 year old male with 2 months of hoarseness without pain. No bleeding or hemoptysis. Recent smoker. Recent intubation. Now extubated. Flexible laryngoscopy performed on 05/14/2014 demonstrated visible serration on lower NG all surface of left area epiglottic fold. Epiglottis with swelling and edema.  EXAM: CT NECK WITH CONTRAST  TECHNIQUE: Multidetector CT imaging of the neck was performed using the standard protocol following the bolus administration of intravenous contrast.  COMPARISON:  Prior CTA of the neck from  05/10/2014  FINDINGS: The visualized portions of the brain and posterior fossa are unremarkable. Orbits within normal limits.  Mild polypoid opacity present within the maxillary sinuses bilaterally. Paranasal sinuses are otherwise clear. No mastoid effusion.  The salivary glands including the parotid glands and submandibular glands are within normal limits.  Oral cavity is normal in appearance without mass lesion or loculated fluid collection. The patient is edentulous. Palatine tonsils are symmetric  bilaterally. Uvula is normal. Parapharyngeal fat is preserved. Nasopharynx and oropharynx within normal limits.  No retropharyngeal fluid collection.  There is mild diffuse epiglottic swelling and edema. Additionally, there is mild edema and swelling along the aryepiglottic folds bilaterally. There is mild irregularity along the left aryepiglottic fold (series 2, image 85), which may reflect the ulceration visualized on recent laryngoscopy. There is fullness of the left piriform sinus without definite submucosal mass lesion. The right piriform sinus is preserved. Additionally, asymmetric fullness in the region of the right pre epiglottic fat and right false vocal cord (series 2, image 81). Evaluation of this area somewhat difficult due to motion artifact. True vocal cords are fairly symmetric inferiorly. Thyroid and cricoid cartilages within normal limits. Subglottic airway is clear.  There is an enlarged right level II 8 node measuring 2 cm in short access (series 2, image 66). Central hypodensity within this note is concerning for necrosis. Adjacent mildly prominent right level II a node measures 8 mm in short axis. While this node measures within normal limits for size, central hypodensity also worrisome for necrosis. No left-sided cervical adenopathy. No supraclavicular lymph nodes.  Thyroid gland within normal limits.  Visualized portions of the lung apices are clear.  Scattered atheromatous plaque present about the carotid bifurcations and proximal internal carotid arteries, better evaluated on recent CTA.  Multilevel degenerative changes noted within the visualized spine. No worrisome lytic or blastic osseous lesions. No acute osseous abnormality.  IMPRESSION: 1. Edema with swelling within the epiglottis and aryepiglottic folds bilaterally, which may be related to recent intubation. Airway remains widely patent. 2. Subtle asymmetric irregularity along the laryngeal surface of the left aryepiglottic fold,  which may reflect the ulcerative lesion seen on flexible laryngoscopy performed earlier on the same day. There is associated fullness within the adjacent left piriform sinus. 3. Apparent fullness and asymmetry within the right pre epiglottic fat extending inferiorly towards the right false vocal cord. Evaluation of this area is somewhat difficult due to motion artifact. Attention at possible future laryngoscopy/biopsy recommended. 4. Enlarged 2.0 cm necrotic right level IIa node, worrisome for nodal metastasis. An adjacent 8 mm node is also worrisome with suggestion of central necrosis, although is within normal limits for size criteria.   Electronically Signed   By: Jeannine Boga M.D.   On: 05/15/2014 07:30   Ct Angio Neck W/cm &/or Wo/cm  05/10/2014   CLINICAL DATA:  INITIAL ENCOUNTER. CODE STROKE. Patient found unresponsive at 6 o'clock a.m. Last seen well at 1 o'clock a.m.  EXAM: CT ANGIOGRAPHY HEAD AND NECK  TECHNIQUE: Multidetector CT imaging of the head and neck was performed using the standard protocol during bolus administration of intravenous contrast. Multiplanar CT image reconstructions and MIPs were obtained to evaluate the vascular anatomy. Carotid stenosis measurements (when applicable) are obtained utilizing NASCET criteria, using the distal internal carotid diameter as the denominator.  CONTRAST:  66mL OMNIPAQUE IOHEXOL 350 MG/ML SOLN, 5mL OMNIPAQUE IOHEXOL 350 MG/ML SOLN  COMPARISON:  CT head without contrast 05/10/2014.  FINDINGS: CTA HEAD FINDINGS  The source images  demonstrate no acute infarct. The basal ganglia are intact. The postcontrast images demonstrate no pathologic enhancement. The patient is intubated. An NG tube is in place.  Atherosclerotic changes are present within the distal internal carotid arteries bilaterally without significant stenosis. The A1 and M1 segments are normal. The anterior communicating artery is patent. The bifurcations are intact. There is moderate  attenuation of distal MCA branch vessels bilaterally.  The distal left vertebral artery is narrowed P on the dura. Right vertebral artery is intact. The left PICA origin is visualized and normal. The right AICA is dominant. The basilar artery is within normal limits. The left posterior cerebral artery originates from the basilar tip. The right posterior cerebral artery is of fetal type. There is moderate attenuation of PCA branch vessels bilaterally.  Review of the MIP images confirms the above findings.  CTA NECK FINDINGS  A 3 vessel arch configuration is present. Right lateral irregularity and focally ectasia is noted in the proximal innominate artery. The artery measures up to 21 mm in diameter. The proximal vessels are otherwise normal. Atherosclerotic calcifications are present in the aortic arch.  The vertebral artery is slightly dominant to the left. There is focal narrowing of the distal left vertebral artery at the level of C2-2.  The right common carotid artery demonstrates atherosclerotic changes at the bifurcation. There some calcific changes in the proximal right ICA without significant stenosis. The right ICA is tortuous without distal stenosis.  The left common carotid artery is within normal limits. Atherosclerotic changes are noted at the bifurcation and proximal left ICA without significant stenosis. The left ICA is tortuous proximally. The distal left ICA is normal.  The bone windows demonstrate multilevel disc disease with uncovertebral spurring from C3-4 through C6-7. No focal lytic or blastic lesions are present. The lung apices are clear.  Review of the MIP images confirms the above findings.  IMPRESSION: 1. Atherosclerotic changes at the carotid bifurcations bilaterally without significant stenosis. 2. ICA tortuosity is worse right than left without significant stenosis. 3. Focal narrowing of the distal left vertebral artery at the level of C2 and the dura with a small irregular vessel  extending to the vertebrobasilar junction. 4. The basilar artery is intact. 5. Focal ectasia of the innominate artery measuring up to 21 mm just above the aortic arch. 6. Atherosclerotic changes within the cavernous carotid arteries bilaterally without significant stenosis. 7. Moderate distal small vessel disease in the MCA and PCA branch vessels. These results were called by telephone at the time of interpretation on 05/10/2014 at 12:12 pm to Dr. Roland Rack, who verbally acknowledged these results.   Electronically Signed   By: Lawrence Santiago M.D.   On: 05/10/2014 12:44   Ct Angio Chest Pe W/cm &/or Wo Cm  05/10/2014   CLINICAL DATA:  Found on couch unresponsive. Shortness of breath for 3-4 days.  EXAM: CT ANGIOGRAPHY CHEST WITH CONTRAST  TECHNIQUE: Multidetector CT imaging of the chest was performed using the standard protocol during bolus administration of intravenous contrast. Multiplanar CT image reconstructions and MIPs were obtained to evaluate the vascular anatomy.  CONTRAST:  26mL OMNIPAQUE IOHEXOL 350 MG/ML SOLN, 68mL OMNIPAQUE IOHEXOL 350 MG/ML SOLN  COMPARISON:  None  FINDINGS: Mediastinum: The heart size appears normal. No pericardial effusion. The trachea appears patent. There is an endotracheal tube with tip above the carina. Nasogastric tube is identified within the esophagus. There is no mediastinal or hilar adenopathy identified. No supraclavicular or axillary adenopathy. There is calcified atherosclerotic disease  involving the thoracic aorta. Calcified plaque is also noted within the LAD and RCA coronary arteries. Normal appearance of the pulmonary artery. No lobar or segmental filling defects identified to suggest pulmonary embolus.  Lungs/Pleura: There is no pleural effusion identified. Dependent changes noted in the right lung base. No airspace consolidation. Left upper lobe lung nodule measures 8 mm.  Upper Abdomen: Incidental imaging through the upper abdomen is unremarkable.   Musculoskeletal: Review of the visualized bony structures is significant for thoracic kyphosis. There is multi level degenerative disc disease identified. No acute bone abnormality noted.  Review of the MIP images confirms the above findings.  IMPRESSION: 1. Exam is negative for acute pulmonary embolus. 2. Atherosclerotic disease including multi vessel coronary artery calcification. 3. Left lung nodule measures 8 mm If the patient is at high risk for bronchogenic carcinoma, follow-up chest CT at 3-25months is recommended. If the patient is at low risk for bronchogenic carcinoma, follow-up chest CT at 6-12 months is recommended. This recommendation follows the consensus statement: Guidelines for Management of Small Pulmonary Nodules Detected on CT Scans: A Statement from the [employer] as published in Radiology 2005; 237:395-400.   Electronically Signed   By: Kerby Moors M.D.   On: 05/10/2014 12:29   Mr Brain Wo Contrast  05/12/2014   CLINICAL DATA:  58 year old male last seen normal at 1 a.m. on 05/10/2014. Found unresponsive. On admission was unresponsive with bradycardia, and atrial fibrillation. No known past medical history. Improving altered mental status.  EXAM: MRI HEAD WITHOUT CONTRAST  TECHNIQUE: Multiplanar, multiecho pulse sequences of the brain and surrounding structures were obtained without intravenous contrast.  COMPARISON:  CT head 05/10/2014.  CT angio head and neck 05/10/2014.  FINDINGS: Motion degraded exam.  Overall study diagnostic.  Widespread areas of restricted diffusion affecting primarily the BILATERAL globus pallidus, other deep nuclei, and periventricular white matter, predominantly supratentorial, but with involvement of the cerebellum, consistent with a hypoxic/ischemic and/or hypotensive insult. Carbon monoxide poisoning can also have this appearance, but given the punctate deep white matter areas of restricted diffusion, is less favored. No hemorrhage, mass lesion,  hydrocephalus, or extra-axial fluid. Premature for age atrophy. Mild subcortical and periventricular T2 and FLAIR hyperintensities, likely chronic microvascular ischemic change. 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 Flow voids are maintained in the carotid, basilar, and vertebral arteries. There is no obvious head trauma or osseous lesion. Upper cervical region unremarkable.  Compared with prior CT studies, the these changes are not visible.  IMPRESSION: Widespread areas of restricted diffusion involving both the deep nuclei (predominately globus pallidus) as well as the deep white matter in a watershed pattern. Findings are consistent with a hypoxic/ischemic and/or hypertensive insult. Carbon monoxide poisoning has some similar imaging features; see discussion above.   Electronically Signed   By: Rolla Flatten M.D.   On: 05/12/2014 10:46   Nm Myocar Multi W/spect W/wall Motion / Ef  05/15/2014   CLINICAL DATA:  58 year old male recently admitted with respiratory failure, aspiration pneumonia and septic shock. Additionally, had atrial fibrillation with rapid ventricular response and elevated troponins thought to be related O2 demand ischemia from the AFib with RVR. Nuclear medicine cardiac stress test warranted to evaluate for reversible ischemia.  EXAM: MYOCARDIAL IMAGING WITH SPECT (REST AND PHARMACOLOGIC-STRESS)  GATED LEFT VENTRICULAR WALL MOTION STUDY  LEFT VENTRICULAR EJECTION FRACTION  TECHNIQUE: Standard myocardial SPECT imaging was performed after resting intravenous injection of 10 mCi  Tc-10m sestamibi. Subsequently, intravenous infusion of Lexiscan was performed under the supervision of the Cardiology staff. At peak effect of the drug, 30 mCi Tc-63m sestamibi was injected intravenously and standard myocardial SPECT imaging was performed. Quantitative gated imaging was also performed to evaluate left  ventricular wall motion, and estimate left ventricular ejection fraction.  COMPARISON:  Chest x-ray 05/11/2014  FINDINGS: Perfusion: No decreased activity in the left ventricle on stress imaging to suggest reversible ischemia or infarction. Mildly decreased radiotracer uptake in the inferior wall on both the resting and stress images likely represents diaphragmatic attenuation in a male patient.  Wall Motion: Normal left ventricular wall motion. No left ventricular dilation.  Left Ventricular Ejection Fraction: 61 %  End diastolic volume 76 ml  End systolic volume 29 ml  IMPRESSION: 1. No reversible ischemia or infarction.  2. Normal left ventricular wall motion.  3. Left ventricular ejection fraction 61%  4. Low-risk stress test findings*.  *2012 Appropriate Use Criteria for Coronary Revascularization Focused Update: J Am Coll Cardiol. 3976;73(4):193-790. http://content.airportbarriers.com.aspx?articleid=1201161   Electronically Signed   By: Jacqulynn Cadet M.D.   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  EXAM: ULTRASOUND GUIDED CORE BIOPSY OF RIGHT CERVICAL LYMPHADENOPATHY  MEDICATIONS: 1.0 mg IV Versed; 25 mcg IV Fentanyl  Total Moderate Sedation Time: 13 min  COMPARISON:  CT of the neck on 05/14/2014  PROCEDURE: The procedure, risks, benefits, and alternatives were explained to the patient. Questions regarding the procedure were encouraged and answered. The patient understands and consents to the procedure.  The right neck was prepped with Betadine in a sterile fashion, and a sterile drape was applied covering the operative field. A sterile gown and sterile gloves were used for the procedure. A time-out procedure was performed. Local anesthesia was provided with 1% Lidocaine.  Ultrasound was performed of the right upper neck. After localizing an enlarged lymph  node, an 18 gauge core needle biopsy device was utilized in performing core biopsy. A total of 5 samples were obtained and submitted in saline for histologic analysis. Post biopsy imaging was performed by ultrasound.  COMPLICATIONS: None.  FINDINGS: Lobulated and heterogeneous appearing enlarged right upper cervical lymph node corresponds to the partially necrotic appearing level II node by CT. This lymph node measures approximately 2.4 cm in greatest diameter by ultrasound. Solid tissue was obtained from the lymph node.  IMPRESSION: Ultrasound-guided core biopsy performed of enlarged right upper cervical lymph node.   Electronically Signed   By: Aletta Edouard M.D.   On: 05/16/2014 19:00   Dg Chest Port 1v Same Day  05/10/2014   CLINICAL DATA:  Central catheter placement ; hypoxia  EXAM: PORTABLE CHEST - 1 VIEW SAME DAY  COMPARISON:  Study obtained earlier in the day  FINDINGS: Central catheter tip is in the left innominate vein. Endotracheal tube tip is 4.1 cm above the carina. Nasogastric tube tip and side port are in the stomach. No pneumothorax. There is no edema or consolidation. Heart is upper normal in size with pulmonary vascularity within normal limits. Thoracolumbar levoscoliosis is stable.  IMPRESSION: New central catheter tip is in the left innominate vein. Tube positions as described. No pneumothorax. No lung edema or consolidation.   Electronically Signed   By: Lowella Grip M.D.   On: 05/10/2014 10:48   Labs:  CBC:  Recent Labs  05/11/14 0320 05/12/14 0304 05/13/14 0215  05/16/14 0547  WBC 10.9* 11.4* 12.0* 8.8  HGB 11.2* 11.1* 11.6* 14.4  HCT 36.0* 37.4* 39.8 45.3  PLT 169 170 186 193    COAGS:  Recent Labs  05/10/14 1130 05/16/14 0547  INR 1.37 1.11  APTT 27  --     BMP:  Recent Labs  05/12/14 0304 05/13/14 0215 05/16/14 0547 05/17/14 0527  NA 146 144 140 139  K 4.8 4.6 3.3* 3.4*  CL 109 103 95* 98  CO2 32 34* 35* 30  GLUCOSE 125* 84 100* 116*  BUN 22  21 23 16   CALCIUM 8.6 9.0 9.2 8.9  CREATININE 0.68 0.67 0.63 0.57  GFRNONAA >90 >90 >90 >90  GFRAA >90 >90 >90 >90    LIVER FUNCTION TESTS:  Recent Labs  05/10/14 1130  BILITOT 0.3  AST 64*  ALT 31  ALKPHOS 72  PROT 5.9*  ALBUMIN 2.5*   Assessment and Plan: 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  Request for percutaneous gastrostomy tube and port a catheter placement with moderate sedation today Patient is currently able to swallow, has been NPO, thinners held and pre-procedure antibiotics have been ordered and labs and imaging reviewed. Plan for chemotherapy and radiation with surgical salvage if necessary.  Risks and Benefits discussed with the patient. All of the patient's questions were answered, patient is agreeable to proceed. Consent signed and in chart. Respiratory failure s/p intubation and extubation on RA 98% today.  Acute encephalopathy secondary to hypoxic event, improved.  Seizure activity, Neurology seen on [place].  Recent bradycardia/A. Fib RVR in setting of SIRS now NSR- not an anticoagulation candidate.  Elevated troponin - demand ischemia, Lexiscan 10/6 EF 61% and no reversible ischemia.     Thank you for this interesting consult.  I greatly enjoyed meeting Donald Hughes and look forward to participating in their care.   I spent a total of 60 minutes face to face in clinical consultation, greater than 50% of which was counseling/coordinating care  Signed: Hedy Jacob 05/17/2014, 10:28 AM

## 2014-05-17 NOTE — Sedation Documentation (Signed)
Patient denies pain and is resting comfortably.  

## 2014-05-17 NOTE — Progress Notes (Signed)
OT Cancellation Note  Patient Details Name: Donald Hughes MRN: [id number] DOB: Feb 20, 1956   Cancelled Treatment:    Reason Eval/Treat Not Completed: Other (comment) (pt about to go down for PEG and port a cath procedures and wants to rest)  [name] A 05/17/2014, 11:20 AM

## 2014-05-17 NOTE — Sedation Documentation (Signed)
Vital signs stable. 97% on room air.

## 2014-05-17 NOTE — Progress Notes (Signed)
05/17/2014 9:46 AM  Donald Hughes [id number]     Temp:  [97.5 F (36.4 C)-98.4 F (36.9 C)] 97.9 F (36.6 C) (10/08 0454) Pulse Rate:  [66-75] 75 (10/08 0454) Resp:  [18-20] 20 (10/08 0454) BP: (124-161)/(81-100) 132/87 mmHg (10/08 0930) SpO2:  [96 %-98 %] 98 % (10/08 0454) Weight:  [78.064 kg (172 lb 1.6 oz)] 78.064 kg (172 lb 1.6 oz) (10/08 0226),     Intake/Output Summary (Last 24 hours) at 05/17/14 0946 Last data filed at 05/16/14 2125  Gross per 24 hour  Intake    600 ml  Output    300 ml  Net    300 ml    Results for orders placed during the hospital encounter of 05/10/14 (from the past 24 hour(s))  GLUCOSE, CAPILLARY     Status: None   Collection Time    05/16/14 12:12 PM      Result Value Ref Range   Glucose-Capillary 82  70 - 99 mg/dL  GLUCOSE, CAPILLARY     Status: None   Collection Time    05/16/14  4:17 PM      Result Value Ref Range   Glucose-Capillary 95  70 - 99 mg/dL  GLUCOSE, CAPILLARY     Status: None   Collection Time    05/16/14  8:44 PM      Result Value Ref Range   Glucose-Capillary 97  70 - 99 mg/dL   Comment 1 Notify RN     Comment 2 Documented in Chart    GLUCOSE, CAPILLARY     Status: Abnormal   Collection Time    05/17/14 12:22 AM      Result Value Ref Range   Glucose-Capillary 103 (*) 70 - 99 mg/dL   Comment 1 Notify RN     Comment 2 Documented in Chart    GLUCOSE, CAPILLARY     Status: Abnormal   Collection Time    05/17/14  4:05 AM      Result Value Ref Range   Glucose-Capillary 109 (*) 70 - 99 mg/dL   Comment 1 Notify RN     Comment 2 Documented in Chart    BASIC METABOLIC PANEL     Status: Abnormal   Collection Time    05/17/14  5:27 AM      Result Value Ref Range   Sodium 139  137 - 147 mEq/L   Potassium 3.4 (*) 3.7 - 5.3 mEq/L   Chloride 98  96 - 112 mEq/L   CO2 30  19 - 32 mEq/L   Glucose, Bld 116 (*) 70 - 99 mg/dL   BUN 16  6 - 23 mg/dL   Creatinine, Ser 0.57  0.50 - 1.35 mg/dL   Calcium 8.9  8.4 - 10.5 mg/dL   GFR calc non Af Amer >90  >90 mL/min   GFR calc Af Amer >90  >90 mL/min   Anion gap 11  5 - 15  GLUCOSE, CAPILLARY     Status: Abnormal   Collection Time    05/17/14  7:45 AM      Result Value Ref Range   Glucose-Capillary 119 (*) 70 - 99 mg/dL    SUBJECTIVE:  Neck feels OK.  Breathing actually pretty good today.  Discussed his case at tumor board yesterday. He is hoping to get treatment in [place].  He will probably be a candidate for organ preservation protocol with chemo and RT, with surgical salvage if necessary.  OBJECTIVE:  Voice raspy.  Breathing  OK.    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  Pathology pending  PLAN:  On schedule for portacath and PEG placement today.  Will review path when available.  At present, have avoided endolaryngeal biopsy which might precipitate airway compromise, and will hope to defer/avoid tracheostomy.    Jodi Marble

## 2014-05-17 NOTE — Progress Notes (Signed)
PT Cancellation Note  Patient Details Name: Donald Hughes MRN: [id number] DOB: 06-06-1956   Cancelled Treatment:    Reason Eval/Treat Not Completed: Patient at procedure or test/unavailable   Ramond Dial 05/17/2014, 11:43 AM Mee Hives, PT [facility] Acute Rehab Dept. Number: [phone]

## 2014-05-17 NOTE — Progress Notes (Signed)
Patient Name: Donald Hughes Date of Encounter: 05/17/2014     Active Problems:   Respiratory failure, acute   Altered mental status   Septic shock   Aspiration pneumonia   Atrial fibrillation   Elevated troponin   Demand ischemia    SUBJECTIVE  Feeling ok this morning, breathing well. Has not eaten anything in days. Anxious of about today's procedure.  CURRENT MEDS . antiseptic oral rinse  7 mL Mouth Rinse QID  . aspirin  150 mg Rectal Daily  . chlorhexidine  15 mL Mouth Rinse BID  . ciprofloxacin  400 mg Intravenous On Call  . folic acid  1 mg Intravenous Daily  . [START ON 05/18/2014] heparin subcutaneous  5,000 Units Subcutaneous 3 times per day  . Influenza vac split quadrivalent PF  0.5 mL Intramuscular Tomorrow-1000  . insulin aspart  0-15 Units Subcutaneous 6 times per day  . lidocaine  15 mL Mouth/Throat Once  . lisinopril  10 mg Oral Daily  . thiamine IV  100 mg Intravenous Daily  . vancomycin  1,000 mg Intravenous On Call    OBJECTIVE  Filed Vitals:   05/16/14 2124 05/17/14 0226 05/17/14 0454 05/17/14 0930  BP: 124/96  134/92 132/87  Pulse: 66  75   Temp: 97.9 F (36.6 C)  97.9 F (36.6 C)   TempSrc: Oral  Oral   Resp: 18  20   Height:      Weight:  172 lb 1.6 oz (78.064 kg)    SpO2: 96%  98%     Intake/Output Summary (Last 24 hours) at 05/17/14 1201 Last data filed at 05/16/14 2125  Gross per 24 hour  Intake    600 ml  Output    300 ml  Net    300 ml   Filed Weights   05/15/14 0451 05/16/14 0500 05/17/14 0226  Weight: 174 lb (78.926 kg) 172 lb 4.8 oz (78.155 kg) 172 lb 1.6 oz (78.064 kg)    PHYSICAL EXAM  General: Pleasant, NAD. Neuro: Alert and oriented X 3. Moves all extremities spontaneously. Psych: Normal affect. HEENT:  Normal, coarse voice  Neck: Supple without bruits or JVD. Lungs:  Resp regular and unlabored, anterior CTA, mildly diminished breath sound Heart: RRR no s3, s4, or murmurs. Abdomen: Soft, non-tender,  non-distended, BS + x 4.  Extremities: No clubbing, cyanosis or edema. DP/PT/Radials 2+ and equal bilaterally.  Accessory Clinical Findings  CBC  Recent Labs  05/16/14 0547  WBC 8.8  HGB 14.4  HCT 45.3  MCV 77.7*  PLT 101   Basic Metabolic Panel  Recent Labs  05/16/14 0547 05/17/14 0527  NA 140 139  K 3.3* 3.4*  CL 95* 98  CO2 35* 30  GLUCOSE 100* 116*  BUN 23 16  CREATININE 0.63 0.57  CALCIUM 9.2 8.9    TELE NSR with occasional junctional rhythm    ECG  No new ekg  Echocardiogram 05/11/2014  LV EF: 65% - 70%  ------------------------------------------------------------------- Indications: Respiratory Failure acute 518.81.  ------------------------------------------------------------------- Study Conclusions  - Left ventricle: The cavity size was mildly dilated. Wall thickness was normal. Systolic function was vigorous. The estimated ejection fraction was in the range of 65% to 70%. Doppler parameters are consistent with abnormal left ventricular relaxation (grade 1 diastolic dysfunction). - Left atrium: The atrium was mildly dilated.      Radiology/Studies  Ct Angio Head W/cm &/or Wo Cm  05/10/2014   CLINICAL DATA:  INITIAL ENCOUNTER. CODE STROKE. Patient found unresponsive  at 6 o'clock a.m. Last seen well at 1 o'clock a.m.  EXAM: CT ANGIOGRAPHY HEAD AND NECK  TECHNIQUE: Multidetector CT imaging of the head and neck was performed using the standard protocol during bolus administration of intravenous contrast. Multiplanar CT image reconstructions and MIPs were obtained to evaluate the vascular anatomy. Carotid stenosis measurements (when applicable) are obtained utilizing NASCET criteria, using the distal internal carotid diameter as the denominator.  CONTRAST:  76mL OMNIPAQUE IOHEXOL 350 MG/ML SOLN, 10mL OMNIPAQUE IOHEXOL 350 MG/ML SOLN  COMPARISON:  CT head without contrast 05/10/2014.  FINDINGS: CTA HEAD FINDINGS  The source images demonstrate no acute  infarct. The basal ganglia are intact. The postcontrast images demonstrate no pathologic enhancement. The patient is intubated. An NG tube is in place.  Atherosclerotic changes are present within the distal internal carotid arteries bilaterally without significant stenosis. The A1 and M1 segments are normal. The anterior communicating artery is patent. The bifurcations are intact. There is moderate attenuation of distal MCA branch vessels bilaterally.  The distal left vertebral artery is narrowed P on the dura. Right vertebral artery is intact. The left PICA origin is visualized and normal. The right AICA is dominant. The basilar artery is within normal limits. The left posterior cerebral artery originates from the basilar tip. The right posterior cerebral artery is of fetal type. There is moderate attenuation of PCA branch vessels bilaterally.  Review of the MIP images confirms the above findings.  CTA NECK FINDINGS  A 3 vessel arch configuration is present. Right lateral irregularity and focally ectasia is noted in the proximal innominate artery. The artery measures up to 21 mm in diameter. The proximal vessels are otherwise normal. Atherosclerotic calcifications are present in the aortic arch.  The vertebral artery is slightly dominant to the left. There is focal narrowing of the distal left vertebral artery at the level of C2-2.  The right common carotid artery demonstrates atherosclerotic changes at the bifurcation. There some calcific changes in the proximal right ICA without significant stenosis. The right ICA is tortuous without distal stenosis.  The left common carotid artery is within normal limits. Atherosclerotic changes are noted at the bifurcation and proximal left ICA without significant stenosis. The left ICA is tortuous proximally. The distal left ICA is normal.  The bone windows demonstrate multilevel disc disease with uncovertebral spurring from C3-4 through C6-7. No focal lytic or blastic lesions  are present. The lung apices are clear.  Review of the MIP images confirms the above findings.  IMPRESSION: 1. Atherosclerotic changes at the carotid bifurcations bilaterally without significant stenosis. 2. ICA tortuosity is worse right than left without significant stenosis. 3. Focal narrowing of the distal left vertebral artery at the level of C2 and the dura with a small irregular vessel extending to the vertebrobasilar junction. 4. The basilar artery is intact. 5. Focal ectasia of the innominate artery measuring up to 21 mm just above the aortic arch. 6. Atherosclerotic changes within the cavernous carotid arteries bilaterally without significant stenosis. 7. Moderate distal small vessel disease in the MCA and PCA branch vessels. These results were called by telephone at the time of interpretation on 05/10/2014 at 12:12 pm to Dr. Roland Rack, who verbally acknowledged these results.   Electronically Signed   By: Lawrence Santiago M.D.   On: 05/10/2014 12:44   Ct Head Wo Contrast  05/10/2014   CLINICAL DATA:  Patient unresponsive.  EXAM: CT HEAD WITHOUT CONTRAST  TECHNIQUE: Contiguous axial images were obtained from the base of  the skull through the vertex without intravenous contrast.  COMPARISON:  None.  FINDINGS: There is no evidence of acute intracranial abnormality including infarct, hemorrhage, mass lesion, mass effect, midline shift or abnormal extra-axial fluid collection. Mucous retention cysts or polyps are seen in the maxillary sinuses bilaterally. Mastoid air cells and middle ears are clear. The calvarium is intact.  IMPRESSION: No acute intracranial abnormality.  Mucous retention cysts or polyps in the maxillary sinuses.   Electronically Signed   By: Inge Rise M.D.   On: 05/10/2014 09:04   Ct Soft Tissue Neck W Contrast  05/15/2014   CLINICAL DATA:  58 year old male with 2 months of hoarseness without pain. No bleeding or hemoptysis. Recent smoker. Recent intubation. Now extubated.  Flexible laryngoscopy performed on 05/14/2014 demonstrated visible serration on lower NG all surface of left area epiglottic fold. Epiglottis with swelling and edema.  EXAM: CT NECK WITH CONTRAST  TECHNIQUE: Multidetector CT imaging of the neck was performed using the standard protocol following the bolus administration of intravenous contrast.  COMPARISON:  Prior CTA of the neck from 05/10/2014  FINDINGS: The visualized portions of the brain and posterior fossa are unremarkable. Orbits within normal limits.  Mild polypoid opacity present within the maxillary sinuses bilaterally. Paranasal sinuses are otherwise clear. No mastoid effusion.  The salivary glands including the parotid glands and submandibular glands are within normal limits.  Oral cavity is normal in appearance without mass lesion or loculated fluid collection. The patient is edentulous. Palatine tonsils are symmetric bilaterally. Uvula is normal. Parapharyngeal fat is preserved. Nasopharynx and oropharynx within normal limits.  No retropharyngeal fluid collection.  There is mild diffuse epiglottic swelling and edema. Additionally, there is mild edema and swelling along the aryepiglottic folds bilaterally. There is mild irregularity along the left aryepiglottic fold (series 2, image 85), which may reflect the ulceration visualized on recent laryngoscopy. There is fullness of the left piriform sinus without definite submucosal mass lesion. The right piriform sinus is preserved. Additionally, asymmetric fullness in the region of the right pre epiglottic fat and right false vocal cord (series 2, image 81). Evaluation of this area somewhat difficult due to motion artifact. True vocal cords are fairly symmetric inferiorly. Thyroid and cricoid cartilages within normal limits. Subglottic airway is clear.  There is an enlarged right level II 8 node measuring 2 cm in short access (series 2, image 66). Central hypodensity within this note is concerning for  necrosis. Adjacent mildly prominent right level II a node measures 8 mm in short axis. While this node measures within normal limits for size, central hypodensity also worrisome for necrosis. No left-sided cervical adenopathy. No supraclavicular lymph nodes.  Thyroid gland within normal limits.  Visualized portions of the lung apices are clear.  Scattered atheromatous plaque present about the carotid bifurcations and proximal internal carotid arteries, better evaluated on recent CTA.  Multilevel degenerative changes noted within the visualized spine. No worrisome lytic or blastic osseous lesions. No acute osseous abnormality.  IMPRESSION: 1. Edema with swelling within the epiglottis and aryepiglottic folds bilaterally, which may be related to recent intubation. Airway remains widely patent. 2. Subtle asymmetric irregularity along the laryngeal surface of the left aryepiglottic fold, which may reflect the ulcerative lesion seen on flexible laryngoscopy performed earlier on the same day. There is associated fullness within the adjacent left piriform sinus. 3. Apparent fullness and asymmetry within the right pre epiglottic fat extending inferiorly towards the right false vocal cord. Evaluation of this area is somewhat difficult due  to motion artifact. Attention at possible future laryngoscopy/biopsy recommended. 4. Enlarged 2.0 cm necrotic right level IIa node, worrisome for nodal metastasis. An adjacent 8 mm node is also worrisome with suggestion of central necrosis, although is within normal limits for size criteria.   Electronically Signed   By: Jeannine Boga M.D.   On: 05/15/2014 07:30   Ct Angio Neck W/cm &/or Wo/cm  05/10/2014   CLINICAL DATA:  INITIAL ENCOUNTER. CODE STROKE. Patient found unresponsive at 6 o'clock a.m. Last seen well at 1 o'clock a.m.  EXAM: CT ANGIOGRAPHY HEAD AND NECK  TECHNIQUE: Multidetector CT imaging of the head and neck was performed using the standard protocol during bolus  administration of intravenous contrast. Multiplanar CT image reconstructions and MIPs were obtained to evaluate the vascular anatomy. Carotid stenosis measurements (when applicable) are obtained utilizing NASCET criteria, using the distal internal carotid diameter as the denominator.  CONTRAST:  66mL OMNIPAQUE IOHEXOL 350 MG/ML SOLN, 16mL OMNIPAQUE IOHEXOL 350 MG/ML SOLN  COMPARISON:  CT head without contrast 05/10/2014.  FINDINGS: CTA HEAD FINDINGS  The source images demonstrate no acute infarct. The basal ganglia are intact. The postcontrast images demonstrate no pathologic enhancement. The patient is intubated. An NG tube is in place.  Atherosclerotic changes are present within the distal internal carotid arteries bilaterally without significant stenosis. The A1 and M1 segments are normal. The anterior communicating artery is patent. The bifurcations are intact. There is moderate attenuation of distal MCA branch vessels bilaterally.  The distal left vertebral artery is narrowed P on the dura. Right vertebral artery is intact. The left PICA origin is visualized and normal. The right AICA is dominant. The basilar artery is within normal limits. The left posterior cerebral artery originates from the basilar tip. The right posterior cerebral artery is of fetal type. There is moderate attenuation of PCA branch vessels bilaterally.  Review of the MIP images confirms the above findings.  CTA NECK FINDINGS  A 3 vessel arch configuration is present. Right lateral irregularity and focally ectasia is noted in the proximal innominate artery. The artery measures up to 21 mm in diameter. The proximal vessels are otherwise normal. Atherosclerotic calcifications are present in the aortic arch.  The vertebral artery is slightly dominant to the left. There is focal narrowing of the distal left vertebral artery at the level of C2-2.  The right common carotid artery demonstrates atherosclerotic changes at the bifurcation. There some  calcific changes in the proximal right ICA without significant stenosis. The right ICA is tortuous without distal stenosis.  The left common carotid artery is within normal limits. Atherosclerotic changes are noted at the bifurcation and proximal left ICA without significant stenosis. The left ICA is tortuous proximally. The distal left ICA is normal.  The bone windows demonstrate multilevel disc disease with uncovertebral spurring from C3-4 through C6-7. No focal lytic or blastic lesions are present. The lung apices are clear.  Review of the MIP images confirms the above findings.  IMPRESSION: 1. Atherosclerotic changes at the carotid bifurcations bilaterally without significant stenosis. 2. ICA tortuosity is worse right than left without significant stenosis. 3. Focal narrowing of the distal left vertebral artery at the level of C2 and the dura with a small irregular vessel extending to the vertebrobasilar junction. 4. The basilar artery is intact. 5. Focal ectasia of the innominate artery measuring up to 21 mm just above the aortic arch. 6. Atherosclerotic changes within the cavernous carotid arteries bilaterally without significant stenosis. 7. Moderate distal small vessel  disease in the MCA and PCA branch vessels. These results were called by telephone at the time of interpretation on 05/10/2014 at 12:12 pm to Dr. Roland Rack, who verbally acknowledged these results.   Electronically Signed   By: Lawrence Santiago M.D.   On: 05/10/2014 12:44   Ct Angio Chest Pe W/cm &/or Wo Cm  05/10/2014   CLINICAL DATA:  Found on couch unresponsive. Shortness of breath for 3-4 days.  EXAM: CT ANGIOGRAPHY CHEST WITH CONTRAST  TECHNIQUE: Multidetector CT imaging of the chest was performed using the standard protocol during bolus administration of intravenous contrast. Multiplanar CT image reconstructions and MIPs were obtained to evaluate the vascular anatomy.  CONTRAST:  74mL OMNIPAQUE IOHEXOL 350 MG/ML SOLN, 41mL  OMNIPAQUE IOHEXOL 350 MG/ML SOLN  COMPARISON:  None  FINDINGS: Mediastinum: The heart size appears normal. No pericardial effusion. The trachea appears patent. There is an endotracheal tube with tip above the carina. Nasogastric tube is identified within the esophagus. There is no mediastinal or hilar adenopathy identified. No supraclavicular or axillary adenopathy. There is calcified atherosclerotic disease involving the thoracic aorta. Calcified plaque is also noted within the LAD and RCA coronary arteries. Normal appearance of the pulmonary artery. No lobar or segmental filling defects identified to suggest pulmonary embolus.  Lungs/Pleura: There is no pleural effusion identified. Dependent changes noted in the right lung base. No airspace consolidation. Left upper lobe lung nodule measures 8 mm.  Upper Abdomen: Incidental imaging through the upper abdomen is unremarkable.  Musculoskeletal: Review of the visualized bony structures is significant for thoracic kyphosis. There is multi level degenerative disc disease identified. No acute bone abnormality noted.  Review of the MIP images confirms the above findings.  IMPRESSION: 1. Exam is negative for acute pulmonary embolus. 2. Atherosclerotic disease including multi vessel coronary artery calcification. 3. Left lung nodule measures 8 mm If the patient is at high risk for bronchogenic carcinoma, follow-up chest CT at 3-69months is recommended. If the patient is at low risk for bronchogenic carcinoma, follow-up chest CT at 6-12 months is recommended. This recommendation follows the consensus statement: Guidelines for Management of Small Pulmonary Nodules Detected on CT Scans: A Statement from the [place] as published in Radiology 2005; 237:395-400.   Electronically Signed   By: Kerby Moors M.D.   On: 05/10/2014 12:29   Mr Brain Wo Contrast  05/12/2014   CLINICAL DATA:  58 year old male last seen normal at 1 a.m. on 05/10/2014. Found unresponsive. On  admission was unresponsive with bradycardia, and atrial fibrillation. No known past medical history. Improving altered mental status.  EXAM: MRI HEAD WITHOUT CONTRAST  TECHNIQUE: Multiplanar, multiecho pulse sequences of the brain and surrounding structures were obtained without intravenous contrast.  COMPARISON:  CT head 05/10/2014.  CT angio head and neck 05/10/2014.  FINDINGS: Motion degraded exam.  Overall study diagnostic.  Widespread areas of restricted diffusion affecting primarily the BILATERAL globus pallidus, other deep nuclei, and periventricular white matter, predominantly supratentorial, but with involvement of the cerebellum, consistent with a hypoxic/ischemic and/or hypotensive insult. Carbon monoxide poisoning can also have this appearance, but given the punctate deep white matter areas of restricted diffusion, is less favored. No hemorrhage, mass lesion, hydrocephalus, or extra-axial fluid. Premature for age atrophy. Mild subcortical and periventricular T2 and FLAIR hyperintensities, likely chronic microvascular ischemic change. 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 Flow voids are maintained in the carotid, basilar, and vertebral arteries. There  is no obvious head trauma or osseous lesion. Upper cervical region unremarkable.  Compared with prior CT studies, the these changes are not visible.  IMPRESSION: Widespread areas of restricted diffusion involving both the deep nuclei (predominately globus pallidus) as well as the deep white matter in a watershed pattern. Findings are consistent with a hypoxic/ischemic and/or hypertensive insult. Carbon monoxide poisoning has some similar imaging features; see discussion above.   Electronically Signed   By: Rolla Flatten M.D.   On: 05/12/2014 10:46   Nm Myocar Multi W/spect W/wall Motion / Ef  05/15/2014   CLINICAL DATA:  58 year old male recently admitted  with respiratory failure, aspiration pneumonia and septic shock. Additionally, had atrial fibrillation with rapid ventricular response and elevated troponins thought to be related O2 demand ischemia from the AFib with RVR. Nuclear medicine cardiac stress test warranted to evaluate for reversible ischemia.  EXAM: MYOCARDIAL IMAGING WITH SPECT (REST AND PHARMACOLOGIC-STRESS)  GATED LEFT VENTRICULAR WALL MOTION STUDY  LEFT VENTRICULAR EJECTION FRACTION  TECHNIQUE: Standard myocardial SPECT imaging was performed after resting intravenous injection of 10 mCi Tc-65m sestamibi. Subsequently, intravenous infusion of Lexiscan was performed under the supervision of the Cardiology staff. At peak effect of the drug, 30 mCi Tc-66m sestamibi was injected intravenously and standard myocardial SPECT imaging was performed. Quantitative gated imaging was also performed to evaluate left ventricular wall motion, and estimate left ventricular ejection fraction.  COMPARISON:  Chest x-ray 05/11/2014  FINDINGS: Perfusion: No decreased activity in the left ventricle on stress imaging to suggest reversible ischemia or infarction. Mildly decreased radiotracer uptake in the inferior wall on both the resting and stress images likely represents diaphragmatic attenuation in a male patient.  Wall Motion: Normal left ventricular wall motion. No left ventricular dilation.  Left Ventricular Ejection Fraction: 61 %  End diastolic volume 76 ml  End systolic volume 29 ml  IMPRESSION: 1. No reversible ischemia or infarction.  2. Normal left ventricular wall motion.  3. Left ventricular ejection fraction 61%  4. Low-risk stress test findings*.  *2012 Appropriate Use Criteria for Coronary Revascularization Focused Update: J Am Coll Cardiol. 2353;61(4):431-540. http://content.airportbarriers.com.aspx?articleid=[email or web address]   Electronically Signed   By: Jacqulynn Cadet M.D.   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  EXAM: ULTRASOUND GUIDED CORE BIOPSY OF RIGHT CERVICAL LYMPHADENOPATHY  MEDICATIONS: 1.0 mg IV Versed; 25 mcg IV Fentanyl  Total Moderate Sedation Time: 13 min  COMPARISON:  CT of the neck on 05/14/2014  PROCEDURE: The procedure, risks, benefits, and alternatives were explained to the patient. Questions regarding the procedure were encouraged and answered. The patient understands and consents to the procedure.  The right neck was prepped with Betadine in a sterile fashion, and a sterile drape was applied covering the operative field. A sterile gown and sterile gloves were used for the procedure. A time-out procedure was performed. Local anesthesia was provided with 1% Lidocaine.  Ultrasound was performed of the right upper neck. After localizing an enlarged lymph node, an 18 gauge core needle biopsy device was utilized in performing core biopsy. A total of 5 samples were obtained and submitted in saline for histologic analysis. Post biopsy imaging was performed by ultrasound.  COMPLICATIONS: None.  FINDINGS: Lobulated and heterogeneous appearing enlarged right upper cervical lymph node corresponds to the partially necrotic appearing level II node by CT. This lymph node measures approximately 2.4 cm in greatest diameter  by ultrasound. Solid tissue was obtained from the lymph node.  IMPRESSION: Ultrasound-guided core biopsy performed of enlarged right upper cervical lymph node.   Electronically Signed   By: Aletta Edouard M.D.   On: 05/16/2014 19:00   Dg Chest Port 1 View  05/11/2014   CLINICAL DATA:  Acute respiratory failure with difficult intubation, septic shock and aspiration pneumonia  EXAM: PORTABLE CHEST - 1 VIEW  COMPARISON:  05/10/2014  FINDINGS: The cardiac shadow is mildly enlarged but stable. An endotracheal tube is noted 2.6 cm above the carina. A nasogastric catheter and left jugular central line  are again seen and stable. The lungs are well aerated with minimal bibasilar atelectatic changes. No focal confluent infiltrate is seen. No pneumothorax is noted. No bony abnormality is seen.  IMPRESSION: Tubes and lines as described above.  Bibasilar atelectatic changes.   Electronically Signed   By: Inez Catalina M.D.   On: 05/11/2014 08:02   Dg Chest Port 1 View  05/10/2014   CLINICAL DATA:  58 year old male found unresponsive in home by roommate at 0630 hr, bradycardia on EMS arrival, intubated. Initial encounter.  EXAM: PORTABLE CHEST - 1 VIEW  COMPARISON:  None.  FINDINGS: Portable AP supine view at 0818 hr. Endotracheal tube in good position at the level the clavicles. Enteric tube courses to the abdomen, side hole the level of the gastric body.  Mildly low lung volumes. Normal cardiac size and mediastinal contours. Visualized tracheal air column is within normal limits. No pneumothorax, pulmonary edema, pleural effusion or confluent pulmonary opacity.  IMPRESSION: 1. ET tube and enteric tube in good position. 2. Mildly low lung volumes.  No acute cardiopulmonary abnormality.   Electronically Signed   By: Lars Pinks M.D.   On: 05/10/2014 08:34   Dg Chest Port 1v Same Day  05/10/2014   CLINICAL DATA:  Central catheter placement ; hypoxia  EXAM: PORTABLE CHEST - 1 VIEW SAME DAY  COMPARISON:  Study obtained earlier in the day  FINDINGS: Central catheter tip is in the left innominate vein. Endotracheal tube tip is 4.1 cm above the carina. Nasogastric tube tip and side port are in the stomach. No pneumothorax. There is no edema or consolidation. Heart is upper normal in size with pulmonary vascularity within normal limits. Thoracolumbar levoscoliosis is stable.  IMPRESSION: New central catheter tip is in the left innominate vein. Tube positions as described. No pneumothorax. No lung edema or consolidation.   Electronically Signed   By: Lowella Grip M.D.   On: 05/10/2014 10:48    ASSESSMENT AND  PLAN  58 yo male found unresponsive with bradycardia and poor resp 10/01, ETT x 2 days, LUL nodule on CT, failed swallow eval, cards following for demand ischemia, EF preserved by echo. Brief afib after admit. Lexiscan MV 10/06  1. Demand ischemia  - peak trop 7 in the setting of acute resp failure, aspiration PNA  - echo 05/11/14 LVEF 65-70% and vigorous, grade I diastolic dysfunction. EKG without specific ischemic changes. CT PE no PE, noted coronary atherosclerotic disease.   - Lexiscan stress test 10/6 EF 61%, no ischemia or infarction, low risk  2. AMS: per neurology, maybe related to laryngeal obstruction/CA  3. ?a-fib  - isolated episode, seen by EP, per Dr. Caryl Comes, a-fib from [facility] was reviewed and in error  - per EP, no a-fib, no indication for anticoagulation. Either way, not a candidate for anticoagulation with likely cancer  - No further cardiology recommendation at this time,  will sign off.  4. Newly diagnosed squamous cell carcinoma of head and neck  - dysphagia started one month prior to admission  - s/p R cervical lymph node biopsy, pending result  - pending PET/CT scan as outpt, Oncology following, may need prophylactic tracheostomy  - pending portacath and PEG placement today   5. Small lung nodule on CTA of chest 10/1  - per oncology    Signed, Almyra Deforest PA-C Pager: [phone]  I have seen and examined the patient along with Almyra Deforest PA-C.  I have reviewed the chart, notes and new data.  I agree with PA's note.   PLAN: The bulk of evidence suggests that he had a primary respiratory event (possibly related to upper airway obstruction?). His arrhythmia is not symptomatic and requires no specific pharmacological therapy at this time. Cardiac w/u negative for structural abnormalities, only atrial arrhythmia. Appears to be at low risk for major cardiac complications if he needs surgery. Make sure he has adequate ventilation/oxygenation. Will sign off - please  call if there are additional questions.  Sanda Klein, MD, [place] [phone] 05/17/2014, 12:34 PM

## 2014-05-17 NOTE — Procedures (Signed)
Successful 20FR GTUBE INSERTION NO COMP STABLE FULL USE TOMORROW

## 2014-05-17 NOTE — Progress Notes (Signed)
PATIENT DETAILS Name: Donald Hughes Age: 58 y.o. Sex: male Date of Birth: 1956-04-24 Admit Date: 05/10/2014 Admitting Physician Rush Farmer, MD PCP:No primary provider on file.  Brief summary Patient is a 58 year old male with a history of smoking, obesity, who developed hoarseness of voice a month prior to this admission, admitted on 10/1 as he was found unresponsive by his girlfriend. Patient was intubated, and admitted to the intensive care unit.During his hospital course, he has had episodes of bradycardia, A. Fib with RVR as well.Further workup has demonstrated a abnormal MRI and a possible seizure focus on his EEG. 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 Workup is in progress   Subjective: No major complaints.  Assessment/Plan: Active Problems:   Respiratory failure, acute -Secondary to failure to protect airway. Intubated on Admission, extubated and transferred to [facility]. Doing well.   Acute encephalopathy - Secondary to hypoxic/ischemic insult.  MRI brain shows widespread areas of restricted diffusion involving both deep nuclei, and deep white matter in a watershed pattern. Mental status has improved, appreciate neurology consultation.Per neurology, patient may have some long-term cognitive issues. He has developed a tremor, likely post ischemic.  Suspected seizures - Further workup since hospitalization- initial EEG per neurology showed irritability-started on Keppra, neurology recommended on the 7 days of Keppra-stop date 10/80.  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 Spoke with Oncology-patient will be followed by Oncology group in [place], Dr Alvy Bimler will arrange. Will go ahead and place G tube and Porta cath. Defer timing of  tracheostomy to ENT.  Elevated troponins -secondary to demand ischemia -Seen by cardiology, nuclear stress test negative for ischemia.  Sinus bradycardia and atrial Tachycardia - Cardiology following, EP appreciated -Reviewed cardiology notes-with newly diagnosed malignancy-not a candidate for anticoagulation. Currently on aspirin.  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 Encephalopathy - Speech therapy following, modified barium swallow done on 05/14/14-continue n.p.o. status. Per reviewed ENT note, likely will need PEG tube placement prior to discharge.    AKI, anion gap acidosis - resolved.  Disposition: Remain inpatient  DVT Prophylaxis: Prophylactic Heparin   Code Status: Full code   Family Communication None at bedside  Procedures:  None  CONSULTS:  pulmonary/intensive care, hematology/oncology and IR,Neuro and ENT   MEDICATIONS: Scheduled Meds: . antiseptic oral rinse  7 mL Mouth Rinse QID  . aspirin  150 mg Rectal Daily  .  ceFAZolin (ANCEF) IV  2 g Intravenous On Call  . chlorhexidine  15 mL Mouth Rinse BID  . folic acid  1 mg Intravenous Daily  . [START ON 05/18/2014] heparin subcutaneous  5,000 Units Subcutaneous 3 times per day  . Influenza vac split quadrivalent PF  0.5 mL Intramuscular Tomorrow-1000  . insulin aspart  0-15 Units Subcutaneous 6 times per day  . lidocaine  15 mL Mouth/Throat Once  . lisinopril  10 mg Oral Daily  . thiamine IV  100 mg Intravenous Daily   Continuous Infusions: . dextrose 5 % and 0.45% NaCl 1,000 mL with potassium chloride 20 mEq infusion 75 mL/hr at 05/16/14 1928   PRN Meds:.hydrALAZINE, ipratropium-albuterol, labetalol  Antibiotics: Anti-infectives   Start     Dose/Rate Route Frequency Ordered Stop   05/17/14 1300  ceFAZolin (ANCEF) IVPB 2 g/50 mL premix     2 g 100 mL/hr  over 30 Minutes Intravenous On call 05/17/14 0856 05/18/14 1300   05/11/14 0000  vancomycin (VANCOCIN) IVPB 1000 mg/200 mL premix  Status:   Discontinued     1,000 mg 200 mL/hr over 60 Minutes Intravenous Every 12 hours 05/10/14 1050 05/12/14 1430   05/10/14 2000  piperacillin-tazobactam (ZOSYN) IVPB 3.375 g  Status:  Discontinued     3.375 g 12.5 mL/hr over 240 Minutes Intravenous Every 8 hours 05/10/14 1050 05/12/14 1430   05/10/14 0930  piperacillin-tazobactam (ZOSYN) IVPB 3.375 g  Status:  Discontinued     3.375 g 100 mL/hr over 30 Minutes Intravenous  Once 05/10/14 0917 05/12/14 1430   05/10/14 0915  vancomycin (VANCOCIN) 1,500 mg in sodium chloride 0.9 % 500 mL IVPB     1,500 mg 250 mL/hr over 120 Minutes Intravenous  Once 05/10/14 0911 05/10/14 1246   05/10/14 0915  piperacillin-tazobactam (ZOSYN) IVPB 2.25 g  Status:  Discontinued     2.25 g 100 mL/hr over 30 Minutes Intravenous  Once 05/10/14 0911 05/10/14 0916       PHYSICAL EXAM: Vital signs in last 24 hours: Filed Vitals:   05/16/14 2100 05/16/14 2124 05/17/14 0226 05/17/14 0454  BP: 130/91 124/96  134/92  Pulse: 71 66  75  Temp: 98 F (36.7 C) 97.9 F (36.6 C)  97.9 F (36.6 C)  TempSrc: Oral Oral  Oral  Resp: 20 18  20   Height:      Weight:   78.064 kg (172 lb 1.6 oz)   SpO2: 96% 96%  98%    Weight change: -0.091 kg (-3.2 oz) Filed Weights   05/15/14 0451 05/16/14 0500 05/17/14 0226  Weight: 78.926 kg (174 lb) 78.155 kg (172 lb 4.8 oz) 78.064 kg (172 lb 1.6 oz)   Body mass index is 28.64 kg/(m^2).   Gen Exam: Awake and alert with clear speech.   Neck: Supple, No JVD.   Chest: B/L Clear.  CVS: S1 S2 Regular, no murmurs.  Abdomen: soft, BS +, non tender, non distended.  Extremities: no edema, lower extremities warm to touch. Neurologic: Non Focal.   Skin: No Rash.   Wounds: N/A.    Intake/Output from previous day:  Intake/Output Summary (Last 24 hours) at 05/17/14 0922 Last data filed at 05/16/14 2125  Gross per 24 hour  Intake    600 ml  Output    300 ml  Net    300 ml     LAB RESULTS: CBC  Recent Labs Lab 05/10/14 1130  05/11/14 0320 05/12/14 0304 05/13/14 0215 05/16/14 0547  WBC 14.9* 10.9* 11.4* 12.0* 8.8  HGB 11.2* 11.2* 11.1* 11.6* 14.4  HCT 38.1* 36.0* 37.4* 39.8 45.3  PLT 183 169 170 186 193  MCV 81.8 77.9* 82.0 81.9 77.7*  MCH 24.0* 24.2* 24.3* 23.9* 24.7*  MCHC 29.4* 31.1 29.7* 29.1* 31.8  RDW 17.9* 18.0* 18.7* 18.5* 18.3*  LYMPHSABS 0.4*  --   --   --   --   MONOABS 1.3*  --   --   --   --   EOSABS 0.0  --   --   --   --   BASOSABS 0.0  --   --   --   --     Chemistries   Recent Labs Lab 05/10/14 1309 05/10/14 2159 05/11/14 0320 05/11/14 2128 05/12/14 0304 05/13/14 0215 05/16/14 0547 05/17/14 0527  NA  --  147 145  --  146 144 140 139  K 4.1 3.6* 3.6*  --  4.8 4.6 3.3* 3.4*  CL  --  108 107  --  109 103 95* 98  CO2  --  25 25  --  32 34* 35* 30  GLUCOSE  --  87 110*  --  125* 84 100* 116*  BUN  --  28* 29*  --  22 21 23 16   CREATININE  --  1.07 1.01  --  0.68 0.67 0.63 0.57  CALCIUM  --  8.2* 8.3*  --  8.6 9.0 9.2 8.9  MG 1.5 1.5 1.5 2.7* 2.4  --   --   --     CBG:  Recent Labs Lab 05/16/14 1617 05/16/14 2044 05/17/14 0022 05/17/14 0405 05/17/14 0745  GLUCAP 95 97 103* 109* 119*    GFR Estimated Creatinine Clearance: 99.3 ml/min (by C-G formula based on Cr of 0.57).  Coagulation profile  Recent Labs Lab 05/10/14 1130 05/16/14 0547  INR 1.37 1.11    Cardiac Enzymes  Recent Labs Lab 05/10/14 1130 05/10/14 1615 05/10/14 2159 05/10/14 2230  CKMB  --   --  19.6*  --   TROPONINI 2.41* 7.08*  --  5.51*    No components found with this basename: POCBNP,  No results found for this basename: DDIMER,  in the last 72 hours No results found for this basename: HGBA1C,  in the last 72 hours No results found for this basename: CHOL, HDL, LDLCALC, TRIG, CHOLHDL, LDLDIRECT,  in the last 72 hours No results found for this basename: TSH, T4TOTAL, FREET3, T3FREE, THYROIDAB,  in the last 72 hours No results found for this basename: VITAMINB12, FOLATE, FERRITIN,  TIBC, IRON, RETICCTPCT,  in the last 72 hours No results found for this basename: LIPASE, AMYLASE,  in the last 72 hours  Urine Studies No results found for this basename: UACOL, UAPR, USPG, UPH, UTP, UGL, UKET, UBIL, UHGB, UNIT, UROB, ULEU, UEPI, UWBC, URBC, UBAC, CAST, CRYS, UCOM, BILUA,  in the last 72 hours  MICROBIOLOGY: Recent Results (from the past 240 hour(s))  URINE CULTURE     Status: None   Collection Time    05/10/14  8:17 AM      Result Value Ref Range Status   Specimen Description URINE, CATHETERIZED   Final   Special Requests NONE   Final   Culture  Setup Time     Final   Value: 05/10/2014 12:58     Performed at [place]     Final   Value: NO GROWTH     Performed at [facility]   Culture     Final   Value: NO GROWTH     Performed at [facility]   Report Status 05/11/2014 FINAL   Final  CULTURE, BLOOD (ROUTINE X 2)     Status: None   Collection Time    05/10/14  8:21 AM      Result Value Ref Range Status   Specimen Description BLOOD LEFT WRIST   Final   Special Requests BOTTLES DRAWN AEROBIC ONLY 2MLS   Final   Culture  Setup Time     Final   Value: 05/10/2014 12:55     Performed at [facility]   Culture     Final   Value: NO GROWTH 5 DAYS     Note: Culture results may be compromised due to an inadequate volume of blood received in culture bottles.     Performed at [facility]  Report Status 05/16/2014 FINAL   Final  CULTURE, BLOOD (ROUTINE X 2)     Status: None   Collection Time    05/10/14  8:35 AM      Result Value Ref Range Status   Specimen Description BLOOD RIGHT FOREARM   Final   Special Requests BOTTLES DRAWN AEROBIC ONLY 3MLS   Final   Culture  Setup Time     Final   Value: 05/10/2014 12:54     Performed at [facility]   Culture     Final   Value: NO GROWTH 5 DAYS     Performed at [facility]   Report Status 05/16/2014 FINAL   Final  CULTURE, RESPIRATORY  (NON-EXPECTORATED)     Status: None   Collection Time    05/10/14 11:30 AM      Result Value Ref Range Status   Specimen Description TRACHEAL ASPIRATE   Final   Special Requests NONE   Final   Gram Stain     Final   Value: FEW WBC PRESENT,BOTH PMN AND MONONUCLEAR     RARE SQUAMOUS EPITHELIAL CELLS PRESENT     NO ORGANISMS SEEN     Performed at [facility]   Culture     Final   Value: MODERATE STREPTOCOCCUS,BETA HEMOLYTIC NOT GROUP A     Performed at [facility]   Report Status 05/13/2014 FINAL   Final  MRSA PCR SCREENING     Status: None   Collection Time    05/10/14 12:54 PM      Result Value Ref Range Status   MRSA by PCR NEGATIVE  NEGATIVE Final   Comment:            The GeneXpert MRSA Assay (FDA     approved for NASAL specimens     only), is one component of a     comprehensive MRSA colonization     surveillance program. It is not     intended to diagnose MRSA     infection nor to guide or     monitor treatment for     MRSA infections.    RADIOLOGY STUDIES/RESULTS: Ct Angio Head W/cm &/or Wo Cm  05/10/2014   CLINICAL DATA:  INITIAL ENCOUNTER. CODE STROKE. Patient found unresponsive at 6 o'clock a.m. Last seen well at 1 o'clock a.m.  EXAM: CT ANGIOGRAPHY HEAD AND NECK  TECHNIQUE: Multidetector CT imaging of the head and neck was performed using the standard protocol during bolus administration of intravenous contrast. Multiplanar CT image reconstructions and MIPs were obtained to evaluate the vascular anatomy. Carotid stenosis measurements (when applicable) are obtained utilizing NASCET criteria, using the distal internal carotid diameter as the denominator.  CONTRAST:  61mL OMNIPAQUE IOHEXOL 350 MG/ML SOLN, 44mL OMNIPAQUE IOHEXOL 350 MG/ML SOLN  COMPARISON:  CT head without contrast 05/10/2014.  FINDINGS: CTA HEAD FINDINGS  The source images demonstrate no acute infarct. The basal ganglia are intact. The postcontrast images demonstrate no pathologic  enhancement. The patient is intubated. An NG tube is in place.  Atherosclerotic changes are present within the distal internal carotid arteries bilaterally without significant stenosis. The A1 and M1 segments are normal. The anterior communicating artery is patent. The bifurcations are intact. There is moderate attenuation of distal MCA branch vessels bilaterally.  The distal left vertebral artery is narrowed P on the dura. Right vertebral artery is intact. The left PICA origin is visualized and normal. The right AICA is dominant. The basilar artery  is within normal limits. The left posterior cerebral artery originates from the basilar tip. The right posterior cerebral artery is of fetal type. There is moderate attenuation of PCA branch vessels bilaterally.  Review of the MIP images confirms the above findings.  CTA NECK FINDINGS  A 3 vessel arch configuration is present. Right lateral irregularity and focally ectasia is noted in the proximal innominate artery. The artery measures up to 21 mm in diameter. The proximal vessels are otherwise normal. Atherosclerotic calcifications are present in the aortic arch.  The vertebral artery is slightly dominant to the left. There is focal narrowing of the distal left vertebral artery at the level of C2-2.  The right common carotid artery demonstrates atherosclerotic changes at the bifurcation. There some calcific changes in the proximal right ICA without significant stenosis. The right ICA is tortuous without distal stenosis.  The left common carotid artery is within normal limits. Atherosclerotic changes are noted at the bifurcation and proximal left ICA without significant stenosis. The left ICA is tortuous proximally. The distal left ICA is normal.  The bone windows demonstrate multilevel disc disease with uncovertebral spurring from C3-4 through C6-7. No focal lytic or blastic lesions are present. The lung apices are clear.  Review of the MIP images confirms the above  findings.  IMPRESSION: 1. Atherosclerotic changes at the carotid bifurcations bilaterally without significant stenosis. 2. ICA tortuosity is worse right than left without significant stenosis. 3. Focal narrowing of the distal left vertebral artery at the level of C2 and the dura with a small irregular vessel extending to the vertebrobasilar junction. 4. The basilar artery is intact. 5. Focal ectasia of the innominate artery measuring up to 21 mm just above the aortic arch. 6. Atherosclerotic changes within the cavernous carotid arteries bilaterally without significant stenosis. 7. Moderate distal small vessel disease in the MCA and PCA branch vessels. These results were called by telephone at the time of interpretation on 05/10/2014 at 12:12 pm to Dr. Roland Rack, who verbally acknowledged these results.   Electronically Signed   By: Lawrence Santiago M.D.   On: 05/10/2014 12:44   Ct Head Wo Contrast  05/10/2014   CLINICAL DATA:  Patient unresponsive.  EXAM: CT HEAD WITHOUT CONTRAST  TECHNIQUE: Contiguous axial images were obtained from the base of the skull through the vertex without intravenous contrast.  COMPARISON:  None.  FINDINGS: There is no evidence of acute intracranial abnormality including infarct, hemorrhage, mass lesion, mass effect, midline shift or abnormal extra-axial fluid collection. Mucous retention cysts or polyps are seen in the maxillary sinuses bilaterally. Mastoid air cells and middle ears are clear. The calvarium is intact.  IMPRESSION: No acute intracranial abnormality.  Mucous retention cysts or polyps in the maxillary sinuses.   Electronically Signed   By: Inge Rise M.D.   On: 05/10/2014 09:04   Ct Soft Tissue Neck W Contrast  05/15/2014   CLINICAL DATA:  58 year old male with 2 months of hoarseness without pain. No bleeding or hemoptysis. Recent smoker. Recent intubation. Now extubated. Flexible laryngoscopy performed on 05/14/2014 demonstrated visible serration on lower  NG all surface of left area epiglottic fold. Epiglottis with swelling and edema.  EXAM: CT NECK WITH CONTRAST  TECHNIQUE: Multidetector CT imaging of the neck was performed using the standard protocol following the bolus administration of intravenous contrast.  COMPARISON:  Prior CTA of the neck from 05/10/2014  FINDINGS: The visualized portions of the brain and posterior fossa are unremarkable. Orbits within normal limits.  Mild  polypoid opacity present within the maxillary sinuses bilaterally. Paranasal sinuses are otherwise clear. No mastoid effusion.  The salivary glands including the parotid glands and submandibular glands are within normal limits.  Oral cavity is normal in appearance without mass lesion or loculated fluid collection. The patient is edentulous. Palatine tonsils are symmetric bilaterally. Uvula is normal. Parapharyngeal fat is preserved. Nasopharynx and oropharynx within normal limits.  No retropharyngeal fluid collection.  There is mild diffuse epiglottic swelling and edema. Additionally, there is mild edema and swelling along the aryepiglottic folds bilaterally. There is mild irregularity along the left aryepiglottic fold (series 2, image 85), which may reflect the ulceration visualized on recent laryngoscopy. There is fullness of the left piriform sinus without definite submucosal mass lesion. The right piriform sinus is preserved. Additionally, asymmetric fullness in the region of the right pre epiglottic fat and right false vocal cord (series 2, image 81). Evaluation of this area somewhat difficult due to motion artifact. True vocal cords are fairly symmetric inferiorly. Thyroid and cricoid cartilages within normal limits. Subglottic airway is clear.  There is an enlarged right level II 8 node measuring 2 cm in short access (series 2, image 66). Central hypodensity within this note is concerning for necrosis. Adjacent mildly prominent right level II a node measures 8 mm in short axis. While  this node measures within normal limits for size, central hypodensity also worrisome for necrosis. No left-sided cervical adenopathy. No supraclavicular lymph nodes.  Thyroid gland within normal limits.  Visualized portions of the lung apices are clear.  Scattered atheromatous plaque present about the carotid bifurcations and proximal internal carotid arteries, better evaluated on recent CTA.  Multilevel degenerative changes noted within the visualized spine. No worrisome lytic or blastic osseous lesions. No acute osseous abnormality.  IMPRESSION: 1. Edema with swelling within the epiglottis and aryepiglottic folds bilaterally, which may be related to recent intubation. Airway remains widely patent. 2. Subtle asymmetric irregularity along the laryngeal surface of the left aryepiglottic fold, which may reflect the ulcerative lesion seen on flexible laryngoscopy performed earlier on the same day. There is associated fullness within the adjacent left piriform sinus. 3. Apparent fullness and asymmetry within the right pre epiglottic fat extending inferiorly towards the right false vocal cord. Evaluation of this area is somewhat difficult due to motion artifact. Attention at possible future laryngoscopy/biopsy recommended. 4. Enlarged 2.0 cm necrotic right level IIa node, worrisome for nodal metastasis. An adjacent 8 mm node is also worrisome with suggestion of central necrosis, although is within normal limits for size criteria.   Electronically Signed   By: Jeannine Boga M.D.   On: 05/15/2014 07:30   Ct Angio Neck W/cm &/or Wo/cm  05/10/2014   CLINICAL DATA:  INITIAL ENCOUNTER. CODE STROKE. Patient found unresponsive at 6 o'clock a.m. Last seen well at 1 o'clock a.m.  EXAM: CT ANGIOGRAPHY HEAD AND NECK  TECHNIQUE: Multidetector CT imaging of the head and neck was performed using the standard protocol during bolus administration of intravenous contrast. Multiplanar CT image reconstructions and MIPs were  obtained to evaluate the vascular anatomy. Carotid stenosis measurements (when applicable) are obtained utilizing NASCET criteria, using the distal internal carotid diameter as the denominator.  CONTRAST:  75mL OMNIPAQUE IOHEXOL 350 MG/ML SOLN, 38mL OMNIPAQUE IOHEXOL 350 MG/ML SOLN  COMPARISON:  CT head without contrast 05/10/2014.  FINDINGS: CTA HEAD FINDINGS  The source images demonstrate no acute infarct. The basal ganglia are intact. The postcontrast images demonstrate no pathologic enhancement. The patient is intubated.  An NG tube is in place.  Atherosclerotic changes are present within the distal internal carotid arteries bilaterally without significant stenosis. The A1 and M1 segments are normal. The anterior communicating artery is patent. The bifurcations are intact. There is moderate attenuation of distal MCA branch vessels bilaterally.  The distal left vertebral artery is narrowed P on the dura. Right vertebral artery is intact. The left PICA origin is visualized and normal. The right AICA is dominant. The basilar artery is within normal limits. The left posterior cerebral artery originates from the basilar tip. The right posterior cerebral artery is of fetal type. There is moderate attenuation of PCA branch vessels bilaterally.  Review of the MIP images confirms the above findings.  CTA NECK FINDINGS  A 3 vessel arch configuration is present. Right lateral irregularity and focally ectasia is noted in the proximal innominate artery. The artery measures up to 21 mm in diameter. The proximal vessels are otherwise normal. Atherosclerotic calcifications are present in the aortic arch.  The vertebral artery is slightly dominant to the left. There is focal narrowing of the distal left vertebral artery at the level of C2-2.  The right common carotid artery demonstrates atherosclerotic changes at the bifurcation. There some calcific changes in the proximal right ICA without significant stenosis. The right ICA is  tortuous without distal stenosis.  The left common carotid artery is within normal limits. Atherosclerotic changes are noted at the bifurcation and proximal left ICA without significant stenosis. The left ICA is tortuous proximally. The distal left ICA is normal.  The bone windows demonstrate multilevel disc disease with uncovertebral spurring from C3-4 through C6-7. No focal lytic or blastic lesions are present. The lung apices are clear.  Review of the MIP images confirms the above findings.  IMPRESSION: 1. Atherosclerotic changes at the carotid bifurcations bilaterally without significant stenosis. 2. ICA tortuosity is worse right than left without significant stenosis. 3. Focal narrowing of the distal left vertebral artery at the level of C2 and the dura with a small irregular vessel extending to the vertebrobasilar junction. 4. The basilar artery is intact. 5. Focal ectasia of the innominate artery measuring up to 21 mm just above the aortic arch. 6. Atherosclerotic changes within the cavernous carotid arteries bilaterally without significant stenosis. 7. Moderate distal small vessel disease in the MCA and PCA branch vessels. These results were called by telephone at the time of interpretation on 05/10/2014 at 12:12 pm to Dr. Roland Rack, who verbally acknowledged these results.   Electronically Signed   By: Lawrence Santiago M.D.   On: 05/10/2014 12:44   Ct Angio Chest Pe W/cm &/or Wo Cm  05/10/2014   CLINICAL DATA:  Found on couch unresponsive. Shortness of breath for 3-4 days.  EXAM: CT ANGIOGRAPHY CHEST WITH CONTRAST  TECHNIQUE: Multidetector CT imaging of the chest was performed using the standard protocol during bolus administration of intravenous contrast. Multiplanar CT image reconstructions and MIPs were obtained to evaluate the vascular anatomy.  CONTRAST:  4mL OMNIPAQUE IOHEXOL 350 MG/ML SOLN, 4mL OMNIPAQUE IOHEXOL 350 MG/ML SOLN  COMPARISON:  None  FINDINGS: Mediastinum: The heart size  appears normal. No pericardial effusion. The trachea appears patent. There is an endotracheal tube with tip above the carina. Nasogastric tube is identified within the esophagus. There is no mediastinal or hilar adenopathy identified. No supraclavicular or axillary adenopathy. There is calcified atherosclerotic disease involving the thoracic aorta. Calcified plaque is also noted within the LAD and RCA coronary arteries. Normal appearance of the  pulmonary artery. No lobar or segmental filling defects identified to suggest pulmonary embolus.  Lungs/Pleura: There is no pleural effusion identified. Dependent changes noted in the right lung base. No airspace consolidation. Left upper lobe lung nodule measures 8 mm.  Upper Abdomen: Incidental imaging through the upper abdomen is unremarkable.  Musculoskeletal: Review of the visualized bony structures is significant for thoracic kyphosis. There is multi level degenerative disc disease identified. No acute bone abnormality noted.  Review of the MIP images confirms the above findings.  IMPRESSION: 1. Exam is negative for acute pulmonary embolus. 2. Atherosclerotic disease including multi vessel coronary artery calcification. 3. Left lung nodule measures 8 mm If the patient is at high risk for bronchogenic carcinoma, follow-up chest CT at 3-13months is recommended. If the patient is at low risk for bronchogenic carcinoma, follow-up chest CT at 6-12 months is recommended. This recommendation follows the consensus statement: Guidelines for Management of Small Pulmonary Nodules Detected on CT Scans: A Statement from the [place] as published in Radiology 2005; 237:395-400.   Electronically Signed   By: Kerby Moors M.D.   On: 05/10/2014 12:29   Mr Brain Wo Contrast  05/12/2014   CLINICAL DATA:  58 year old male last seen normal at 1 a.m. on 05/10/2014. Found unresponsive. On admission was unresponsive with bradycardia, and atrial fibrillation. No known past medical  history. Improving altered mental status.  EXAM: MRI HEAD WITHOUT CONTRAST  TECHNIQUE: Multiplanar, multiecho pulse sequences of the brain and surrounding structures were obtained without intravenous contrast.  COMPARISON:  CT head 05/10/2014.  CT angio head and neck 05/10/2014.  FINDINGS: Motion degraded exam.  Overall study diagnostic.  Widespread areas of restricted diffusion affecting primarily the BILATERAL globus pallidus, other deep nuclei, and periventricular white matter, predominantly supratentorial, but with involvement of the cerebellum, consistent with a hypoxic/ischemic and/or hypotensive insult. Carbon monoxide poisoning can also have this appearance, but given the punctate deep white matter areas of restricted diffusion, is less favored. No hemorrhage, mass lesion, hydrocephalus, or extra-axial fluid. Premature for age atrophy. Mild subcortical and periventricular T2 and FLAIR hyperintensities, likely chronic microvascular ischemic change. 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 Flow voids are maintained in the carotid, basilar, and vertebral arteries. There is no obvious head trauma or osseous lesion. Upper cervical region unremarkable.  Compared with prior CT studies, the these changes are not visible.  IMPRESSION: Widespread areas of restricted diffusion involving both the deep nuclei (predominately globus pallidus) as well as the deep white matter in a watershed pattern. Findings are consistent with a hypoxic/ischemic and/or hypertensive insult. Carbon monoxide poisoning has some similar imaging features; see discussion above.   Electronically Signed   By: Rolla Flatten M.D.   On: 05/12/2014 10:46   Nm Myocar Multi W/spect W/wall Motion / Ef  05/15/2014   CLINICAL DATA:  58 year old male recently admitted with respiratory failure, aspiration pneumonia and septic shock. Additionally, had atrial  fibrillation with rapid ventricular response and elevated troponins thought to be related O2 demand ischemia from the AFib with RVR. Nuclear medicine cardiac stress test warranted to evaluate for reversible ischemia.  EXAM: MYOCARDIAL IMAGING WITH SPECT (REST AND PHARMACOLOGIC-STRESS)  GATED LEFT VENTRICULAR WALL MOTION STUDY  LEFT VENTRICULAR EJECTION FRACTION  TECHNIQUE: Standard myocardial SPECT imaging was performed after resting intravenous injection of 10 mCi Tc-56m sestamibi. Subsequently, intravenous infusion of Lexiscan was performed under the supervision of the Cardiology staff. At peak effect of  the drug, 30 mCi Tc-51m sestamibi was injected intravenously and standard myocardial SPECT imaging was performed. Quantitative gated imaging was also performed to evaluate left ventricular wall motion, and estimate left ventricular ejection fraction.  COMPARISON:  Chest x-ray 05/11/2014  FINDINGS: Perfusion: No decreased activity in the left ventricle on stress imaging to suggest reversible ischemia or infarction. Mildly decreased radiotracer uptake in the inferior wall on both the resting and stress images likely represents diaphragmatic attenuation in a male patient.  Wall Motion: Normal left ventricular wall motion. No left ventricular dilation.  Left Ventricular Ejection Fraction: 61 %  End diastolic volume 76 ml  End systolic volume 29 ml  IMPRESSION: 1. No reversible ischemia or infarction.  2. Normal left ventricular wall motion.  3. Left ventricular ejection fraction 61%  4. Low-risk stress test findings*.  *2012 Appropriate Use Criteria for Coronary Revascularization Focused Update: J Am Coll Cardiol. 8338;25(0):539-767. http://content.airportbarriers.com.aspx?articleid=1201161   Electronically Signed   By: Jacqulynn Cadet M.D.   On: 05/15/2014 11:15   Dg Chest Port 1 View  05/11/2014   CLINICAL DATA:  Acute respiratory failure with difficult intubation, septic shock and aspiration pneumonia   EXAM: PORTABLE CHEST - 1 VIEW  COMPARISON:  05/10/2014  FINDINGS: The cardiac shadow is mildly enlarged but stable. An endotracheal tube is noted 2.6 cm above the carina. A nasogastric catheter and left jugular central line are again seen and stable. The lungs are well aerated with minimal bibasilar atelectatic changes. No focal confluent infiltrate is seen. No pneumothorax is noted. No bony abnormality is seen.  IMPRESSION: Tubes and lines as described above.  Bibasilar atelectatic changes.   Electronically Signed   By: Inez Catalina M.D.   On: 05/11/2014 08:02   Dg Chest Port 1 View  05/10/2014   CLINICAL DATA:  58 year old male found unresponsive in home by roommate at 0630 hr, bradycardia on EMS arrival, intubated. Initial encounter.  EXAM: PORTABLE CHEST - 1 VIEW  COMPARISON:  None.  FINDINGS: Portable AP supine view at 0818 hr. Endotracheal tube in good position at the level the clavicles. Enteric tube courses to the abdomen, side hole the level of the gastric body.  Mildly low lung volumes. Normal cardiac size and mediastinal contours. Visualized tracheal air column is within normal limits. No pneumothorax, pulmonary edema, pleural effusion or confluent pulmonary opacity.  IMPRESSION: 1. ET tube and enteric tube in good position. 2. Mildly low lung volumes.  No acute cardiopulmonary abnormality.   Electronically Signed   By: Lars Pinks M.D.   On: 05/10/2014 08:34   Dg Chest Port 1v Same Day  05/10/2014   CLINICAL DATA:  Central catheter placement ; hypoxia  EXAM: PORTABLE CHEST - 1 VIEW SAME DAY  COMPARISON:  Study obtained earlier in the day  FINDINGS: Central catheter tip is in the left innominate vein. Endotracheal tube tip is 4.1 cm above the carina. Nasogastric tube tip and side port are in the stomach. No pneumothorax. There is no edema or consolidation. Heart is upper normal in size with pulmonary vascularity within normal limits. Thoracolumbar levoscoliosis is stable.  IMPRESSION: New central  catheter tip is in the left innominate vein. Tube positions as described. No pneumothorax. No lung edema or consolidation.   Electronically Signed   By: Lowella Grip M.D.   On: 05/10/2014 10:48    Oren Binet, MD  [facility] Pager:[phone]  If 7PM-7AM, please contact night-coverage [email or web address] Password [email or web address] 05/17/2014, 9:22 AM   LOS: 7 days

## 2014-05-17 NOTE — Procedures (Signed)
Successful rt ij power port Tip svc/ra No comp Stable Ready for use

## 2014-05-18 DIAGNOSIS — C760 Malignant neoplasm of head, face and neck: Secondary | ICD-10-CM

## 2014-05-18 LAB — GLUCOSE, CAPILLARY
GLUCOSE-CAPILLARY: 113 mg/dL — AB (ref 70–99)
Glucose-Capillary: 122 mg/dL — ABNORMAL HIGH (ref 70–99)
Glucose-Capillary: 121 mg/dL — ABNORMAL HIGH (ref 70–99)
Glucose-Capillary: 79 mg/dL (ref 70–99)
Glucose-Capillary: 149 mg/dL — ABNORMAL HIGH (ref 70–99)

## 2014-05-18 MED ORDER — JEVITY 1.2 CAL PO LIQD: 1000.0000 mL | ORAL | Status: DC

## 2014-05-18 MED ORDER — OSMOLITE 1.2 CAL PO LIQD
360.0000 mL | Freq: Every day | ORAL | Status: DC
  Administered 2014-05-18: 120 mL
  Administered 2014-05-18: 237 mL
  Administered 2014-05-18: 120 mL
  Administered 2014-05-18: 237 mL
  Administered 2014-05-19 (×2): 360 mL
  Filled 2014-05-18 (×15): qty 474

## 2014-05-18 NOTE — Progress Notes (Signed)
Speech Language Pathology Treatment: Dysphagia  Patient Details Name: Donald Hughes MRN: [id number] DOB: [birth year] Today's Date: 05/18/2014 Time: 6578-4696 SLP Time Calculation (min): 40 min  Assessment / Plan / Recommendation Clinical Impression  Pt remains very motivated to participate in therapies, and reports that he has been doing all of his recommended pharyngeal strengthening exercises. He states his understanding of his upcoming course of treatment to include chemo/XRT. SLP initiated pre-radiation counseling as related to swallowing, including potential impact on swallowing, xerostomia, and the need for continued utilization of the hyolaryngeal musculature leading up to treatment and throughout its duration. Pt performed oral care via suctioning prior to consuming a few ice chips with one cough noted. Recommend that pt have a few ice chips after thorough oral care while he is performing his exercise to facilitate use of his swallowing musculature.   HPI HPI: 58 yo male adm to [facility] after being found unresponsive, he required intubation in the ED and intubation was difficult per notes.   Difficult extubation ? due to COPD per MD note.  Per MRI 10/3, pt with widespread restricted diffusion predominately in globus pallidus that may be consistent with hypoxia, ischemic event.  CXR negative for acute changes 05/12/14.     Pertinent Vitals Pain Assessment: 0-10 Pain Score: 2  Pain Location: throat Pain Descriptors / Indicators: Aching Pain Intervention(s): Monitored during session  SLP Plan  Continue with current plan of care    Recommendations Diet recommendations: NPO;Other(comment) (few ice chips PRN after oral care) Medication Administration: Via alternative means              Oral Care Recommendations: Oral care Q4 per protocol;Oral care prior to ice chips Follow up Recommendations: Outpatient SLP Plan: Continue with current plan of care    GO      Donald Hughes, [name]  CCC-SLP [phone]  Donald Hughes 05/18/2014, 3:51 PM

## 2014-05-18 NOTE — Progress Notes (Addendum)
NUTRITION FOLLOW-UP  INTERVENTION:  For home TF regimen, recommend 1.5 cans Osmolite 1.2- 5 times per day. Start with 1/2 a can x 2 feedings, increase to 1 can x 2 feedings, then increase to goal of 1.5 cans 5 times daily. TF regimen provides 2138 kcal and 99 g of protein to meet 100% of estimated needs.   Free water flushes:  - Recommend 150 ml of free water after each feeding to provide additional 750 ml of fluid.   NUTRITION DIAGNOSIS: Inadequate oral intake related to inability to eat as evidenced by NPO status; ongoing  Goal: Pt to meet >/= 90% of their estimated nutrition needs; not met  Monitor:  TF initiation and tolerance, labs, weight trends  58 y.o. male  Admitting Dx: Andre Lefort  ASSESSMENT: Pt with unknown medical hx found unresponsive by roommate. Pt intubated due to AMS and possible COPD with acute exacerbation. Work up ongoing.   10/2- Pt extubated.  10/6-Per SLP, diet recommendations are NPO.  10/7- Pt underwent cervical node biopsy this AM. Pt remains NPO. 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 Per MD note, pt likely will need PEG tube placement prior to discharge. RD with TF recommendations made if/when PEG tube is placed and stable for use. Will continue to monitor.  10/9- s/p PEG placement on 10/8. Ready to use this am. Received consult for TF management.   Height: Ht Readings from Last 1 Encounters:  05/13/14 _0  (1.651 m)    Weight: Wt Readings from Last 1 Encounters:  05/18/14 177 lb 4.8 oz (80.423 kg)   BMI:  Body mass index is 29.5 kg/(m^2).  Re-Estimated Nutritional Needs: Kcal: 1950-2150 Protein: 93-103 grams Fluid: > 1.9 L/day  Skin: WDL  Diet Order: NPO    Intake/Output Summary (Last 24 hours) at 05/18/14 0921 Last data filed at 05/18/14 0454  Gross per 24 hour  Intake   1415 ml  Output   1375 ml  Net     40 ml    Last BM: 10/5  Labs:   Recent Labs Lab 05/11/14 2128  05/12/14 0304 05/13/14 0215 05/16/14 0547  05/17/14 0527  NA  --   < > 146 144 140 139  K  --   < > 4.8 4.6 3.3* 3.4*  CL  --   < > 109 103 95* 98  CO2  --   < > 32 34* 35* 30  BUN  --   < > _1 CREATININE  --   < > 0.68 0.67 0.63 0.57  CALCIUM  --   < > 8.6 9.0 9.2 8.9  MG 2.7*  --  2.4  --   --   --   PHOS  --   --  3.3  --   --   --   GLUCOSE  --   < > 125* 84 100* 116*  < > = values in this interval not displayed.  CBG (last 3)   Recent Labs  05/17/14 2358 05/18/14 0403 05/18/14 0745  GLUCAP 114* 122* 113*    Scheduled Meds: . antiseptic oral rinse  7 mL Mouth Rinse QID  . aspirin  150 mg Rectal Daily  . chlorhexidine  15 mL Mouth Rinse BID  . folic acid  1 mg Intravenous Daily  . heparin subcutaneous  5,000 Units Subcutaneous 3 times per day  . Influenza vac split quadrivalent PF  0.5 mL Intramuscular Tomorrow-1000  . insulin aspart  0-15 Units Subcutaneous 6 times per  day  . lidocaine  15 mL Mouth/Throat Once  . lisinopril  10 mg Oral Daily  . thiamine IV  100 mg Intravenous Daily    Continuous Infusions: . dextrose 5 % and 0.45% NaCl 1,000 mL with potassium chloride 20 mEq infusion 75 mL/hr at 05/18/14 0101    Past Medical History  Diagnosis Date  . Difficult intubation 05/10/14    laryngeal edema, difficult DL and video laryngoscopy    History reviewed. No pertinent past surgical history.  Laurette Schimke RD, LDN

## 2014-05-18 NOTE — Progress Notes (Signed)
05/18/2014 8:20 AM  Donald Hughes [id number]     Temp:  [98 F (36.7 C)-98.4 F (36.9 C)] 98.1 F (36.7 C) (10/09 0453) Pulse Rate:  [69-109] 69 (10/09 0453) Resp:  [16-22] 18 (10/09 0453) BP: (100-167)/(69-110) 119/85 mmHg (10/09 0453) SpO2:  [95 %-100 %] 96 % (10/09 0453) Weight:  [80.423 kg (177 lb 4.8 oz)] 80.423 kg (177 lb 4.8 oz) (10/09 0453),     Intake/Output Summary (Last 24 hours) at 05/18/14 0820 Last data filed at 05/18/14 0454  Gross per 24 hour  Intake   1415 ml  Output   1375 ml  Net     40 ml    Results for orders placed during the hospital encounter of 05/10/14 (from the past 24 hour(s))  GLUCOSE, CAPILLARY     Status: Abnormal   Collection Time    05/17/14 11:54 AM      Result Value Ref Range   Glucose-Capillary 112 (*) 70 - 99 mg/dL  GLUCOSE, CAPILLARY     Status: Abnormal   Collection Time    05/17/14  5:02 PM      Result Value Ref Range   Glucose-Capillary 119 (*) 70 - 99 mg/dL  GLUCOSE, CAPILLARY     Status: Abnormal   Collection Time    05/17/14  7:28 PM      Result Value Ref Range   Glucose-Capillary 112 (*) 70 - 99 mg/dL   Comment 1 Notify RN     Comment 2 Documented in Chart    GLUCOSE, CAPILLARY     Status: Abnormal   Collection Time    05/17/14 11:58 PM      Result Value Ref Range   Glucose-Capillary 114 (*) 70 - 99 mg/dL   Comment 1 Notify RN     Comment 2 Documented in Chart    GLUCOSE, CAPILLARY     Status: Abnormal   Collection Time    05/18/14  4:03 AM      Result Value Ref Range   Glucose-Capillary 122 (*) 70 - 99 mg/dL   Comment 1 Notify RN     Comment 2 Documented in Chart    GLUCOSE, CAPILLARY     Status: Abnormal   Collection Time    05/18/14  7:45 AM      Result Value Ref Range   Glucose-Capillary 113 (*) 70 - 99 mg/dL   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  HPV testing not done, but should not be needed.  SUBJECTIVE:  portacath and PEG placed yest.  Tol. Well.    OBJECTIVE:  Min pain.  Still breathing  OK.  IMPRESSION:  Satisfactory check. Airway stable  PLAN:  When G tube working, [place] for transfer to [facility], or home, for planned RT + Chemo.  Will assist further as needed.  Jodi Marble

## 2014-05-18 NOTE — Progress Notes (Signed)
Occupational Therapy Treatment Patient Details Name: Donald Hughes MRN: [id number] DOB: April 21, 1956 Today's Date: 05/18/2014    History of present illness 58 yo male smoker found unresponsive by roommate >> noted to have bradycardia and then A fib with RVR, and intubated for airway protection.VDRF 10/1-10/2    OT comments  Pt doing well with adls and adl mobilty.  On arrival pt required min guard assist to min assist for most adls and adl mobility.  Now, pt is at supervision level and only needs this because he fatigues quickly.  Follow Up Recommendations  No OT follow up;Supervision/Assistance - 24 hour    Equipment Recommendations  Tub/shower seat    Recommendations for Other Services      Precautions / Restrictions Precautions Precautions: Fall Restrictions Weight Bearing Restrictions: No       Mobility Bed Mobility                  Transfers Overall transfer level: Needs assistance Equipment used: None Transfers: Sit to/from Stand Sit to Stand: Supervision         General transfer comment: supervision only for safety    Balance Overall balance assessment: Needs assistance         Standing balance support: Bilateral upper extremity supported;During functional activity Standing balance-Leahy Scale: Good Standing balance comment: stood steadily at sink doing adls for 5 min w/o assist.                   ADL Overall ADL's : Needs assistance/impaired Eating/Feeding: NPO Eating/Feeding Details (indicate cue type and reason): now NPO with Peg tube due to laryngeal CA Grooming: Supervision/safety;Wash/dry hands;Brushing hair Grooming Details (indicate cue type and reason): stood at sink with S         Upper Body Dressing : Set up;Sitting   Lower Body Dressing: Supervision/safety;Sit to/from stand Lower Body Dressing Details (indicate cue type and reason): Pt needed to physcial assist but fatigues quickly. Toilet Transfer:  Supervision/safety;Ambulation Toilet Transfer Details (indicate cue type and reason): no hands on assist needed for pt.  Assist given for moving IV pole Toileting- Clothing Manipulation and Hygiene: Supervision/safety;Sit to/from stand       Functional mobility during ADLs: Supervision/safety General ADL Comments: Pt currently needing S for adl mobilty and adls in standing due to fatigue when standing.      Vision                     Perception     Praxis      Cognition   Behavior During Therapy: [facility] for tasks assessed/performed                    General Comments: Overall pt seemed very functional today.  Answered safety questions correctly and orientation questions correctly.    Extremity/Trunk Assessment               Exercises     Shoulder Instructions       General Comments      Pertinent Vitals/ Pain       Pain Assessment: 0-10 Pain Score: 2  Pain Location: throat Pain Descriptors / Indicators: Aching Pain Intervention(s): Monitored during session  Home Living                                          Prior Functioning/Environment  Frequency Min 2X/week     Progress Toward Goals  OT Goals(current goals can now be found in the care plan section)  Progress towards OT goals: Progressing toward goals  Acute Rehab OT Goals Patient Stated Goal: To get better and back to his normal baseline.  OT Goal Formulation: With patient Time For Goal Achievement: 05/21/14 Potential to Achieve Goals: Good ADL Goals Pt Will Perform Grooming: with modified independence;standing Pt Will Perform Lower Body Bathing: with modified independence;sit to/from stand Pt Will Perform Lower Body Dressing: with modified independence;sit to/from stand Pt Will Transfer to Toilet: with modified independence;ambulating;regular height toilet Pt Will Perform Toileting - Clothing Manipulation and hygiene: with modified  independence;sit to/from stand Pt Will Perform Tub/Shower Transfer: with modified independence;ambulating;shower seat  Plan Discharge plan remains appropriate    Co-evaluation                 End of Session     Activity Tolerance Patient tolerated treatment well   Patient Left in chair;with call bell/phone within reach;with chair alarm set   Nurse Communication Mobility status        Time: 8280-0349 OT Time Calculation (min): 24 min  Charges: OT General Charges $OT Visit: 1 Procedure OT Treatments $Self Care/Home Management : 23-37 mins  Glenford Peers 05/18/2014, 12:22 PM [phone]

## 2014-05-18 NOTE — Progress Notes (Signed)
PATIENT DETAILS Name: Donald Hughes Age: 58 y.o. Sex: male Date of Birth: [birth year] Admit Date: 05/10/2014 Admitting Physician Rush Farmer, MD PCP:No primary provider on file.  Brief summary Patient is a 58 year old male with a history of smoking, obesity, who developed hoarseness of voice a month prior to this admission, admitted on 10/1 as he was found unresponsive by his girlfriend. Patient was intubated, and admitted to the intensive care unit.During his hospital course, he has had episodes of bradycardia, A. Fib with RVR as well.Further workup has demonstrated a abnormal MRI and a possible seizure focus on his EEG. 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 Workup is in progress   Subjective: No major complaints-except sore at porta cath and G tube site  Assessment/Plan: Active Problems:   Respiratory failure, acute -Secondary to failure to protect airway. Intubated on Admission, extubated and transferred to [facility]. Doing well-on room air  Acute encephalopathy - Secondary to hypoxic/ischemic insult.  MRI brain shows widespread areas of restricted diffusion involving both deep nuclei, and deep white matter in a watershed pattern. Mental status has improved, appreciate neurology consultation.Per neurology, patient may have some long-term cognitive issues. He has developed a tremor, likely post ischemic.  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 Spoke with Oncology-patient will be followed by Oncology  group in [place], Dr Alvy Bimler will arrange.  G tube and Porta cath placed on 05/18/14. Defer timing of tracheostomy to ENT.  Elevated troponins -secondary to demand ischemia -Seen by cardiology, nuclear stress test negative for ischemia.  Sinus bradycardia and atrial Tachycardia - Cardiology following, EP appreciated -Reviewed cardiology notes-with newly diagnosed malignancy-not a candidate for anticoagulation. Currently on aspirin.  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 Encephalopathy - Speech therapy following, modified barium swallow done on 05/14/14-continue n.p.o. Status.G Tube placed on 10/8-will start peg feeds-consult Nutrition.     AKI, anion gap acidosis - resolved.  Disposition: Remain inpatient  DVT Prophylaxis: Prophylactic Heparin   Code Status: Full code   Family Communication None at bedside  Procedures:  None  CONSULTS:  pulmonary/intensive care, hematology/oncology and IR,Neuro and ENT   MEDICATIONS: Scheduled Meds: . antiseptic oral rinse  7 mL Mouth Rinse QID  . aspirin  150 mg Rectal Daily  . chlorhexidine  15 mL Mouth Rinse BID  . folic acid  1 mg Intravenous Daily  . heparin subcutaneous  5,000 Units Subcutaneous 3 times per day  . Influenza vac split quadrivalent PF  0.5 mL Intramuscular Tomorrow-1000  . insulin aspart  0-15 Units Subcutaneous 6 times per day  . lidocaine  15 mL Mouth/Throat Once  . lisinopril  10 mg Oral Daily  . thiamine IV  100 mg Intravenous Daily   Continuous Infusions: . dextrose 5 % and 0.45% NaCl 1,000 mL with potassium chloride 20 mEq infusion 75 mL/hr at 05/18/14 0101   PRN Meds:.hydrALAZINE, ipratropium-albuterol, labetalol  Antibiotics: Anti-infectives   Start     Dose/Rate Route Frequency Ordered Stop   05/17/14 1300  ceFAZolin (ANCEF) IVPB 2 g/50 mL premix  Status:  Discontinued     2 g  100 mL/hr over 30 Minutes Intravenous On call 05/17/14 0856 05/17/14 1027   05/17/14 1300  vancomycin (VANCOCIN)  IVPB 1000 mg/200 mL premix     1,000 mg 200 mL/hr over 60 Minutes Intravenous On call 05/17/14 1027 05/17/14 1445   05/17/14 1300  ciprofloxacin (CIPRO) IVPB 400 mg     400 mg 200 mL/hr over 60 Minutes Intravenous On call 05/17/14 1027 05/17/14 1615   05/11/14 0000  vancomycin (VANCOCIN) IVPB 1000 mg/200 mL premix  Status:  Discontinued     1,000 mg 200 mL/hr over 60 Minutes Intravenous Every 12 hours 05/10/14 1050 05/12/14 1430   05/10/14 2000  piperacillin-tazobactam (ZOSYN) IVPB 3.375 g  Status:  Discontinued     3.375 g 12.5 mL/hr over 240 Minutes Intravenous Every 8 hours 05/10/14 1050 05/12/14 1430   05/10/14 0930  piperacillin-tazobactam (ZOSYN) IVPB 3.375 g  Status:  Discontinued     3.375 g 100 mL/hr over 30 Minutes Intravenous  Once 05/10/14 0917 05/12/14 1430   05/10/14 0915  vancomycin (VANCOCIN) 1,500 mg in sodium chloride 0.9 % 500 mL IVPB     1,500 mg 250 mL/hr over 120 Minutes Intravenous  Once 05/10/14 0911 05/10/14 1246   05/10/14 0915  piperacillin-tazobactam (ZOSYN) IVPB 2.25 g  Status:  Discontinued     2.25 g 100 mL/hr over 30 Minutes Intravenous  Once 05/10/14 0911 05/10/14 0916       PHYSICAL EXAM: Vital signs in last 24 hours: Filed Vitals:   05/17/14 1600 05/17/14 1605 05/17/14 2111 05/18/14 0453  BP: 117/83 130/83 100/71 119/85  Pulse: 86 90 77 69  Temp:   98 F (36.7 C) 98.1 F (36.7 C)  TempSrc:   Oral Oral  Resp: 17 18 18 18   Height:      Weight:    80.423 kg (177 lb 4.8 oz)  SpO2: 99% 98% 95% 96%    Weight change: 2.359 kg (5 lb 3.2 oz) Filed Weights   05/16/14 0500 05/17/14 0226 05/18/14 0453  Weight: 78.155 kg (172 lb 4.8 oz) 78.064 kg (172 lb 1.6 oz) 80.423 kg (177 lb 4.8 oz)   Body mass index is 29.5 kg/(m^2).   Gen Exam: Awake and alert with clear speech.   Neck: Supple, No JVD.   Chest: B/L Clear. No stridor. CVS: S1 S2 Regular, no murmurs.  Abdomen: soft, BS +, non tender, non distended.  Extremities: no edema, lower  extremities warm to touch. Neurologic: Non Focal.   Skin: No Rash.   Wounds: N/A.    Intake/Output from previous day:  Intake/Output Summary (Last 24 hours) at 05/18/14 0846 Last data filed at 05/18/14 0454  Gross per 24 hour  Intake   1415 ml  Output   1375 ml  Net     40 ml     LAB RESULTS: CBC  Recent Labs Lab 05/12/14 0304 05/13/14 0215 05/16/14 0547  WBC 11.4* 12.0* 8.8  HGB 11.1* 11.6* 14.4  HCT 37.4* 39.8 45.3  PLT 170 186 193  MCV 82.0 81.9 77.7*  MCH 24.3* 23.9* 24.7*  MCHC 29.7* 29.1* 31.8  RDW 18.7* 18.5* 18.3*    Chemistries   Recent Labs Lab 05/11/14 2128 05/12/14 0304 05/13/14 0215 05/16/14 0547 05/17/14 0527  NA  --  146 144 140 139  K  --  4.8 4.6 3.3* 3.4*  CL  --  109 103 95* 98  CO2  --  32 34* 35* 30  GLUCOSE  --  125* 84 100* 116*  BUN  --  22 21 23 16   CREATININE  --  0.68 0.67 0.63 0.57  CALCIUM  --  8.6 9.0 9.2 8.9  MG 2.7* 2.4  --   --   --     CBG:  Recent Labs Lab 05/17/14 1702 05/17/14 1928 05/17/14 2358 05/18/14 0403 05/18/14 0745  GLUCAP 119* 112* 114* 122* 113*    GFR Estimated Creatinine Clearance: 100.8 ml/min (by C-G formula based on Cr of 0.57).  Coagulation profile  Recent Labs Lab 05/16/14 0547  INR 1.11    Cardiac Enzymes No results found for this basename: CK, CKMB, TROPONINI, MYOGLOBIN,  in the last 168 hours  No components found with this basename: POCBNP,  No results found for this basename: DDIMER,  in the last 72 hours No results found for this basename: HGBA1C,  in the last 72 hours No results found for this basename: CHOL, HDL, LDLCALC, TRIG, CHOLHDL, LDLDIRECT,  in the last 72 hours No results found for this basename: TSH, T4TOTAL, FREET3, T3FREE, THYROIDAB,  in the last 72 hours No results found for this basename: VITAMINB12, FOLATE, FERRITIN, TIBC, IRON, RETICCTPCT,  in the last 72 hours No results found for this basename: LIPASE, AMYLASE,  in the last 72 hours  Urine Studies No  results found for this basename: UACOL, UAPR, USPG, UPH, UTP, UGL, UKET, UBIL, UHGB, UNIT, UROB, ULEU, UEPI, UWBC, URBC, UBAC, CAST, CRYS, UCOM, BILUA,  in the last 72 hours  MICROBIOLOGY: Recent Results (from the past 240 hour(s))  URINE CULTURE     Status: None   Collection Time    05/10/14  8:17 AM      Result Value Ref Range Status   Specimen Description URINE, CATHETERIZED   Final   Special Requests NONE   Final   Culture  Setup Time     Final   Value: 05/10/2014 12:58     Performed at [facility]     Final   Value: NO GROWTH     Performed at [facility]   Culture     Final   Value: NO GROWTH     Performed at [facility]   Report Status 05/11/2014 FINAL   Final  CULTURE, BLOOD (ROUTINE X 2)     Status: None   Collection Time    05/10/14  8:21 AM      Result Value Ref Range Status   Specimen Description BLOOD LEFT WRIST   Final   Special Requests BOTTLES DRAWN AEROBIC ONLY 2MLS   Final   Culture  Setup Time     Final   Value: 05/10/2014 12:55     Performed at [facility]   Culture     Final   Value: NO GROWTH 5 DAYS     Note: Culture results may be compromised due to an inadequate volume of blood received in culture bottles.     Performed at [facility]   Report Status 05/16/2014 FINAL   Final  CULTURE, BLOOD (ROUTINE X 2)     Status: None   Collection Time    05/10/14  8:35 AM      Result Value Ref Range Status   Specimen Description BLOOD RIGHT FOREARM   Final   Special Requests BOTTLES DRAWN AEROBIC ONLY 3MLS   Final   Culture  Setup Time     Final   Value: 05/10/2014 12:54     Performed at [facility]   Culture     Final   Value: NO GROWTH 5 DAYS     Performed at [facility]   Report Status 05/16/2014 FINAL   Final  CULTURE, RESPIRATORY (NON-EXPECTORATED)     Status: None   Collection Time    05/10/14 11:30 AM      Result Value Ref Range Status   Specimen Description TRACHEAL  ASPIRATE   Final   Special Requests NONE   Final   Gram Stain     Final   Value: FEW WBC PRESENT,BOTH PMN AND MONONUCLEAR     RARE SQUAMOUS EPITHELIAL CELLS PRESENT     NO ORGANISMS SEEN     Performed at [facility]   Culture     Final   Value: MODERATE STREPTOCOCCUS,BETA HEMOLYTIC NOT GROUP A     Performed at [facility]   Report Status 05/13/2014 FINAL   Final  MRSA PCR SCREENING     Status: None   Collection Time    05/10/14 12:54 PM      Result Value Ref Range Status   MRSA by PCR NEGATIVE  NEGATIVE Final   Comment:            The GeneXpert MRSA Assay (FDA     approved for NASAL specimens     only), is one component of a     comprehensive MRSA colonization     surveillance program. It is not     intended to diagnose MRSA     infection nor to guide or     monitor treatment for     MRSA infections.    RADIOLOGY STUDIES/RESULTS: Ct Angio Head W/cm &/or Wo Cm  05/10/2014   CLINICAL DATA:  INITIAL ENCOUNTER. CODE STROKE. Patient found unresponsive at 6 o'clock a.m. Last seen well at 1 o'clock a.m.  EXAM: CT ANGIOGRAPHY HEAD AND NECK  TECHNIQUE: Multidetector CT imaging of the head and neck was performed using the standard protocol during bolus administration of intravenous contrast. Multiplanar CT image reconstructions and MIPs were obtained to evaluate the vascular anatomy. Carotid stenosis measurements (when applicable) are obtained utilizing NASCET criteria, using the distal internal carotid diameter as the denominator.  CONTRAST:  66mL OMNIPAQUE IOHEXOL 350 MG/ML SOLN, 63mL OMNIPAQUE IOHEXOL 350 MG/ML SOLN  COMPARISON:  CT head without contrast 05/10/2014.  FINDINGS: CTA HEAD FINDINGS  The source images demonstrate no acute infarct. The basal ganglia are intact. The postcontrast images demonstrate no pathologic enhancement. The patient is intubated. An NG tube is in place.  Atherosclerotic changes are present within the distal internal carotid arteries bilaterally  without significant stenosis. The A1 and M1 segments are normal. The anterior communicating artery is patent. The bifurcations are intact. There is moderate attenuation of distal MCA branch vessels bilaterally.  The distal left vertebral artery is narrowed P on the dura. Right vertebral artery is intact. The left PICA origin is visualized and normal. The right AICA is dominant. The basilar artery is within normal limits. The left posterior cerebral artery originates from the basilar tip. The right posterior cerebral artery is of fetal type. There is moderate attenuation of PCA branch vessels bilaterally.  Review of the MIP images confirms the above findings.  CTA NECK FINDINGS  A 3 vessel arch configuration is present. Right lateral irregularity and focally ectasia is noted in the proximal innominate artery. The artery measures up to 21 mm in diameter. The proximal vessels are otherwise normal. Atherosclerotic calcifications are present  in the aortic arch.  The vertebral artery is slightly dominant to the left. There is focal narrowing of the distal left vertebral artery at the level of C2-2.  The right common carotid artery demonstrates atherosclerotic changes at the bifurcation. There some calcific changes in the proximal right ICA without significant stenosis. The right ICA is tortuous without distal stenosis.  The left common carotid artery is within normal limits. Atherosclerotic changes are noted at the bifurcation and proximal left ICA without significant stenosis. The left ICA is tortuous proximally. The distal left ICA is normal.  The bone windows demonstrate multilevel disc disease with uncovertebral spurring from C3-4 through C6-7. No focal lytic or blastic lesions are present. The lung apices are clear.  Review of the MIP images confirms the above findings.  IMPRESSION: 1. Atherosclerotic changes at the carotid bifurcations bilaterally without significant stenosis. 2. ICA tortuosity is worse right than  left without significant stenosis. 3. Focal narrowing of the distal left vertebral artery at the level of C2 and the dura with a small irregular vessel extending to the vertebrobasilar junction. 4. The basilar artery is intact. 5. Focal ectasia of the innominate artery measuring up to 21 mm just above the aortic arch. 6. Atherosclerotic changes within the cavernous carotid arteries bilaterally without significant stenosis. 7. Moderate distal small vessel disease in the MCA and PCA branch vessels. These results were called by telephone at the time of interpretation on 05/10/2014 at 12:12 pm to Dr. Roland Rack, who verbally acknowledged these results.   Electronically Signed   By: Lawrence Santiago M.D.   On: 05/10/2014 12:44   Ct Head Wo Contrast  05/10/2014   CLINICAL DATA:  Patient unresponsive.  EXAM: CT HEAD WITHOUT CONTRAST  TECHNIQUE: Contiguous axial images were obtained from the base of the skull through the vertex without intravenous contrast.  COMPARISON:  None.  FINDINGS: There is no evidence of acute intracranial abnormality including infarct, hemorrhage, mass lesion, mass effect, midline shift or abnormal extra-axial fluid collection. Mucous retention cysts or polyps are seen in the maxillary sinuses bilaterally. Mastoid air cells and middle ears are clear. The calvarium is intact.  IMPRESSION: No acute intracranial abnormality.  Mucous retention cysts or polyps in the maxillary sinuses.   Electronically Signed   By: Inge Rise M.D.   On: 05/10/2014 09:04   Ct Soft Tissue Neck W Contrast  05/15/2014   CLINICAL DATA:  58 year old male with 2 months of hoarseness without pain. No bleeding or hemoptysis. Recent smoker. Recent intubation. Now extubated. Flexible laryngoscopy performed on 05/14/2014 demonstrated visible serration on lower NG all surface of left area epiglottic fold. Epiglottis with swelling and edema.  EXAM: CT NECK WITH CONTRAST  TECHNIQUE: Multidetector CT imaging of the  neck was performed using the standard protocol following the bolus administration of intravenous contrast.  COMPARISON:  Prior CTA of the neck from 05/10/2014  FINDINGS: The visualized portions of the brain and posterior fossa are unremarkable. Orbits within normal limits.  Mild polypoid opacity present within the maxillary sinuses bilaterally. Paranasal sinuses are otherwise clear. No mastoid effusion.  The salivary glands including the parotid glands and submandibular glands are within normal limits.  Oral cavity is normal in appearance without mass lesion or loculated fluid collection. The patient is edentulous. Palatine tonsils are symmetric bilaterally. Uvula is normal. Parapharyngeal fat is preserved. Nasopharynx and oropharynx within normal limits.  No retropharyngeal fluid collection.  There is mild diffuse epiglottic swelling and edema. Additionally, there is mild edema  and swelling along the aryepiglottic folds bilaterally. There is mild irregularity along the left aryepiglottic fold (series 2, image 85), which may reflect the ulceration visualized on recent laryngoscopy. There is fullness of the left piriform sinus without definite submucosal mass lesion. The right piriform sinus is preserved. Additionally, asymmetric fullness in the region of the right pre epiglottic fat and right false vocal cord (series 2, image 81). Evaluation of this area somewhat difficult due to motion artifact. True vocal cords are fairly symmetric inferiorly. Thyroid and cricoid cartilages within normal limits. Subglottic airway is clear.  There is an enlarged right level II 8 node measuring 2 cm in short access (series 2, image 66). Central hypodensity within this note is concerning for necrosis. Adjacent mildly prominent right level II a node measures 8 mm in short axis. While this node measures within normal limits for size, central hypodensity also worrisome for necrosis. No left-sided cervical adenopathy. No supraclavicular  lymph nodes.  Thyroid gland within normal limits.  Visualized portions of the lung apices are clear.  Scattered atheromatous plaque present about the carotid bifurcations and proximal internal carotid arteries, better evaluated on recent CTA.  Multilevel degenerative changes noted within the visualized spine. No worrisome lytic or blastic osseous lesions. No acute osseous abnormality.  IMPRESSION: 1. Edema with swelling within the epiglottis and aryepiglottic folds bilaterally, which may be related to recent intubation. Airway remains widely patent. 2. Subtle asymmetric irregularity along the laryngeal surface of the left aryepiglottic fold, which may reflect the ulcerative lesion seen on flexible laryngoscopy performed earlier on the same day. There is associated fullness within the adjacent left piriform sinus. 3. Apparent fullness and asymmetry within the right pre epiglottic fat extending inferiorly towards the right false vocal cord. Evaluation of this area is somewhat difficult due to motion artifact. Attention at possible future laryngoscopy/biopsy recommended. 4. Enlarged 2.0 cm necrotic right level IIa node, worrisome for nodal metastasis. An adjacent 8 mm node is also worrisome with suggestion of central necrosis, although is within normal limits for size criteria.   Electronically Signed   By: Jeannine Boga M.D.   On: 05/15/2014 07:30   Ct Angio Neck W/cm &/or Wo/cm  05/10/2014   CLINICAL DATA:  INITIAL ENCOUNTER. CODE STROKE. Patient found unresponsive at 6 o'clock a.m. Last seen well at 1 o'clock a.m.  EXAM: CT ANGIOGRAPHY HEAD AND NECK  TECHNIQUE: Multidetector CT imaging of the head and neck was performed using the standard protocol during bolus administration of intravenous contrast. Multiplanar CT image reconstructions and MIPs were obtained to evaluate the vascular anatomy. Carotid stenosis measurements (when applicable) are obtained utilizing NASCET criteria, using the distal internal  carotid diameter as the denominator.  CONTRAST:  29mL OMNIPAQUE IOHEXOL 350 MG/ML SOLN, 31mL OMNIPAQUE IOHEXOL 350 MG/ML SOLN  COMPARISON:  CT head without contrast 05/10/2014.  FINDINGS: CTA HEAD FINDINGS  The source images demonstrate no acute infarct. The basal ganglia are intact. The postcontrast images demonstrate no pathologic enhancement. The patient is intubated. An NG tube is in place.  Atherosclerotic changes are present within the distal internal carotid arteries bilaterally without significant stenosis. The A1 and M1 segments are normal. The anterior communicating artery is patent. The bifurcations are intact. There is moderate attenuation of distal MCA branch vessels bilaterally.  The distal left vertebral artery is narrowed P on the dura. Right vertebral artery is intact. The left PICA origin is visualized and normal. The right AICA is dominant. The basilar artery is within normal limits. The  left posterior cerebral artery originates from the basilar tip. The right posterior cerebral artery is of fetal type. There is moderate attenuation of PCA branch vessels bilaterally.  Review of the MIP images confirms the above findings.  CTA NECK FINDINGS  A 3 vessel arch configuration is present. Right lateral irregularity and focally ectasia is noted in the proximal innominate artery. The artery measures up to 21 mm in diameter. The proximal vessels are otherwise normal. Atherosclerotic calcifications are present in the aortic arch.  The vertebral artery is slightly dominant to the left. There is focal narrowing of the distal left vertebral artery at the level of C2-2.  The right common carotid artery demonstrates atherosclerotic changes at the bifurcation. There some calcific changes in the proximal right ICA without significant stenosis. The right ICA is tortuous without distal stenosis.  The left common carotid artery is within normal limits. Atherosclerotic changes are noted at the bifurcation and proximal  left ICA without significant stenosis. The left ICA is tortuous proximally. The distal left ICA is normal.  The bone windows demonstrate multilevel disc disease with uncovertebral spurring from C3-4 through C6-7. No focal lytic or blastic lesions are present. The lung apices are clear.  Review of the MIP images confirms the above findings.  IMPRESSION: 1. Atherosclerotic changes at the carotid bifurcations bilaterally without significant stenosis. 2. ICA tortuosity is worse right than left without significant stenosis. 3. Focal narrowing of the distal left vertebral artery at the level of C2 and the dura with a small irregular vessel extending to the vertebrobasilar junction. 4. The basilar artery is intact. 5. Focal ectasia of the innominate artery measuring up to 21 mm just above the aortic arch. 6. Atherosclerotic changes within the cavernous carotid arteries bilaterally without significant stenosis. 7. Moderate distal small vessel disease in the MCA and PCA branch vessels. These results were called by telephone at the time of interpretation on 05/10/2014 at 12:12 pm to Dr. Roland Rack, who verbally acknowledged these results.   Electronically Signed   By: Lawrence Santiago M.D.   On: 05/10/2014 12:44   Ct Angio Chest Pe W/cm &/or Wo Cm  05/10/2014   CLINICAL DATA:  Found on couch unresponsive. Shortness of breath for 3-4 days.  EXAM: CT ANGIOGRAPHY CHEST WITH CONTRAST  TECHNIQUE: Multidetector CT imaging of the chest was performed using the standard protocol during bolus administration of intravenous contrast. Multiplanar CT image reconstructions and MIPs were obtained to evaluate the vascular anatomy.  CONTRAST:  75mL OMNIPAQUE IOHEXOL 350 MG/ML SOLN, 45mL OMNIPAQUE IOHEXOL 350 MG/ML SOLN  COMPARISON:  None  FINDINGS: Mediastinum: The heart size appears normal. No pericardial effusion. The trachea appears patent. There is an endotracheal tube with tip above the carina. Nasogastric tube is identified  within the esophagus. There is no mediastinal or hilar adenopathy identified. No supraclavicular or axillary adenopathy. There is calcified atherosclerotic disease involving the thoracic aorta. Calcified plaque is also noted within the LAD and RCA coronary arteries. Normal appearance of the pulmonary artery. No lobar or segmental filling defects identified to suggest pulmonary embolus.  Lungs/Pleura: There is no pleural effusion identified. Dependent changes noted in the right lung base. No airspace consolidation. Left upper lobe lung nodule measures 8 mm.  Upper Abdomen: Incidental imaging through the upper abdomen is unremarkable.  Musculoskeletal: Review of the visualized bony structures is significant for thoracic kyphosis. There is multi level degenerative disc disease identified. No acute bone abnormality noted.  Review of the MIP images confirms the above  findings.  IMPRESSION: 1. Exam is negative for acute pulmonary embolus. 2. Atherosclerotic disease including multi vessel coronary artery calcification. 3. Left lung nodule measures 8 mm If the patient is at high risk for bronchogenic carcinoma, follow-up chest CT at 3-66months is recommended. If the patient is at low risk for bronchogenic carcinoma, follow-up chest CT at 6-12 months is recommended. This recommendation follows the consensus statement: Guidelines for Management of Small Pulmonary Nodules Detected on CT Scans: A Statement from the [employer] as published in Radiology 2005; 237:395-400.   Electronically Signed   By: Kerby Moors M.D.   On: 05/10/2014 12:29   Mr Brain Wo Contrast  05/12/2014   CLINICAL DATA:  58 year old male last seen normal at 1 a.m. on 05/10/2014. Found unresponsive. On admission was unresponsive with bradycardia, and atrial fibrillation. No known past medical history. Improving altered mental status.  EXAM: MRI HEAD WITHOUT CONTRAST  TECHNIQUE: Multiplanar, multiecho pulse sequences of the brain and surrounding  structures were obtained without intravenous contrast.  COMPARISON:  CT head 05/10/2014.  CT angio head and neck 05/10/2014.  FINDINGS: Motion degraded exam.  Overall study diagnostic.  Widespread areas of restricted diffusion affecting primarily the BILATERAL globus pallidus, other deep nuclei, and periventricular white matter, predominantly supratentorial, but with involvement of the cerebellum, consistent with a hypoxic/ischemic and/or hypotensive insult. Carbon monoxide poisoning can also have this appearance, but given the punctate deep white matter areas of restricted diffusion, is less favored. No hemorrhage, mass lesion, hydrocephalus, or extra-axial fluid. Premature for age atrophy. Mild subcortical and periventricular T2 and FLAIR hyperintensities, likely chronic microvascular ischemic change. 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 Flow voids are maintained in the carotid, basilar, and vertebral arteries. There is no obvious head trauma or osseous lesion. Upper cervical region unremarkable.  Compared with prior CT studies, the these changes are not visible.  IMPRESSION: Widespread areas of restricted diffusion involving both the deep nuclei (predominately globus pallidus) as well as the deep white matter in a watershed pattern. Findings are consistent with a hypoxic/ischemic and/or hypertensive insult. Carbon monoxide poisoning has some similar imaging features; see discussion above.   Electronically Signed   By: Rolla Flatten M.D.   On: 05/12/2014 10:46   Nm Myocar Multi W/spect W/wall Motion / Ef  05/15/2014   CLINICAL DATA:  58 year old male recently admitted with respiratory failure, aspiration pneumonia and septic shock. Additionally, had atrial fibrillation with rapid ventricular response and elevated troponins thought to be related O2 demand ischemia from the AFib with RVR. Nuclear medicine cardiac  stress test warranted to evaluate for reversible ischemia.  EXAM: MYOCARDIAL IMAGING WITH SPECT (REST AND PHARMACOLOGIC-STRESS)  GATED LEFT VENTRICULAR WALL MOTION STUDY  LEFT VENTRICULAR EJECTION FRACTION  TECHNIQUE: Standard myocardial SPECT imaging was performed after resting intravenous injection of 10 mCi Tc-58m sestamibi. Subsequently, intravenous infusion of Lexiscan was performed under the supervision of the Cardiology staff. At peak effect of the drug, 30 mCi Tc-60m sestamibi was injected intravenously and standard myocardial SPECT imaging was performed. Quantitative gated imaging was also performed to evaluate left ventricular wall motion, and estimate left ventricular ejection fraction.  COMPARISON:  Chest x-ray 05/11/2014  FINDINGS: Perfusion: No decreased activity in the left ventricle on stress imaging to suggest reversible ischemia or infarction. Mildly decreased radiotracer uptake in the inferior wall on both the resting and stress images likely represents diaphragmatic attenuation in a male patient.  Wall Motion: Normal  left ventricular wall motion. No left ventricular dilation.  Left Ventricular Ejection Fraction: 61 %  End diastolic volume 76 ml  End systolic volume 29 ml  IMPRESSION: 1. No reversible ischemia or infarction.  2. Normal left ventricular wall motion.  3. Left ventricular ejection fraction 61%  4. Low-risk stress test findings*.  *2012 Appropriate Use Criteria for Coronary Revascularization Focused Update: J Am Coll Cardiol. 4132;44(0):102-725. http://content.[email or web address].aspx?articleid=[email or web address]   Electronically Signed   By: Jacqulynn Cadet M.D.   On: 05/15/2014 11:15   Dg Chest Port 1 View  05/11/2014   CLINICAL DATA:  Acute respiratory failure with difficult intubation, septic shock and aspiration pneumonia  EXAM: PORTABLE CHEST - 1 VIEW  COMPARISON:  05/10/2014  FINDINGS: The cardiac shadow is mildly enlarged but stable. An endotracheal tube is noted 2.6 cm above the  carina. A nasogastric catheter and left jugular central line are again seen and stable. The lungs are well aerated with minimal bibasilar atelectatic changes. No focal confluent infiltrate is seen. No pneumothorax is noted. No bony abnormality is seen.  IMPRESSION: Tubes and lines as described above.  Bibasilar atelectatic changes.   Electronically Signed   By: Inez Catalina M.D.   On: 05/11/2014 08:02   Dg Chest Port 1 View  05/10/2014   CLINICAL DATA:  58 year old male found unresponsive in home by roommate at 0630 hr, bradycardia on EMS arrival, intubated. Initial encounter.  EXAM: PORTABLE CHEST - 1 VIEW  COMPARISON:  None.  FINDINGS: Portable AP supine view at 0818 hr. Endotracheal tube in good position at the level the clavicles. Enteric tube courses to the abdomen, side hole the level of the gastric body.  Mildly low lung volumes. Normal cardiac size and mediastinal contours. Visualized tracheal air column is within normal limits. No pneumothorax, pulmonary edema, pleural effusion or confluent pulmonary opacity.  IMPRESSION: 1. ET tube and enteric tube in good position. 2. Mildly low lung volumes.  No acute cardiopulmonary abnormality.   Electronically Signed   By: Lars Pinks M.D.   On: 05/10/2014 08:34   Dg Chest Port 1v Same Day  05/10/2014   CLINICAL DATA:  Central catheter placement ; hypoxia  EXAM: PORTABLE CHEST - 1 VIEW SAME DAY  COMPARISON:  Study obtained earlier in the day  FINDINGS: Central catheter tip is in the left innominate vein. Endotracheal tube tip is 4.1 cm above the carina. Nasogastric tube tip and side port are in the stomach. No pneumothorax. There is no edema or consolidation. Heart is upper normal in size with pulmonary vascularity within normal limits. Thoracolumbar levoscoliosis is stable.  IMPRESSION: New central catheter tip is in the left innominate vein. Tube positions as described. No pneumothorax. No lung edema or consolidation.   Electronically Signed   By: Lowella Grip M.D.   On: 05/10/2014 10:48    Oren Binet, MD  [facility] Pager:[phone]  If 7PM-7AM, please contact night-coverage [email or web address] Password [email or web address] 05/18/2014, 8:46 AM   LOS: 8 days

## 2014-05-18 NOTE — Progress Notes (Signed)
Nursing did not release orders after IR procedure yesterday. Released this am and low intermittent wall suction hooked up to G tube. Will wait for PA to determine when to clamp and ready to use.

## 2014-05-18 NOTE — Progress Notes (Signed)
Patient ID: Donald Hughes, male   DOB: 1956-05-02, 58 y.o.   MRN: [id number]     Subjective: Pt doing ok; a little sore at port/g tube sites as expected   Allergies: Review of patient's allergies indicates no known allergies.  Medications: Prior to Admission medications   Not on File    Review of Systems  Vital Signs: BP 119/85  Pulse 69  Temp(Src) 98.1 F (36.7 C) (Oral)  Resp 18  Ht 5\' 5"  (1.651 m)  Wt 177 lb 4.8 oz (80.423 kg)  BMI 29.50 kg/m2  SpO2 96%  Physical Examrt chest wall port site/Gtube site clean and dry, mildly tender, dressings dry; abd soft,ND  Imaging: Ct Soft Tissue Neck W Contrast  05/15/2014   CLINICAL DATA:  58 year old male with 2 months of hoarseness without pain. No bleeding or hemoptysis. Recent smoker. Recent intubation. Now extubated. Flexible laryngoscopy performed on 05/14/2014 demonstrated visible serration on lower NG all surface of left area epiglottic fold. Epiglottis with swelling and edema.  EXAM: CT NECK WITH CONTRAST  TECHNIQUE: Multidetector CT imaging of the neck was performed using the standard protocol following the bolus administration of intravenous contrast.  COMPARISON:  Prior CTA of the neck from 05/10/2014  FINDINGS: The visualized portions of the brain and posterior fossa are unremarkable. Orbits within normal limits.  Mild polypoid opacity present within the maxillary sinuses bilaterally. Paranasal sinuses are otherwise clear. No mastoid effusion.  The salivary glands including the parotid glands and submandibular glands are within normal limits.  Oral cavity is normal in appearance without mass lesion or loculated fluid collection. The patient is edentulous. Palatine tonsils are symmetric bilaterally. Uvula is normal. Parapharyngeal fat is preserved. Nasopharynx and oropharynx within normal limits.  No retropharyngeal fluid collection.  There is mild diffuse epiglottic swelling and edema. Additionally, there is mild edema and  swelling along the aryepiglottic folds bilaterally. There is mild irregularity along the left aryepiglottic fold (series 2, image 85), which may reflect the ulceration visualized on recent laryngoscopy. There is fullness of the left piriform sinus without definite submucosal mass lesion. The right piriform sinus is preserved. Additionally, asymmetric fullness in the region of the right pre epiglottic fat and right false vocal cord (series 2, image 81). Evaluation of this area somewhat difficult due to motion artifact. True vocal cords are fairly symmetric inferiorly. Thyroid and cricoid cartilages within normal limits. Subglottic airway is clear.  There is an enlarged right level II 8 node measuring 2 cm in short access (series 2, image 66). Central hypodensity within this note is concerning for necrosis. Adjacent mildly prominent right level II a node measures 8 mm in short axis. While this node measures within normal limits for size, central hypodensity also worrisome for necrosis. No left-sided cervical adenopathy. No supraclavicular lymph nodes.  Thyroid gland within normal limits.  Visualized portions of the lung apices are clear.  Scattered atheromatous plaque present about the carotid bifurcations and proximal internal carotid arteries, better evaluated on recent CTA.  Multilevel degenerative changes noted within the visualized spine. No worrisome lytic or blastic osseous lesions. No acute osseous abnormality.  IMPRESSION: 1. Edema with swelling within the epiglottis and aryepiglottic folds bilaterally, which may be related to recent intubation. Airway remains widely patent. 2. Subtle asymmetric irregularity along the laryngeal surface of the left aryepiglottic fold, which may reflect the ulcerative lesion seen on flexible laryngoscopy performed earlier on the same day. There is associated fullness within the adjacent left piriform sinus. 3. Apparent fullness  and asymmetry within the right pre epiglottic fat  extending inferiorly towards the right false vocal cord. Evaluation of this area is somewhat difficult due to motion artifact. Attention at possible future laryngoscopy/biopsy recommended. 4. Enlarged 2.0 cm necrotic right level IIa node, worrisome for nodal metastasis. An adjacent 8 mm node is also worrisome with suggestion of central necrosis, although is within normal limits for size criteria.   Electronically Signed   By: Jeannine Boga M.D.   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 Daryll Brod, MD  Guidance:  FLUOROSCOPIC  FLUOROSCOPY TIME:  5 MIN 24 SECONDS  MEDICATIONS AND MEDICAL HISTORY: 1 G VANCOMYCIN AND 400 MG CIPROAdministered within 1 hour of the procedure,1.5 MG VERSED, 75 MCG FENTANYL  ANESTHESIA/SEDATION: 60 min  CONTRAST:  68mL OMNIPAQUE IOHEXOL 300 MG/ML  SOLN  COMPLICATIONS: None immediate  PROCEDURE: Informed consent was obtained from the patient following explanation of the procedure, risks, benefits and alternatives. The patient understands, agrees and consents for the procedure. All questions were addressed. A time out was performed.  Maximal barrier sterile technique utilized including caps, mask, sterile gowns, sterile gloves, large sterile drape, hand hygiene, and betadine prep.  The left upper quadrant was sterilely prepped and draped. An oral gastric catheter was inserted into the stomach under fluoroscopy. The existing nasogastric feeding tube was removed. Air was injected into the stomach for insufflation and visualization under fluoroscopy. The air distended stomach was confirmed beneath the anterior abdominal wall in the frontal and lateral projections. Under sterile conditions and local anesthesia, a 66 gauge trocar needle was utilized to access the  stomach percutaneously beneath the left subcostal margin. Needle position was confirmed within the stomach under biplane fluoroscopy. Contrast injection confirmed position also. A single T tack was deployed for gastropexy. Over an Amplatz guide wire, a 9-French sheath was inserted into the stomach. A snare device was utilized to capture the oral gastric catheter. The snare device was pulled retrograde from the stomach up the esophagus and out the oropharynx. The 20-French pull-through gastrostomy was connected to the snare device and pulled antegrade through the oropharynx down the esophagus into the stomach and then through the percutaneous tract external to the patient. The gastrostomy was assembled externally. Contrast injection confirms position in the stomach. Images were obtained for documentation. The patient tolerated procedure well. No immediate complication.  IMPRESSION: Fluoroscopic insertion of a 20-French "pull-through" gastrostomy.   Electronically Signed   By: Daryll Brod M.D.   On: 05/17/2014 16:35   Nm Myocar Multi W/spect W/wall Motion / Ef  05/15/2014   CLINICAL DATA:  58 year old male recently admitted with respiratory failure, aspiration pneumonia and septic shock. Additionally, had atrial fibrillation with rapid ventricular response and elevated troponins thought to be related O2 demand ischemia from the AFib with RVR. Nuclear medicine cardiac stress test warranted to evaluate for reversible ischemia.  EXAM: MYOCARDIAL IMAGING WITH SPECT (REST AND PHARMACOLOGIC-STRESS)  GATED LEFT VENTRICULAR WALL MOTION STUDY  LEFT VENTRICULAR EJECTION FRACTION  TECHNIQUE: Standard myocardial SPECT imaging was performed after resting intravenous injection of 10 mCi Tc-72m sestamibi. Subsequently, intravenous infusion of Lexiscan was performed under the supervision of the Cardiology staff. At peak effect of the drug, 30 mCi Tc-2m sestamibi was injected intravenously and standard myocardial SPECT imaging  was  performed. Quantitative gated imaging was also performed to evaluate left ventricular wall motion, and estimate left ventricular ejection fraction.  COMPARISON:  Chest x-ray 05/11/2014  FINDINGS: Perfusion: No decreased activity in the left ventricle on stress imaging to suggest reversible ischemia or infarction. Mildly decreased radiotracer uptake in the inferior wall on both the resting and stress images likely represents diaphragmatic attenuation in a male patient.  Wall Motion: Normal left ventricular wall motion. No left ventricular dilation.  Left Ventricular Ejection Fraction: 61 %  End diastolic volume 76 ml  End systolic volume 29 ml  IMPRESSION: 1. No reversible ischemia or infarction.  2. Normal left ventricular wall motion.  3. Left ventricular ejection fraction 61%  4. Low-risk stress test findings*.  *2012 Appropriate Use Criteria for Coronary Revascularization Focused Update: J Am Coll Cardiol. 4128;78(6):767-209. http://content.airportbarriers.com.aspx?articleid=1201161   Electronically Signed   By: Jacqulynn Cadet M.D.   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  EXAM: ULTRASOUND GUIDED CORE BIOPSY OF RIGHT CERVICAL LYMPHADENOPATHY  MEDICATIONS: 1.0 mg IV Versed; 25 mcg IV Fentanyl  Total Moderate Sedation Time: 13 min  COMPARISON:  CT of the neck on 05/14/2014  PROCEDURE: The procedure, risks, benefits, and alternatives were explained to the patient. Questions regarding the procedure were encouraged and answered. The patient understands and consents to the procedure.  The right neck was prepped with Betadine in a sterile fashion, and a sterile drape was applied covering the operative field. A sterile gown and sterile gloves were used for the procedure. A time-out procedure was performed. Local anesthesia was provided with 1% Lidocaine.  Ultrasound  was performed of the right upper neck. After localizing an enlarged lymph node, an 18 gauge core needle biopsy device was utilized in performing core biopsy. A total of 5 samples were obtained and submitted in saline for histologic analysis. Post biopsy imaging was performed by ultrasound.  COMPLICATIONS: None.  FINDINGS: Lobulated and heterogeneous appearing enlarged right upper cervical lymph node corresponds to the partially necrotic appearing level II node by CT. This lymph node measures approximately 2.4 cm in greatest diameter by ultrasound. Solid tissue was obtained from the lymph node.  IMPRESSION: Ultrasound-guided core biopsy performed of enlarged right upper cervical lymph node.   Electronically Signed   By: Aletta Edouard M.D.   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 Daryll Brod, MD  Guidance:  Ultrasound and fluoroscopic  FLUOROSCOPY TIME:  5 min 24 seconds  MEDICATIONS AND MEDICAL HISTORY: 1 g vancomycin and 400 mg Ciproadministered within 1 hour of the procedure.1.5 mg versed, 75 mcg fentanyl  ANESTHESIA/SEDATION: 60 min  CONTRAST:  69mL OMNIPAQUE IOHEXOL 300 MG/ML  SOLN  COMPLICATIONS: None immediate  PROCEDURE: Informed consent was obtained from the patient following explanation of the procedure, risks, benefits and alternatives. The patient understands, agrees and consents for the procedure. All questions were addressed. A time out was performed.  Maximal barrier sterile technique utilized including caps, mask, sterile gowns, sterile gloves, large sterile drape, hand hygiene, and 2% chlorhexidine scrub.  Under sterile conditions and local anesthesia, right internal jugular micropuncture venous access was performed. Access was performed with  ultrasound.  Images were obtained for documentation. A guide wire was inserted followed by a transitional dilator. This allowed insertion of a guide wire and catheter into the IVC. Measurements were obtained from the SVC / RA junction back to the right IJ venotomy site. In the right infraclavicular chest, a subcutaneous pocket was created over the second anterior rib. This was done under sterile conditions and local anesthesia. 1% lidocaine with epinephrine was utilized for this. A 2.5 cm incision was made in the skin. Blunt dissection was performed to create a subcutaneous pocket over the right pectoralis major muscle. The pocket was flushed with saline vigorously. There was adequate hemostasis. The port catheter was assembled and checked for leakage. The port catheter was secured in the pocket with two retention sutures. The tubing was tunneled subcutaneously to the right venotomy site and inserted into the SVC/RA junction through a valved peel-away sheath. Position was confirmed with fluoroscopy. Images were obtained for documentation. The patient tolerated the procedure well. No immediate complications. Incisions were closed in a two layer fashion with 4 - 0 Vicryl suture. Dermabond was applied to the skin. The port catheter was accessed, blood was aspirated followed by saline and heparin flushes. Needle was removed. A dry sterile dressing was applied.  IMPRESSION: Ultrasound and fluoroscopically guided right internal jugular single lumen power port catheter insertion. Tip in the SVC/RA junction. Catheter ready for use.   Electronically Signed   By: Daryll Brod M.D.   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 Daryll Brod, MD  Guidance:  Ultrasound and fluoroscopic  FLUOROSCOPY  TIME:  5 min 24 seconds  MEDICATIONS AND MEDICAL HISTORY: 1 g vancomycin and 400 mg Ciproadministered within 1 hour of the procedure.1.5 mg versed, 75 mcg fentanyl  ANESTHESIA/SEDATION: 60 min  CONTRAST:  45mL OMNIPAQUE IOHEXOL 300 MG/ML  SOLN  COMPLICATIONS: None immediate  PROCEDURE: Informed consent was obtained from the patient following explanation of the procedure, risks, benefits and alternatives. The patient understands, agrees and consents for the procedure. All questions were addressed. A time out was performed.  Maximal barrier sterile technique utilized including caps, mask, sterile gowns, sterile gloves, large sterile drape, hand hygiene, and 2% chlorhexidine scrub.  Under sterile conditions and local anesthesia, right internal jugular micropuncture venous access was performed. Access was performed with ultrasound. Images were obtained for documentation. A guide wire was inserted followed by a transitional dilator. This allowed insertion of a guide wire and catheter into the IVC. Measurements were obtained from the SVC / RA junction back to the right IJ venotomy site. In the right infraclavicular chest, a subcutaneous pocket was created over the second anterior rib. This was done under sterile conditions and local anesthesia. 1% lidocaine with epinephrine was utilized for this. A 2.5 cm incision was made in the skin. Blunt dissection was performed to create a subcutaneous pocket over the right pectoralis major muscle. The pocket was flushed with saline vigorously. There was adequate hemostasis. The port catheter was assembled and checked for leakage. The port catheter was secured in the pocket with two retention sutures. The tubing was tunneled subcutaneously to the right venotomy site and inserted into the SVC/RA junction through a valved peel-away sheath. Position was confirmed with fluoroscopy. Images were obtained  for documentation. The patient tolerated the procedure well. No immediate  complications. Incisions were closed in a two layer fashion with 4 - 0 Vicryl suture. Dermabond was applied to the skin. The port catheter was accessed, blood was aspirated followed by saline and heparin flushes. Needle was removed. A dry sterile dressing was applied.  IMPRESSION: Ultrasound and fluoroscopically guided right internal jugular single lumen power port catheter insertion. Tip in the SVC/RA junction. Catheter ready for use.   Electronically Signed   By: Daryll Brod M.D.   On: 05/17/2014 16:32    Labs:  CBC:  Recent Labs  05/11/14 0320 05/12/14 0304 05/13/14 0215 05/16/14 0547  WBC 10.9* 11.4* 12.0* 8.8  HGB 11.2* 11.1* 11.6* 14.4  HCT 36.0* 37.4* 39.8 45.3  PLT 169 170 186 193    COAGS:  Recent Labs  05/10/14 1130 05/16/14 0547  INR 1.37 1.11  APTT 27  --     BMP:  Recent Labs  05/12/14 0304 05/13/14 0215 05/16/14 0547 05/17/14 0527  NA 146 144 140 139  K 4.8 4.6 3.3* 3.4*  CL 109 103 95* 98  CO2 32 34* 35* 30  GLUCOSE 125* 84 100* 116*  BUN 22 21 23 16   CALCIUM 8.6 9.0 9.2 8.9  CREATININE 0.68 0.67 0.63 0.57  GFRNONAA >90 >90 >90 >90  GFRAA >90 >90 >90 >90    LIVER FUNCTION TESTS:  Recent Labs  05/10/14 1130  BILITOT 0.3  AST 64*  ALT 31  ALKPHOS 72  PROT 5.9*  ALBUMIN 2.5*    Assessment and Plan: S/p port a cath/G tube placement 10/8; ok to use both sites . Other plans as per primary.        Signed: Autumn Messing 05/18/2014, 12:33 PM

## 2014-05-18 NOTE — Progress Notes (Signed)
Physical Therapy Treatment Patient Details Name: Donald Hughes MRN: [id number] DOB: 26-May-1956 Today's Date: 05/18/2014    History of Present Illness 58 yo male smoker found unresponsive by roommate >> noted to have bradycardia and then A fib with RVR, and intubated for airway protection.VDRF 10/1-10/2     PT Comments    Progressing steadily.  Pt finally is aware of what is going on with his throat.  We discussed progressive ambulation when he felt well within his treatments.  Follow Up Recommendations  Home health PT     Equipment Recommendations  None recommended by PT    Recommendations for Other Services       Precautions / Restrictions Precautions Precautions: Fall Restrictions Weight Bearing Restrictions: No    Mobility  Bed Mobility Overal bed mobility: Modified Independent Bed Mobility: Supine to Sit;Sit to Supine     Supine to sit: Modified independent (Device/Increase time) Sit to supine: Modified independent (Device/Increase time)   General bed mobility comments: Much more functional,  effortlessly got in/out of bed  Transfers Overall transfer level: Needs assistance Equipment used: None Transfers: Sit to/from Stand Sit to Stand: Supervision         General transfer comment: supervision only for safety  Ambulation/Gait Ambulation/Gait assistance: Supervision Ambulation Distance (Feet): 250 Feet Assistive device: None Gait Pattern/deviations: Step-through pattern Gait velocity: more functional speed, but slower than age appropriated   General Gait Details: generally steady with good heel/toe pattern   Stairs Stairs: Yes Stairs assistance: Supervision Stair Management: One rail Right;Alternating pattern;Forwards Number of Stairs: 3 General stair comments: steady with rail  Wheelchair Mobility    Modified Rankin (Stroke Patients Only)       Balance Overall balance assessment: Needs assistance   Sitting balance-Leahy Scale: Good      Standing balance support: Bilateral upper extremity supported;During functional activity Standing balance-Leahy Scale: Good Standing balance comment: stood steadily at sink doing adls for 5 min w/o assist.                    Cognition Arousal/Alertness: Awake/alert Behavior During Therapy: WFL for tasks assessed/performed Overall Cognitive Status: Within Functional Limits for tasks assessed                 General Comments: Overall pt seemed very functional today.  Answered safety questions correctly and orientation questions correctly.    Exercises      General Comments        Pertinent Vitals/Pain Pain Assessment: 0-10 Pain Score: 2  Pain Location: throat Pain Descriptors / Indicators: Aching Pain Intervention(s): Monitored during session    Home Living                      Prior Function            PT Goals (current goals can now be found in the care plan section) Acute Rehab PT Goals Patient Stated Goal: To get better and back to his normal baseline.  PT Goal Formulation: With patient Time For Goal Achievement: 05/27/14 Potential to Achieve Goals: Good Progress towards PT goals: Progressing toward goals    Frequency  Min 3X/week    PT Plan Current plan remains appropriate    Co-evaluation             End of Session   Activity Tolerance: Patient tolerated treatment well Patient left: in bed;with call bell/phone within reach;with bed alarm set     Time: 6010-9323 PT Time Calculation (  min): 17 min  Charges:  $Gait Training: 8-22 mins                    G Codes:      Tamkia Temples, Tessie Fass 05/18/2014, 3:14 PM 05/18/2014  Donnella Sham, PT [phone]  (pager)

## 2014-05-19 DIAGNOSIS — C329 Malignant neoplasm of larynx, unspecified: Secondary | ICD-10-CM

## 2014-05-19 LAB — BASIC METABOLIC PANEL
ANION GAP: 13 (ref 5–15)
BUN: 22 mg/dL (ref 6–23)
CO2: 27 mEq/L (ref 19–32)
Calcium: 9.3 mg/dL (ref 8.4–10.5)
Chloride: 100 mEq/L (ref 96–112)
Creatinine, Ser: 0.9 mg/dL (ref 0.50–1.35)
GFR calc Af Amer: >90 mL/min (ref >90)
Glucose, Bld: 86 mg/dL (ref 70–99)
POTASSIUM: 3.8 meq/L (ref 3.7–5.3)
SODIUM: 140 meq/L (ref 137–147)

## 2014-05-19 LAB — GLUCOSE, CAPILLARY
Glucose-Capillary: 87 mg/dL (ref 70–99)
Glucose-Capillary: 89 mg/dL (ref 70–99)
Glucose-Capillary: 89 mg/dL (ref 70–99)
Glucose-Capillary: 90 mg/dL (ref 70–99)

## 2014-05-19 MED ORDER — OSMOLITE 1.2 CAL PO LIQD: 360.0000 mL | Freq: Every day | ORAL | Status: AC

## 2014-05-19 MED ORDER — ASPIRIN 81 MG PO CHEW: 81.0000 mg | CHEWABLE_TABLET | Freq: Every day | ORAL | Status: AC

## 2014-05-19 MED ORDER — LISINOPRIL 10 MG PO TABS: 10.0000 mg | ORAL_TABLET | Freq: Every day | ORAL | Status: DC

## 2014-05-19 MED ORDER — ALBUTEROL SULFATE HFA 108 (90 BASE) MCG/ACT IN AERS: 2.0000 | INHALATION_SPRAY | Freq: Four times a day (QID) | RESPIRATORY_TRACT | Status: AC | PRN

## 2014-05-19 MED ORDER — LISINOPRIL 10 MG PO TABS: 10.0000 mg | ORAL_TABLET | Freq: Every day | ORAL | Status: AC

## 2014-05-19 NOTE — Progress Notes (Signed)
05/19/14 Patient discharged from hospital with family. Will have Homecare for tube feedings, IV site removed, discharge paperwork reviewed with patient and family.

## 2014-05-19 NOTE — Discharge Summary (Addendum)
PATIENT DETAILS Name: Donald Hughes Age: 58 y.o. Sex: male Date of Birth: Dec 30, 1955 MRN: [id number]. Admitting Physician: Rush Farmer, MD PCP:No primary provider on file.  Admit Date: 05/10/2014 Discharge date: 05/19/2014  Recommendations for Outpatient Follow-up:  1. Needs referral to [facility] in [place] for both medical oncology and radiation oncology. 2. Will need continued close monitoring, including frequent CBC, and chemistry checks 3. Will  need a referral to speech therapy as outpatient.  PRIMARY DISCHARGE DIAGNOSIS:  Active Problems:   Respiratory failure, acute   Altered mental status   Septic shock   Aspiration pneumonia   Atrial fibrillation   Elevated troponin   Demand ischemia      PAST MEDICAL HISTORY: Past Medical History  Diagnosis Date  . Difficult intubation 05/10/14    laryngeal edema, difficult DL and video laryngoscopy    DISCHARGE MEDICATIONS:   Medication List    STOP taking these medications       acetaminophen 325 MG tablet  Commonly known as:  TYLENOL     ibuprofen 200 MG tablet  Commonly known as:  ADVIL,MOTRIN      TAKE these medications       albuterol 108 (90 BASE) MCG/ACT inhaler  Commonly known as:  PROVENTIL HFA;VENTOLIN HFA  Inhale 2 puffs into the lungs every 6 (six) hours as needed for wheezing or shortness of breath.     aspirin 81 MG chewable tablet  Place 1 tablet (81 mg total) into feeding tube daily.     feeding supplement (OSMOLITE 1.2 CAL) Liqd  Place 360 mLs into feeding tube 5 (five) times daily.     lisinopril 10 MG tablet  Commonly known as:  PRINIVIL,ZESTRIL  Place 1 tablet (10 mg total) into feeding tube daily.  Start taking on:  05/20/2014        ALLERGIES:  No Known Allergies  BRIEF HPI:  See H&P, Labs, Consult and Test reports for all details in,Patient is a 57 year old male with a history of smoking, obesity, who developed hoarseness of voice a month prior to this admission,  admitted on 10/1 as he was found unresponsive by his girlfriend. Patient was intubated, and admitted to the intensive care unit.  CONSULTATIONS:   cardiology, pulmonary/intensive care, hematology/oncology and Radiation Oncology, IR and ENT  PERTINENT RADIOLOGIC STUDIES: Ct Angio Head W/cm &/or Wo Cm  05/10/2014   CLINICAL DATA:  INITIAL ENCOUNTER. CODE STROKE. Patient found unresponsive at 6 o'clock a.m. Last seen well at 1 o'clock a.m.  EXAM: CT ANGIOGRAPHY HEAD AND NECK  TECHNIQUE: Multidetector CT imaging of the head and neck was performed using the standard protocol during bolus administration of intravenous contrast. Multiplanar CT image reconstructions and MIPs were obtained to evaluate the vascular anatomy. Carotid stenosis measurements (when applicable) are obtained utilizing NASCET criteria, using the distal internal carotid diameter as the denominator.  CONTRAST:  74mL OMNIPAQUE IOHEXOL 350 MG/ML SOLN, 24mL OMNIPAQUE IOHEXOL 350 MG/ML SOLN  COMPARISON:  CT head without contrast 05/10/2014.  FINDINGS: CTA HEAD FINDINGS  The source images demonstrate no acute infarct. The basal ganglia are intact. The postcontrast images demonstrate no pathologic enhancement. The patient is intubated. An NG tube is in place.  Atherosclerotic changes are present within the distal internal carotid arteries bilaterally without significant stenosis. The A1 and M1 segments are normal. The anterior communicating artery is patent. The bifurcations are intact. There is moderate attenuation of distal MCA branch vessels bilaterally.  The distal left vertebral artery is narrowed  P on the dura. Right vertebral artery is intact. The left PICA origin is visualized and normal. The right AICA is dominant. The basilar artery is within normal limits. The left posterior cerebral artery originates from the basilar tip. The right posterior cerebral artery is of fetal type. There is moderate attenuation of PCA branch vessels bilaterally.   Review of the MIP images confirms the above findings.  CTA NECK FINDINGS  A 3 vessel arch configuration is present. Right lateral irregularity and focally ectasia is noted in the proximal innominate artery. The artery measures up to 21 mm in diameter. The proximal vessels are otherwise normal. Atherosclerotic calcifications are present in the aortic arch.  The vertebral artery is slightly dominant to the left. There is focal narrowing of the distal left vertebral artery at the level of C2-2.  The right common carotid artery demonstrates atherosclerotic changes at the bifurcation. There some calcific changes in the proximal right ICA without significant stenosis. The right ICA is tortuous without distal stenosis.  The left common carotid artery is within normal limits. Atherosclerotic changes are noted at the bifurcation and proximal left ICA without significant stenosis. The left ICA is tortuous proximally. The distal left ICA is normal.  The bone windows demonstrate multilevel disc disease with uncovertebral spurring from C3-4 through C6-7. No focal lytic or blastic lesions are present. The lung apices are clear.  Review of the MIP images confirms the above findings.  IMPRESSION: 1. Atherosclerotic changes at the carotid bifurcations bilaterally without significant stenosis. 2. ICA tortuosity is worse right than left without significant stenosis. 3. Focal narrowing of the distal left vertebral artery at the level of C2 and the dura with a small irregular vessel extending to the vertebrobasilar junction. 4. The basilar artery is intact. 5. Focal ectasia of the innominate artery measuring up to 21 mm just above the aortic arch. 6. Atherosclerotic changes within the cavernous carotid arteries bilaterally without significant stenosis. 7. Moderate distal small vessel disease in the MCA and PCA branch vessels. These results were called by telephone at the time of interpretation on 05/10/2014 at 12:12 pm to Dr. Roland Rack, who verbally acknowledged these results.   Electronically Signed   By: Lawrence Santiago M.D.   On: 05/10/2014 12:44   Ct Head Wo Contrast  05/10/2014   CLINICAL DATA:  Patient unresponsive.  EXAM: CT HEAD WITHOUT CONTRAST  TECHNIQUE: Contiguous axial images were obtained from the base of the skull through the vertex without intravenous contrast.  COMPARISON:  None.  FINDINGS: There is no evidence of acute intracranial abnormality including infarct, hemorrhage, mass lesion, mass effect, midline shift or abnormal extra-axial fluid collection. Mucous retention cysts or polyps are seen in the maxillary sinuses bilaterally. Mastoid air cells and middle ears are clear. The calvarium is intact.  IMPRESSION: No acute intracranial abnormality.  Mucous retention cysts or polyps in the maxillary sinuses.   Electronically Signed   By: Inge Rise M.D.   On: 05/10/2014 09:04   Ct Soft Tissue Neck W Contrast  05/15/2014   CLINICAL DATA:  58 year old male with 2 months of hoarseness without pain. No bleeding or hemoptysis. Recent smoker. Recent intubation. Now extubated. Flexible laryngoscopy performed on 05/14/2014 demonstrated visible serration on lower NG all surface of left area epiglottic fold. Epiglottis with swelling and edema.  EXAM: CT NECK WITH CONTRAST  TECHNIQUE: Multidetector CT imaging of the neck was performed using the standard protocol following the bolus administration of intravenous contrast.  COMPARISON:  Prior  CTA of the neck from 05/10/2014  FINDINGS: The visualized portions of the brain and posterior fossa are unremarkable. Orbits within normal limits.  Mild polypoid opacity present within the maxillary sinuses bilaterally. Paranasal sinuses are otherwise clear. No mastoid effusion.  The salivary glands including the parotid glands and submandibular glands are within normal limits.  Oral cavity is normal in appearance without mass lesion or loculated fluid collection. The patient is  edentulous. Palatine tonsils are symmetric bilaterally. Uvula is normal. Parapharyngeal fat is preserved. Nasopharynx and oropharynx within normal limits.  No retropharyngeal fluid collection.  There is mild diffuse epiglottic swelling and edema. Additionally, there is mild edema and swelling along the aryepiglottic folds bilaterally. There is mild irregularity along the left aryepiglottic fold (series 2, image 85), which may reflect the ulceration visualized on recent laryngoscopy. There is fullness of the left piriform sinus without definite submucosal mass lesion. The right piriform sinus is preserved. Additionally, asymmetric fullness in the region of the right pre epiglottic fat and right false vocal cord (series 2, image 81). Evaluation of this area somewhat difficult due to motion artifact. True vocal cords are fairly symmetric inferiorly. Thyroid and cricoid cartilages within normal limits. Subglottic airway is clear.  There is an enlarged right level II 8 node measuring 2 cm in short access (series 2, image 66). Central hypodensity within this note is concerning for necrosis. Adjacent mildly prominent right level II a node measures 8 mm in short axis. While this node measures within normal limits for size, central hypodensity also worrisome for necrosis. No left-sided cervical adenopathy. No supraclavicular lymph nodes.  Thyroid gland within normal limits.  Visualized portions of the lung apices are clear.  Scattered atheromatous plaque present about the carotid bifurcations and proximal internal carotid arteries, better evaluated on recent CTA.  Multilevel degenerative changes noted within the visualized spine. No worrisome lytic or blastic osseous lesions. No acute osseous abnormality.  IMPRESSION: 1. Edema with swelling within the epiglottis and aryepiglottic folds bilaterally, which may be related to recent intubation. Airway remains widely patent. 2. Subtle asymmetric irregularity along the laryngeal  surface of the left aryepiglottic fold, which may reflect the ulcerative lesion seen on flexible laryngoscopy performed earlier on the same day. There is associated fullness within the adjacent left piriform sinus. 3. Apparent fullness and asymmetry within the right pre epiglottic fat extending inferiorly towards the right false vocal cord. Evaluation of this area is somewhat difficult due to motion artifact. Attention at possible future laryngoscopy/biopsy recommended. 4. Enlarged 2.0 cm necrotic right level IIa node, worrisome for nodal metastasis. An adjacent 8 mm node is also worrisome with suggestion of central necrosis, although is within normal limits for size criteria.   Electronically Signed   By: Jeannine Boga M.D.   On: 05/15/2014 07:30   Ct Angio Neck W/cm &/or Wo/cm  05/10/2014   CLINICAL DATA:  INITIAL ENCOUNTER. CODE STROKE. Patient found unresponsive at 6 o'clock a.m. Last seen well at 1 o'clock a.m.  EXAM: CT ANGIOGRAPHY HEAD AND NECK  TECHNIQUE: Multidetector CT imaging of the head and neck was performed using the standard protocol during bolus administration of intravenous contrast. Multiplanar CT image reconstructions and MIPs were obtained to evaluate the vascular anatomy. Carotid stenosis measurements (when applicable) are obtained utilizing NASCET criteria, using the distal internal carotid diameter as the denominator.  CONTRAST:  67mL OMNIPAQUE IOHEXOL 350 MG/ML SOLN, 29mL OMNIPAQUE IOHEXOL 350 MG/ML SOLN  COMPARISON:  CT head without contrast 05/10/2014.  FINDINGS: CTA HEAD  FINDINGS  The source images demonstrate no acute infarct. The basal ganglia are intact. The postcontrast images demonstrate no pathologic enhancement. The patient is intubated. An NG tube is in place.  Atherosclerotic changes are present within the distal internal carotid arteries bilaterally without significant stenosis. The A1 and M1 segments are normal. The anterior communicating artery is patent. The  bifurcations are intact. There is moderate attenuation of distal MCA branch vessels bilaterally.  The distal left vertebral artery is narrowed P on the dura. Right vertebral artery is intact. The left PICA origin is visualized and normal. The right AICA is dominant. The basilar artery is within normal limits. The left posterior cerebral artery originates from the basilar tip. The right posterior cerebral artery is of fetal type. There is moderate attenuation of PCA branch vessels bilaterally.  Review of the MIP images confirms the above findings.  CTA NECK FINDINGS  A 3 vessel arch configuration is present. Right lateral irregularity and focally ectasia is noted in the proximal innominate artery. The artery measures up to 21 mm in diameter. The proximal vessels are otherwise normal. Atherosclerotic calcifications are present in the aortic arch.  The vertebral artery is slightly dominant to the left. There is focal narrowing of the distal left vertebral artery at the level of C2-2.  The right common carotid artery demonstrates atherosclerotic changes at the bifurcation. There some calcific changes in the proximal right ICA without significant stenosis. The right ICA is tortuous without distal stenosis.  The left common carotid artery is within normal limits. Atherosclerotic changes are noted at the bifurcation and proximal left ICA without significant stenosis. The left ICA is tortuous proximally. The distal left ICA is normal.  The bone windows demonstrate multilevel disc disease with uncovertebral spurring from C3-4 through C6-7. No focal lytic or blastic lesions are present. The lung apices are clear.  Review of the MIP images confirms the above findings.  IMPRESSION: 1. Atherosclerotic changes at the carotid bifurcations bilaterally without significant stenosis. 2. ICA tortuosity is worse right than left without significant stenosis. 3. Focal narrowing of the distal left vertebral artery at the level of C2 and  the dura with a small irregular vessel extending to the vertebrobasilar junction. 4. The basilar artery is intact. 5. Focal ectasia of the innominate artery measuring up to 21 mm just above the aortic arch. 6. Atherosclerotic changes within the cavernous carotid arteries bilaterally without significant stenosis. 7. Moderate distal small vessel disease in the MCA and PCA branch vessels. These results were called by telephone at the time of interpretation on 05/10/2014 at 12:12 pm to Dr. Roland Rack, who verbally acknowledged these results.   Electronically Signed   By: Lawrence Santiago M.D.   On: 05/10/2014 12:44   Ct Angio Chest Pe W/cm &/or Wo Cm  05/10/2014   CLINICAL DATA:  Found on couch unresponsive. Shortness of breath for 3-4 days.  EXAM: CT ANGIOGRAPHY CHEST WITH CONTRAST  TECHNIQUE: Multidetector CT imaging of the chest was performed using the standard protocol during bolus administration of intravenous contrast. Multiplanar CT image reconstructions and MIPs were obtained to evaluate the vascular anatomy.  CONTRAST:  38mL OMNIPAQUE IOHEXOL 350 MG/ML SOLN, 42mL OMNIPAQUE IOHEXOL 350 MG/ML SOLN  COMPARISON:  None  FINDINGS: Mediastinum: The heart size appears normal. No pericardial effusion. The trachea appears patent. There is an endotracheal tube with tip above the carina. Nasogastric tube is identified within the esophagus. There is no mediastinal or hilar adenopathy identified. No supraclavicular or axillary adenopathy.  There is calcified atherosclerotic disease involving the thoracic aorta. Calcified plaque is also noted within the LAD and RCA coronary arteries. Normal appearance of the pulmonary artery. No lobar or segmental filling defects identified to suggest pulmonary embolus.  Lungs/Pleura: There is no pleural effusion identified. Dependent changes noted in the right lung base. No airspace consolidation. Left upper lobe lung nodule measures 8 mm.  Upper Abdomen: Incidental imaging through  the upper abdomen is unremarkable.  Musculoskeletal: Review of the visualized bony structures is significant for thoracic kyphosis. There is multi level degenerative disc disease identified. No acute bone abnormality noted.  Review of the MIP images confirms the above findings.  IMPRESSION: 1. Exam is negative for acute pulmonary embolus. 2. Atherosclerotic disease including multi vessel coronary artery calcification. 3. Left lung nodule measures 8 mm If the patient is at high risk for bronchogenic carcinoma, follow-up chest CT at 3-78months is recommended. If the patient is at low risk for bronchogenic carcinoma, follow-up chest CT at 6-12 months is recommended. This recommendation follows the consensus statement: Guidelines for Management of Small Pulmonary Nodules Detected on CT Scans: A Statement from the [place] as published in Radiology 2005; 237:395-400.   Electronically Signed   By: Kerby Moors M.D.   On: 05/10/2014 12:29   Mr Brain Wo Contrast  05/12/2014   CLINICAL DATA:  58 year old male last seen normal at 1 a.m. on 05/10/2014. Found unresponsive. On admission was unresponsive with bradycardia, and atrial fibrillation. No known past medical history. Improving altered mental status.  EXAM: MRI HEAD WITHOUT CONTRAST  TECHNIQUE: Multiplanar, multiecho pulse sequences of the brain and surrounding structures were obtained without intravenous contrast.  COMPARISON:  CT head 05/10/2014.  CT angio head and neck 05/10/2014.  FINDINGS: Motion degraded exam.  Overall study diagnostic.  Widespread areas of restricted diffusion affecting primarily the BILATERAL globus pallidus, other deep nuclei, and periventricular white matter, predominantly supratentorial, but with involvement of the cerebellum, consistent with a hypoxic/ischemic and/or hypotensive insult. Carbon monoxide poisoning can also have this appearance, but given the punctate deep white matter areas of restricted diffusion, is less  favored. No hemorrhage, mass lesion, hydrocephalus, or extra-axial fluid. Premature for age atrophy. Mild subcortical and periventricular T2 and FLAIR hyperintensities, likely chronic microvascular ischemic change. 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 Flow voids are maintained in the carotid, basilar, and vertebral arteries. There is no obvious head trauma or osseous lesion. Upper cervical region unremarkable.  Compared with prior CT studies, the these changes are not visible.  IMPRESSION: Widespread areas of restricted diffusion involving both the deep nuclei (predominately globus pallidus) as well as the deep white matter in a watershed pattern. Findings are consistent with a hypoxic/ischemic and/or hypertensive insult. Carbon monoxide poisoning has some similar imaging features; see discussion above.   Electronically Signed   By: Rolla Flatten M.D.   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 Daryll Brod, MD  Guidance:  FLUOROSCOPIC  FLUOROSCOPY TIME:  5 MIN 24 SECONDS  MEDICATIONS AND MEDICAL HISTORY: 1 G VANCOMYCIN AND 400 MG CIPROAdministered within 1 hour of the procedure,1.5 MG VERSED, 75 MCG FENTANYL  ANESTHESIA/SEDATION: 60 min  CONTRAST:  73mL OMNIPAQUE IOHEXOL 300 MG/ML  SOLN  COMPLICATIONS: None immediate  PROCEDURE: Informed consent was obtained from the patient following explanation of the procedure, risks, benefits and alternatives. The patient understands, agrees and consents for the procedure. All questions were addressed. A time out was performed.  Maximal barrier sterile technique utilized including caps, mask, sterile gowns, sterile gloves, large sterile drape, hand hygiene,  and betadine prep.  The left upper quadrant was sterilely prepped and draped. An oral gastric catheter was inserted into the stomach under fluoroscopy. The existing nasogastric feeding tube was removed. Air was injected into the stomach for insufflation and visualization under fluoroscopy. The air distended stomach was confirmed beneath the anterior abdominal wall in the frontal and lateral projections. Under sterile conditions and local anesthesia, a 78 gauge trocar needle was utilized to access the stomach percutaneously beneath the left subcostal margin. Needle position was confirmed within the stomach under biplane fluoroscopy. Contrast injection confirmed position also. A single T tack was deployed for gastropexy. Over an Amplatz guide wire, a 9-French sheath was inserted into the stomach. A snare device was utilized to capture the oral gastric catheter. The snare device was pulled retrograde from the stomach up the esophagus and out the oropharynx. The 20-French pull-through gastrostomy was connected to the snare device and pulled antegrade through the oropharynx down the esophagus into the stomach and then through the percutaneous tract external to the patient. The gastrostomy was assembled externally. Contrast injection confirms position in the stomach. Images were obtained for documentation. The patient tolerated procedure well. No immediate complication.  IMPRESSION: Fluoroscopic insertion of a 20-French "pull-through" gastrostomy.   Electronically Signed   By: Daryll Brod M.D.   On: 05/17/2014 16:35   Nm Myocar Multi W/spect W/wall Motion / Ef  05/15/2014   CLINICAL DATA:  58 year old male recently admitted with respiratory failure, aspiration pneumonia and septic shock. Additionally, had atrial fibrillation with rapid ventricular response and elevated troponins thought to be related O2 demand ischemia from the AFib with RVR. Nuclear medicine cardiac stress test warranted to evaluate for reversible  ischemia.  EXAM: MYOCARDIAL IMAGING WITH SPECT (REST AND PHARMACOLOGIC-STRESS)  GATED LEFT VENTRICULAR WALL MOTION STUDY  LEFT VENTRICULAR EJECTION FRACTION  TECHNIQUE: Standard myocardial SPECT imaging was performed after resting intravenous injection of 10 mCi Tc-52m sestamibi. Subsequently, intravenous infusion of Lexiscan was performed under the supervision of the Cardiology staff. At peak effect of the drug, 30 mCi Tc-24m sestamibi was injected intravenously and standard myocardial SPECT imaging was performed. Quantitative gated imaging was also performed to evaluate left ventricular wall motion, and estimate left ventricular ejection fraction.  COMPARISON:  Chest x-ray 05/11/2014  FINDINGS: Perfusion: No decreased activity in the left ventricle on stress imaging to suggest reversible ischemia or infarction. Mildly decreased radiotracer uptake in the inferior wall on both the resting and stress images likely represents diaphragmatic attenuation in a male patient.  Wall Motion: Normal left ventricular wall motion. No left ventricular dilation.  Left Ventricular Ejection Fraction: 61 %  End diastolic volume 76 ml  End systolic volume 29 ml  IMPRESSION: 1. No reversible ischemia or infarction.  2. Normal left ventricular wall motion.  3. Left ventricular ejection fraction 61%  4. Low-risk stress test findings*.  *2012 Appropriate Use Criteria for Coronary Revascularization Focused Update: J Am Coll Cardiol. 3810;17(5):102-585. http://content.airportbarriers.com.aspx?articleid=1201161   Electronically Signed   By: Jacqulynn Cadet M.D.   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  EXAM: ULTRASOUND GUIDED CORE BIOPSY OF RIGHT CERVICAL LYMPHADENOPATHY  MEDICATIONS: 1.0 mg IV Versed; 25 mcg IV Fentanyl  Total Moderate Sedation Time: 13 min  COMPARISON:  CT of the neck on  05/14/2014  PROCEDURE: The procedure, risks, benefits, and alternatives were explained to the patient. Questions regarding the procedure were encouraged and answered. The patient understands and consents to the procedure.  The right neck was prepped with Betadine in a sterile fashion, and a sterile drape was applied covering the operative field. A sterile gown and sterile gloves were used for the procedure. A time-out procedure was performed. Local anesthesia was provided with 1% Lidocaine.  Ultrasound was performed of the right upper neck. After localizing an enlarged lymph node, an 18 gauge core needle biopsy device was utilized in performing core biopsy. A total of 5 samples were obtained and submitted in saline for histologic analysis. Post biopsy imaging was performed by ultrasound.  COMPLICATIONS: None.  FINDINGS: Lobulated and heterogeneous appearing enlarged right upper cervical lymph node corresponds to the partially necrotic appearing level II node by CT. This lymph node measures approximately 2.4 cm in greatest diameter by ultrasound. Solid tissue was obtained from the lymph node.  IMPRESSION: Ultrasound-guided core biopsy performed of enlarged right upper cervical lymph node.   Electronically Signed   By: Aletta Edouard M.D.   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 Daryll Brod, MD  Guidance:  Ultrasound and fluoroscopic  FLUOROSCOPY TIME:  5 min 24 seconds  MEDICATIONS AND MEDICAL HISTORY: 1 g vancomycin and 400 mg Ciproadministered within 1 hour of the procedure.1.5 mg versed, 75 mcg fentanyl  ANESTHESIA/SEDATION: 60 min  CONTRAST:  21mL OMNIPAQUE IOHEXOL 300 MG/ML  SOLN  COMPLICATIONS: None immediate  PROCEDURE: Informed consent was obtained from the  patient following explanation of the procedure, risks, benefits and alternatives. The patient understands, agrees and consents for the procedure. All questions were addressed. A time out was performed.  Maximal barrier sterile technique utilized including caps, mask, sterile gowns, sterile gloves, large sterile drape, hand hygiene, and 2% chlorhexidine scrub.  Under sterile conditions and local anesthesia, right internal jugular micropuncture venous access was performed. Access was performed with ultrasound. Images were obtained for documentation. A guide wire was inserted followed by a transitional dilator. This allowed insertion of a guide wire and catheter into the IVC. Measurements were obtained from the SVC / RA junction back to the right IJ venotomy site. In the right infraclavicular chest, a subcutaneous pocket was created over the second anterior rib. This was done under sterile conditions and local anesthesia. 1% lidocaine with epinephrine was utilized for this. A 2.5 cm incision was made in the skin. Blunt dissection was performed to create a subcutaneous pocket over the right pectoralis major muscle. The pocket was flushed with saline vigorously. There was adequate hemostasis. The port catheter was assembled and checked for leakage. The port catheter was secured in the pocket with two retention sutures. The tubing was tunneled subcutaneously to the right venotomy site and inserted into the SVC/RA junction through a valved peel-away sheath. Position was confirmed with fluoroscopy. Images were obtained for documentation. The patient tolerated the procedure well. No immediate complications. Incisions were closed in a two  layer fashion with 4 - 0 Vicryl suture. Dermabond was applied to the skin. The port catheter was accessed, blood was aspirated followed by saline and heparin flushes. Needle was removed. A dry sterile dressing was applied.  IMPRESSION: Ultrasound and fluoroscopically guided right internal  jugular single lumen power port catheter insertion. Tip in the SVC/RA junction. Catheter ready for use.   Electronically Signed   By: Daryll Brod M.D.   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 Daryll Brod, MD  Guidance:  Ultrasound and fluoroscopic  FLUOROSCOPY TIME:  5 min 24 seconds  MEDICATIONS AND MEDICAL HISTORY: 1 g vancomycin and 400 mg Ciproadministered within 1 hour of the procedure.1.5 mg versed, 75 mcg fentanyl  ANESTHESIA/SEDATION: 60 min  CONTRAST:  15mL OMNIPAQUE IOHEXOL 300 MG/ML  SOLN  COMPLICATIONS: None immediate  PROCEDURE: Informed consent was obtained from the patient following explanation of the procedure, risks, benefits and alternatives. The patient understands, agrees and consents for the procedure. All questions were addressed. A time out was performed.  Maximal barrier sterile technique utilized including caps, mask, sterile gowns, sterile gloves, large sterile drape, hand hygiene, and 2% chlorhexidine scrub.  Under sterile conditions and local anesthesia, right internal jugular micropuncture venous access was performed. Access was performed with ultrasound. Images were obtained for documentation. A guide wire was inserted followed by a transitional dilator. This allowed insertion of a guide wire and catheter into the IVC. Measurements were obtained from the SVC / RA junction back to the right IJ venotomy site. In the right infraclavicular chest, a subcutaneous pocket was created over the second anterior rib. This was done under sterile conditions and local anesthesia. 1% lidocaine with epinephrine was utilized for this. A 2.5 cm incision was made in the skin. Blunt dissection was performed to create a subcutaneous pocket over the right  pectoralis major muscle. The pocket was flushed with saline vigorously. There was adequate hemostasis. The port catheter was assembled and checked for leakage. The port catheter was secured in the pocket with two retention sutures. The tubing was tunneled subcutaneously to the right venotomy site and inserted into the SVC/RA junction through a valved peel-away sheath. Position was confirmed with fluoroscopy. Images were obtained for documentation. The patient tolerated the procedure well. No immediate complications. Incisions were closed in a two layer fashion with 4 - 0 Vicryl suture. Dermabond was applied to the skin. The port catheter was accessed, blood was aspirated followed by saline and heparin flushes. Needle was removed. A dry sterile dressing was applied.  IMPRESSION: Ultrasound and fluoroscopically guided right internal jugular single lumen power port catheter insertion. Tip in the SVC/RA junction. Catheter ready for use.   Electronically Signed   By: Daryll Brod M.D.   On: 05/17/2014 16:32   Dg Chest Port 1 View  05/11/2014   CLINICAL DATA:  Acute respiratory failure with difficult intubation, septic shock and aspiration pneumonia  EXAM: PORTABLE CHEST - 1 VIEW  COMPARISON:  05/10/2014  FINDINGS: The cardiac shadow is mildly enlarged but stable. An endotracheal tube is noted 2.6 cm above the carina. A nasogastric catheter and left jugular central line are again seen and stable. The lungs are well aerated with minimal bibasilar atelectatic changes. No focal confluent infiltrate is  seen. No pneumothorax is noted. No bony abnormality is seen.  IMPRESSION: Tubes and lines as described above.  Bibasilar atelectatic changes.   Electronically Signed   By: Inez Catalina M.D.   On: 05/11/2014 08:02   Dg Chest Port 1 View  05/10/2014   CLINICAL DATA:  58 year old male found unresponsive in home by roommate at 0630 hr, bradycardia on EMS arrival, intubated. Initial encounter.  EXAM: PORTABLE CHEST - 1 VIEW   COMPARISON:  None.  FINDINGS: Portable AP supine view at 0818 hr. Endotracheal tube in good position at the level the clavicles. Enteric tube courses to the abdomen, side hole the level of the gastric body.  Mildly low lung volumes. Normal cardiac size and mediastinal contours. Visualized tracheal air column is within normal limits. No pneumothorax, pulmonary edema, pleural effusion or confluent pulmonary opacity.  IMPRESSION: 1. ET tube and enteric tube in good position. 2. Mildly low lung volumes.  No acute cardiopulmonary abnormality.   Electronically Signed   By: Lars Pinks M.D.   On: 05/10/2014 08:34   Dg Chest Port 1v Same Day  05/10/2014   CLINICAL DATA:  Central catheter placement ; hypoxia  EXAM: PORTABLE CHEST - 1 VIEW SAME DAY  COMPARISON:  Study obtained earlier in the day  FINDINGS: Central catheter tip is in the left innominate vein. Endotracheal tube tip is 4.1 cm above the carina. Nasogastric tube tip and side port are in the stomach. No pneumothorax. There is no edema or consolidation. Heart is upper normal in size with pulmonary vascularity within normal limits. Thoracolumbar levoscoliosis is stable.  IMPRESSION: New central catheter tip is in the left innominate vein. Tube positions as described. No pneumothorax. No lung edema or consolidation.   Electronically Signed   By: Lowella Grip M.D.   On: 05/10/2014 10:48     PERTINENT LAB RESULTS: CBC: No results found for this basename: WBC, HGB, HCT, PLT,  in the last 72 hours CMET CMP     Component Value Date/Time   NA 140 05/19/2014 0335   K 3.8 05/19/2014 0335   CL 100 05/19/2014 0335   CO2 27 05/19/2014 0335   GLUCOSE 86 05/19/2014 0335   BUN 22 05/19/2014 0335   CREATININE 0.90 05/19/2014 0335   CALCIUM 9.3 05/19/2014 0335   PROT 5.9* 05/10/2014 1130   ALBUMIN 2.5* 05/10/2014 1130   AST 64* 05/10/2014 1130   ALT 31 05/10/2014 1130   ALKPHOS 72 05/10/2014 1130   BILITOT 0.3 05/10/2014 1130   GFRNONAA >90 05/19/2014 0335    GFRAA >90 05/19/2014 0335    GFR Estimated Creatinine Clearance: 90.9 ml/min (by C-G formula based on Cr of 0.9). No results found for this basename: LIPASE, AMYLASE,  in the last 72 hours No results found for this basename: CKTOTAL, CKMB, CKMBINDEX, TROPONINI,  in the last 72 hours No components found with this basename: POCBNP,  No results found for this basename: DDIMER,  in the last 72 hours No results found for this basename: HGBA1C,  in the last 72 hours No results found for this basename: CHOL, HDL, LDLCALC, TRIG, CHOLHDL, LDLDIRECT,  in the last 72 hours No results found for this basename: TSH, T4TOTAL, FREET3, T3FREE, THYROIDAB,  in the last 72 hours No results found for this basename: VITAMINB12, FOLATE, FERRITIN, TIBC, IRON, RETICCTPCT,  in the last 72 hours Coags: No results found for this basename: PT, INR,  in the last 72 hours Microbiology: Recent Results (from the past 240 hour(s))  URINE CULTURE     Status: None   Collection Time    05/10/14  8:17 AM      Result Value Ref Range Status   Specimen Description URINE, CATHETERIZED   Final   Special Requests NONE   Final   Culture  Setup Time     Final   Value: 05/10/2014 12:58     Performed at [facility]     Final   Value: NO GROWTH     Performed at [facility]   Culture     Final   Value: NO GROWTH     Performed at [facility]   Report Status 05/11/2014 FINAL   Final  CULTURE, BLOOD (ROUTINE X 2)     Status: None   Collection Time    05/10/14  8:21 AM      Result Value Ref Range Status   Specimen Description BLOOD LEFT WRIST   Final   Special Requests BOTTLES DRAWN AEROBIC ONLY 2MLS   Final   Culture  Setup Time     Final   Value: 05/10/2014 12:55     Performed at [facility]   Culture     Final   Value: NO GROWTH 5 DAYS     Note: Culture results may be compromised due to an inadequate volume of blood received in culture bottles.     Performed at [facility]   Report Status 05/16/2014 FINAL   Final  CULTURE, BLOOD (ROUTINE X 2)     Status: None   Collection Time    05/10/14  8:35 AM      Result Value Ref Range Status   Specimen Description BLOOD RIGHT FOREARM   Final   Special Requests BOTTLES DRAWN AEROBIC ONLY 3MLS   Final   Culture  Setup Time     Final   Value: 05/10/2014 12:54     Performed at [facility]   Culture     Final   Value: NO GROWTH 5 DAYS     Performed at [facility]   Report Status 05/16/2014 FINAL   Final  CULTURE, RESPIRATORY (NON-EXPECTORATED)     Status: None   Collection Time    05/10/14 11:30 AM      Result Value Ref Range Status   Specimen Description TRACHEAL ASPIRATE   Final   Special Requests NONE   Final   Gram Stain     Final   Value: FEW WBC PRESENT,BOTH PMN AND MONONUCLEAR     RARE SQUAMOUS EPITHELIAL CELLS PRESENT     NO ORGANISMS SEEN     Performed at [facility]   Culture     Final   Value: MODERATE STREPTOCOCCUS,BETA HEMOLYTIC NOT GROUP A     Performed at [facility]   Report Status 05/13/2014 FINAL   Final  MRSA PCR SCREENING     Status: None   Collection Time    05/10/14 12:54 PM      Result Value Ref Range Status   MRSA by PCR NEGATIVE  NEGATIVE Final   Comment:            The GeneXpert MRSA Assay (FDA     approved for NASAL specimens     only), is one component of a     comprehensive MRSA colonization     surveillance program. It is not     intended to diagnose MRSA  infection nor to guide or     monitor treatment for     MRSA infections.     BRIEF HOSPITAL COURSE:  Brief summary  Patient is a 58 year old male with a history of smoking, obesity, who developed hoarseness of voice a month prior to this admission, admitted on 10/1 as he was found unresponsive by his girlfriend. Patient was intubated, and admitted to the intensive care unit.During his hospital course, he has had episodes of bradycardia, A. Fib with RVR as well.Further  workup has demonstrated a abnormal MRI and a possible seizure focus on his EEG. 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 Has undergone PEG tube and Port-A-Cath placement. Has been evaluated by oncology, radiation oncology, ENT, case has been discussed in tumor Board- recommendations are to begin chemotherapy radiation-patient chooses to receive this treatment at [place] [facility].Dr Alvy Bimler has referred the patient to [facility] for further continued care.  See below for hospital course by problem list Respiratory failure, acute  -Secondary to failure to protect airway. Intubated on Admission, extubated and transferred to [facility] hospitalist. Doing well-on room air  Acute encephalopathy  - Secondary to hypoxic/ischemic insult. MRI brain showed widespread areas of restricted diffusion involving both deep nuclei, and deep white matter in a watershed pattern. Mental status has improved by day of discharge-patient is completely awake and alert and ambulating in the hallway.Per neurology, patient may have some long-term cognitive issues. He has developed a tremor, likely post ischemic.   Suspected seizures  - Further workup since hospitalization- initial EEG per neurology showed irritability-started on Keppra, neurology recommended on the 7 days of Keppra-stop date 05/17/14-now no longer on Keppra  Laryngeal cancer-Squamous Cell Cancer by bx - Patient apparently developed hoarseness of voice a few months prior to this admission. A long-standing history of smoking. Further workup, an ENT consultation (Dr Emily Filbert laryngoscopy showed ulceration visible on the laryngeal surface of the left area epiglottic fold.satus post ultrasound-guided core biopsy of a right cervical lymph node-Pathology confirms metastatic sq cell cancer. Spoke with Oncology-patient will be followed by Oncology group in [place], Dr Alvy Bimler will arrange. G tube and  Porta cath placed on 05/18/14. Defer timing of tracheostomy to ENT as outpatient. Suggested to patient that he make a outpatient appt with Dr Erik Obey.  Dysphagia  -Secondary to laryngeal cancer,? Encephalopathy  - Speech therapy following, modified barium swallow done on 05/14/14-continue n.p.o status on discharge. G Tube placed on 10/8-started peg feeds-case management has arranged for Home health services  AKI, anion gap acidosis  - resolved.  Sinus bradycardia and atrial Tachycardia  - Cardiology and EP consulted, monitored in Telemetry -Reviewed cardiology notes-with newly diagnosed malignancy-not a candidate for anticoagulation. Currently on aspirin.  Elevated troponins  -secondary to demand ischemia  -Seen by cardiology, nuclear stress test negative for ischemia.   TODAY-DAY OF DISCHARGE:  Subjective:   Barnes Florek today has no headache,no chest abdominal pain,no new weakness tingling or numbness, feels much better wants to go home today.  Objective:   Blood pressure 105/75, pulse 77, temperature 98 F (36.7 C), temperature source Oral, resp. rate 18, height 5\' 5"  (1.651 m), weight 82.872 kg (182 lb 11.2 oz), SpO2 96.00%.  Intake/Output Summary (Last 24 hours) at 05/19/14 1119 Last data filed at 05/19/14 0800  Gross per 24 hour  Intake   1247 ml  Output      0 ml  Net   1247 ml   Danley Danker  Weights   05/17/14 0226 05/18/14 0453 05/19/14 0659  Weight: 78.064 kg (172 lb 1.6 oz) 80.423 kg (177 lb 4.8 oz) 82.872 kg (182 lb 11.2 oz)    Exam Awake Alert, Oriented *3, No new F.N deficits, Normal affect [place].AT,PERRAL Supple Neck,No JVD, No cervical lymphadenopathy appriciated.  Symmetrical Chest wall movement, Good air movement bilaterally, CTAB RRR,No Gallops,Rubs or new Murmurs, No Parasternal Heave +ve B.Sounds, Abd Soft, Non tender, No organomegaly appriciated, No rebound -guarding or rigidity. No Cyanosis, Clubbing or edema, No new Rash or bruise  DISCHARGE  CONDITION: Stable  DISPOSITION: Home with home health services  DISCHARGE INSTRUCTIONS:    Activity:  As tolerated with Full fall precautions use walker/cane & assistance as needed  Diet recommendation: NPO-peg feeds only  Discharge Instructions   Call MD for:  difficulty breathing, headache or visual disturbances    Complete by:  As directed      Diet - low sodium heart healthy    Complete by:  As directed      Increase activity slowly    Complete by:  As directed            Follow-up Information   Follow up with [facility]     On 05/25/2014. (12:15 for hospital follow up apt)    Contact information:   [place] [address] [phone]      Follow up with [facility]. (please call office to schedule appt -Dr Alvy Bimler has referred you))    Contact information:   [place]  [place], [place], [place] Tel-[phone]      Follow up with [facility], NI, MD. (please call office-only if you have difficulty setting up appt at cancer center at [facility])    Specialty:  Hematology and Oncology   Contact information:   [place] [address] [phone]       Follow up with Jodi Marble, MD. Schedule an appointment as soon as possible for a visit in 1 week. (ENT-make appt for further work up)    Specialty:  Visual merchandiser information:   [employer] [address] [place] [address] [phone]       Follow up with [facility] S., MD. Schedule an appointment as soon as possible for a visit in 1 week. (Radiation Oncology)    Contact information:   [place]  [address] [phone]      Total Time spent on discharge equals 45 minutes.  SignedOren Binet 05/19/2014 11:19 AM

## 2014-05-21 ENCOUNTER — Telehealth: Payer: Self-pay | Admitting: Hematology and Oncology

## 2014-05-21 NOTE — Telephone Encounter (Signed)
Faxed pt medical records to [facility].  Pt will see Dr. Grayland Ormond.

## 2014-05-22 ENCOUNTER — Telehealth: Payer: Self-pay | Admitting: Hematology and Oncology

## 2014-05-22 NOTE — Telephone Encounter (Signed)
Pt appt. To see Dr. Grayland Ormond is 05/28/14@2 :00

## 2014-05-25 ENCOUNTER — Ambulatory Visit: Payer: Self-pay | Attending: Internal Medicine | Admitting: Internal Medicine

## 2014-05-25 VITALS — BP 112/73 | HR 85 | Temp 98.4°F | Ht 65.0 in | Wt 179.0 lb

## 2014-05-25 DIAGNOSIS — C329 Malignant neoplasm of larynx, unspecified: Secondary | ICD-10-CM

## 2014-05-25 DIAGNOSIS — Z23 Encounter for immunization: Secondary | ICD-10-CM

## 2014-05-25 NOTE — Progress Notes (Unsigned)
Patient presents with followup of larengyl cancer.   No pain.  Has feeding tube. Has right side portacath.

## 2014-05-25 NOTE — Progress Notes (Unsigned)
Patient ID: Donald Hughes, male   DOB: 07-Jun-1956, 58 y.o.   MRN: [id number]  Patient Demographics  Donald Hughes, is a 58 y.o. male  RWE:[id number]  YPP:[id number]  DOB - [birth year]  Chief Complaint  Patient presents with  . larengeal mass    biopsy of mass was malignant.        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 pneumonia, had seizure x1 during hospitalization was seen by neurology was placed and then taken off of Keppra prior to discharge, atrial fibrillation in the hospital, history of smoking now quit, is here for post hospital followup visit. No acute issues.  Denies any subjective complaints except as above, no active headache, no chest abdominal pain at this time, not short of breath. No focal weakness which is new.   Objective:    Patient Active Problem List   Diagnosis Date Noted  . 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 Demand ischemia 05/13/2014  . Respiratory failure, acute 05/10/2014  . Altered mental status 05/10/2014  . Septic shock 05/10/2014  . Aspiration pneumonia 05/10/2014  . Atrial fibrillation 05/10/2014  . Elevated troponin 05/10/2014     Filed Vitals:   05/25/14 1242  BP: 112/73  Pulse: 85  Temp: 98.4 F (36.9 C)  TempSrc: Oral  Height: 5\' 5"  (1.651 m)  Weight: 179 lb (81.194 kg)  SpO2: 96%     Exam   Awake Alert, Oriented X 3, No new F.N deficits, Normal affect [place].AT,PERRAL Supple Neck,No JVD, No cervical lymphadenopathy appriciated.  Symmetrical Chest wall movement, Good air movement bilaterally, CTAB RRR,No Gallops,Rubs or new Murmurs, No Parasternal Heave +ve B.Sounds, Abd Soft, Non tender, No organomegaly appriciated, No rebound - guarding or rigidity. PEG Tube in place site clean No Cyanosis, Clubbing or edema, No new Rash or bruise       Data Review   Lab Results  Component Value Date    WBC 8.8 05/16/2014   HGB 14.4 05/16/2014   HCT 45.3 05/16/2014   MCV 77.7* 05/16/2014   PLT 193 05/16/2014      Chemistry      Component Value Date/Time   NA 140 05/19/2014 0335   K 3.8 05/19/2014 0335   CL 100 05/19/2014 0335   CO2 27 05/19/2014 0335   BUN 22 05/19/2014 0335   CREATININE 0.90 05/19/2014 0335      Component Value Date/Time   CALCIUM 9.3 05/19/2014 0335   ALKPHOS 72 05/10/2014 1130   AST 64* 05/10/2014 1130   ALT 31 05/10/2014 1130   BILITOT 0.3 05/10/2014 1130       Lab Results  Component Value Date   HGBA1C 5.8* 05/10/2014    Lab Results  Component Value Date   CHOL 117 05/13/2014   HDL 26* 05/13/2014   LDLCALC 61 05/13/2014   TRIG 148 05/13/2014   CHOLHDL 4.5 05/13/2014    Lab Results  Component Value Date   TSH 0.739 05/11/2014    No results found for this basename: PSA      Prior to Admission medications   Medication Sig Start Date End Date Taking? Authorizing Provider  albuterol (PROVENTIL HFA;VENTOLIN HFA) 108 (90 BASE) MCG/ACT inhaler Inhale 2 puffs into the lungs every 6 (six) hours as needed for wheezing or shortness of breath. 05/19/14  Yes Shanker Kristeen Mans, MD  aspirin 81 MG chewable tablet Place 1 tablet (81 mg total) into  feeding tube daily. 05/19/14  Yes Shanker Kristeen Mans, MD  lisinopril (PRINIVIL,ZESTRIL) 10 MG tablet Place 1 tablet (10 mg total) into feeding tube daily. 05/20/14  Yes Shanker Kristeen Mans, MD  Nutritional Supplements (FEEDING SUPPLEMENT, OSMOLITE 1.2 CAL,) LIQD Place 360 mLs into feeding tube 5 (five) times daily. 05/19/14  Yes Shanker Kristeen Mans, MD     Assessment & Plan    1. Recent diagnosis of metastatic squamous cell laryngeal cancer causing dysphagia requiring PEG tube placement. He has a pending appointment with Dr. Grayland Ormond oncologist in [place] on 05/28/2014 at 2 PM. He also will make an appointment to [facility]. [facility] today, he will find a PCP or come to this clinic in one month's time. His home nurse and  speech therapy coming on a regular basis this will be continued. Or diet and medications via PEG tube.    2. Smoking. He has quit smoking counseled to continue abstaining.    3. Seizure x1 in the hospital. Seen by neurology was placed and taken off of Keppra while in the hospital.    4. Questionable atrial tachycardia versus atrial fibrillation during recent hospitalization. Was seen by cardiology, placed on aspirin only due to ongoing diagnosis of laryngeal cancer, considered not to be a anticoagulation candidate by cardiology. Currently appears to be in sinus.    5. Recent elevated troponin  During recent hospitalization. Seen by cardiology, this was not secondary to ischemia, likely due to comminution of aspiration pneumonia and respiratory distress causing demand ischemia. Underwent nuclear stress test which was unremarkable.   6. Acute respiratory failure during hospitalization requiring intubation from aspiration pneumonia. There was suspicion of some ischemic injury on the MRI as well, was extubated, was at his baseline prior to discharge and continues to be so. No focal deficits. Currently maintaining airway. Now on PEG tube diet and feeds. Currently n.p.o. by mouth. Continue followup by speech therapist at home. Also seeing ENT shortly.     Routine health maintenance.  He received flu shot, pneumonia shot, referral to GI for Phalen's colonoscopy.   Thurnell Lose M.D on 05/25/2014 at 12:56 PM    Patient was given clear instructions to go to ER or return to the clinic if symptoms don't improve, worsen or new problems develop. Patient verbalized understanding. Patient was told to call to get lab results if hasn't heard anything in the next week.     **Disclaimer: This note may have been dictated with voice recognition software. Similar sounding words can inadvertently be transcribed and this note may contain transcription errors which may not have been corrected upon  publication of note.**

## 2014-05-25 NOTE — Patient Instructions (Addendum)
Keep your current at [facility] with Dr. Grayland Ormond is 05/28/14 @ 2:00 pm -  Office [phone]. Address -  [address] [place] [phone]  Follow with Primary MD  in 3-4 weeks  Get CBC, CMP, 2 view Chest X ray checked  next visit.    Activity: As tolerated with Full fall precautions use walker/cane & assistance as needed   Disposition Home     Diet: Diet via PEG tube as before.  If you experience worsening of your admission symptoms, develop shortness of breath, life threatening emergency, suicidal or homicidal thoughts you must seek medical attention immediately by calling 911 or calling your MD immediately  if symptoms less severe.  You Must read complete instructions/literature along with all the possible adverse reactions/side effects for all the Medicines you take and that have been prescribed to you. Take any new Medicines after you have completely understood and accpet all the possible adverse reactions/side effects.   Do not take more than prescribed Pain, Sleep and Anxiety Medications  Special Instructions: If you have smoked or chewed Tobacco  in the last 2 yrs please stop smoking, stop any regular Alcohol  and or any Recreational drug use.  Wear Seat belts while driving.

## 2014-05-31 ENCOUNTER — Ambulatory Visit: Payer: Self-pay | Admitting: Oncology

## 2014-06-04 ENCOUNTER — Ambulatory Visit: Payer: Self-pay | Admitting: Radiation Oncology

## 2014-06-10 ENCOUNTER — Ambulatory Visit: Payer: Self-pay | Admitting: Oncology

## 2014-06-14 LAB — CBC CANCER CENTER
BASOS ABS: 0.1 x10 3/mm (ref 0.0–0.1)
Basophil %: 1.1 %
EOS PCT: 2.8 %
Eosinophil #: 0.3 x10 3/mm (ref 0.0–0.7)
HCT: 43.4 % (ref 40.0–52.0)
HGB: 13.7 g/dL (ref 13.0–18.0)
LYMPHS PCT: 13.8 %
Lymphocyte #: 1.3 x10 3/mm (ref 1.0–3.6)
MCH: 25.1 pg — ABNORMAL LOW (ref 26.0–34.0)
MCHC: 31.5 g/dL — AB (ref 32.0–36.0)
MCV: 80 fL (ref 80–100)
Monocyte #: 1 x10 3/mm (ref 0.2–1.0)
Monocyte %: 10.6 %
Neutrophil #: 6.5 x10 3/mm (ref 1.4–6.5)
Neutrophil %: 71.7 %
Platelet: 236 x10 3/mm (ref 150–440)
RBC: 5.46 10*6/uL (ref 4.40–5.90)
RDW: 22.2 % — AB (ref 11.5–14.5)
WBC: 9.1 x10 3/mm (ref 3.8–10.6)

## 2014-06-14 LAB — COMPREHENSIVE METABOLIC PANEL
ALBUMIN: 3.5 g/dL (ref 3.4–5.0)
Alkaline Phosphatase: 96 U/L
Anion Gap: 8 (ref 7–16)
BUN: 15 mg/dL (ref 7–18)
Bilirubin,Total: 0.4 mg/dL (ref 0.2–1.0)
CALCIUM: 9.9 mg/dL (ref 8.5–10.1)
CHLORIDE: 101 mmol/L (ref 98–107)
Co2: 31 mmol/L (ref 21–32)
Creatinine: 0.9 mg/dL (ref 0.60–1.30)
Glucose: 104 mg/dL — ABNORMAL HIGH (ref 65–99)
Osmolality: 281 (ref 275–301)
Potassium: 4.3 mmol/L (ref 3.5–5.1)
SGOT(AST): 19 U/L (ref 15–37)
SGPT (ALT): 21 U/L
SODIUM: 140 mmol/L (ref 136–145)
TOTAL PROTEIN: 7.6 g/dL (ref 6.4–8.2)

## 2014-06-21 LAB — CBC CANCER CENTER
BASOS ABS: 0.1 x10 3/mm (ref 0.0–0.1)
Basophil %: 0.8 %
Eosinophil #: 0.2 x10 3/mm (ref 0.0–0.7)
Eosinophil %: 2.1 %
HCT: 41.4 % (ref 40.0–52.0)
HGB: 13.4 g/dL (ref 13.0–18.0)
LYMPHS ABS: 1 x10 3/mm (ref 1.0–3.6)
LYMPHS PCT: 9.7 %
MCH: 25.8 pg — ABNORMAL LOW (ref 26.0–34.0)
MCHC: 32.4 g/dL (ref 32.0–36.0)
MCV: 80 fL (ref 80–100)
Monocyte #: 1 x10 3/mm (ref 0.2–1.0)
Monocyte %: 9.5 %
NEUTROS ABS: 8.2 x10 3/mm — AB (ref 1.4–6.5)
Neutrophil %: 77.9 %
PLATELETS: 218 x10 3/mm (ref 150–440)
RBC: 5.21 10*6/uL (ref 4.40–5.90)
RDW: 22.1 % — ABNORMAL HIGH (ref 11.5–14.5)
WBC: 10.5 x10 3/mm (ref 3.8–10.6)

## 2014-06-21 LAB — BASIC METABOLIC PANEL
Anion Gap: 7 (ref 7–16)
BUN: 17 mg/dL (ref 7–18)
CREATININE: 0.85 mg/dL (ref 0.60–1.30)
Calcium, Total: 9.4 mg/dL (ref 8.5–10.1)
Chloride: 100 mmol/L (ref 98–107)
Co2: 34 mmol/L — ABNORMAL HIGH (ref 21–32)
EGFR (Non-African Amer.): >60
Glucose: 133 mg/dL — ABNORMAL HIGH (ref 65–99)
Osmolality: 285 (ref 275–301)
Potassium: 4.3 mmol/L (ref 3.5–5.1)
Sodium: 141 mmol/L (ref 136–145)

## 2014-06-28 LAB — CBC CANCER CENTER
BASOS ABS: 0.1 x10 3/mm (ref 0.0–0.1)
BASOS PCT: 0.5 %
EOS ABS: 0.2 x10 3/mm (ref 0.0–0.7)
EOS PCT: 1.6 %
HCT: 43.6 % (ref 40.0–52.0)
HGB: 13.8 g/dL (ref 13.0–18.0)
LYMPHS PCT: 9.8 %
Lymphocyte #: 1.1 x10 3/mm (ref 1.0–3.6)
MCH: 25.9 pg — AB (ref 26.0–34.0)
MCHC: 31.6 g/dL — ABNORMAL LOW (ref 32.0–36.0)
MCV: 82 fL (ref 80–100)
MONO ABS: 1 x10 3/mm (ref 0.2–1.0)
Monocyte %: 8.8 %
NEUTROS ABS: 9.2 x10 3/mm — AB (ref 1.4–6.5)
Neutrophil %: 79.3 %
Platelet: 243 x10 3/mm (ref 150–440)
RBC: 5.32 10*6/uL (ref 4.40–5.90)
RDW: 22.4 % — AB (ref 11.5–14.5)
WBC: 11.6 x10 3/mm — AB (ref 3.8–10.6)

## 2014-06-28 LAB — BASIC METABOLIC PANEL
ANION GAP: 4 — AB (ref 7–16)
BUN: 18 mg/dL (ref 7–18)
CHLORIDE: 99 mmol/L (ref 98–107)
CREATININE: 0.77 mg/dL (ref 0.60–1.30)
Calcium, Total: 9.6 mg/dL (ref 8.5–10.1)
Co2: 34 mmol/L — ABNORMAL HIGH (ref 21–32)
EGFR (African American): >60
EGFR (Non-African Amer.): >60
Glucose: 103 mg/dL — ABNORMAL HIGH (ref 65–99)
Osmolality: 276 (ref 275–301)
Potassium: 4.3 mmol/L (ref 3.5–5.1)
Sodium: 137 mmol/L (ref 136–145)

## 2014-07-10 ENCOUNTER — Ambulatory Visit: Payer: Self-pay | Admitting: Oncology

## 2014-12-01 NOTE — Consult Note (Signed)
Reason for Visit: This 59 year old Male patient presents to the clinic for initial evaluation of  head and neck cancer .   Referred by Dr.Gorsuch.  Diagnosis:  Chief Complaint/Diagnosis   59 year old male was yet not completely staged head and neck cancer for consideration of concurrent chemoradiation patient's current stage is at least IVa (T4a N1 MX)  Pathology Report pathology report reviewed   Imaging Report CT scans requested for my review PET CT scan ordered   Referral Report clinical notes reviewed   Planned Treatment Regimen probable combined modality treatment chemoradiation for locally advanced disease   HPI   patient is a 59 year old male admitted to [place] on October 1 with acute respiratory failure and cardiac arrest due to possible aspiration pneumonia and septic shock. He had an acute CVA secondary to severe hypertension. He has a history of 4-6 weeks of increasing hoarseness. He also has approximate 20 pound weight loss over the past month. CT scan of the head and neck region showed a large 2 cm necrotic right mid level IIA node. This was eventually biopsied and positive for metastatic squamous cell carcinoma. He also had an 8 mm leftlung nodule which recommendation was to continue imaging. CT scan also demonstrated laryngeal edema and possible thyroid cartilage involvement. He underwent upper endoscopy showing visible ulceration of laryngeal surface of the left aryepiglottic fold. The patient's overall general condition significantly impred. He is now referred to radiation oncology and medical oncology for opinion. He has had a PEG tube placed as well as a Port-A-Cath placed. He continues to have some cysts dysphasia which may be due to encephalopathy. He is having no headache pain at this time.  Past Hx:    Bells Palsy:    HTN:   Past, Family and Social History:  Past Medical History positive   Cardiovascular hypertension; sinus bradycardia and atrial  tachycardia   Respiratory obstructive sleep apnea   Gastrointestinal dysphasia   Neurological/Psychiatric suspected seizure disorder, acute encephalopathy   Past Surgical History PEG tube placement and Port-A-Cath placement   Family History noncontributory   Social History positive   Social History Comments 30 pack year smoking history, no EtOH abuse history   Additional Past Medical and Surgical History a copy of a family member today   Allergies:   No Known Allergies:   Home Meds:  Home Medications: Medication Instructions Status  Compazine tablet 10 mg 1 tab(s) orally every 6 hours x 30 days as needed   Active  albuterol 90 mcg/inh inhalation powder 2 puff(s) inhaled every 4 hours, As Needed - for Shortness of Breath Active  aspirin 81 mg oral tablet 1 tab(s) orally once a day Active  lisinopril 10 mg oral tablet 1 tab(s) orally once a day Active  osmolite 1.2 cal 1 dose through PEG 5 times daily Active   Review of Systems:  General negative   Performance Status (ECOG) 0   Skin negative   Breast negative   Ophthalmologic negative   ENMT see HPI   Respiratory and Thorax see HPI   Cardiovascular see HPI   Gastrointestinal see HPI   Genitourinary negative   Musculoskeletal negative   Neurological negative   Psychiatric negative   Hematology/Lymphatics negative   Endocrine negative   Allergic/Immunologic negative   Nursing Notes:  Nursing Vital Signs and Chemo Nursing Nursing Notes: *CC Vital Signs Flowsheet:   22-Oct-15 11:02  Temp Temperature 97.8  Pulse Pulse 88  Respirations Respirations 20  SBP SBP 154  DBP DBP 91  Pain Scale (0-10)  0  Current Weight (kg) (kg) 82.8  Height (cm) centimeters 152.4  BSA (m2) 1.7   Physical Exam:  General/Skin/HEENT:  General normal   Skin normal   Eyes normal   Additional PE a well-developed male in NAD. Patient is edentulous. Oral cavity is clear no oral mucosal lesions are identified. Indirect  mirror examination shows upper airway clear vallecula base of tongue within normal limits. There is some fullness in the left aryepiglottic fold. Is approximately 2 cm right level II node compatible with above-stated history. No supraclavicular adenopathy is identified a left neck is clear. Lungs are clear to A&P cardiac examination shows regular rate and rhythm.   Breasts/Resp/CV/GI/GU:  Respiratory and Thorax normal   Cardiovascular normal   Gastrointestinal normal   Genitourinary normal   MS/Neuro/Psych/Lymph:  Musculoskeletal normal   Neurological normal   Relevent Results:   Relevant Scans and Labs PET CT scan is ordered   Assessment and Plan: Impression:   locally advanced head and neck cancer in 59 year old male for  concurrent chemoradiation with curative intent. Plan:   at this point in order to complete his staging workup with a PET CT scan which I have ordered. Idiscussed the case personally with medical oncology. Apparently we are dealing with a locally advanced head and neck cancer of the larynx i.e. supraglottic larynx. At least stage IVA ((T4aN58 MX) in 14 -year-old male.he will have his PET CT scan early next week. I have set up and ordered a CT simulation for the patient. Would plan on delivering 7000 cGy to areas of primary tumor involvement and neck involvement and 5400 cGy to his noninvolved head and neck lymph nodes using IM RT dose painting technique. Risks and benefits of treatment including xerostomia, loss of taste, oral mucositis, increased dysphasia, skin reaction, alteration of blood counts,I would use IIMRT to avoid specific dose to his parotid glands, oral cavity and spinal cord. I have discussed the case personally with medical oncology. We'll also continue to monitor his left lung mass as this may be a separate new primary lung cancer with serial CT scans.  I would like to take this opportunity for allowing me to participate in the care of your  patient..  Fax to Physician:  Phys[facility] To Recieve Fax: [place], [place].  Electronic Signatures: Armstead Peaks (MD)  (Signed 22-Oct-15 12:08)  Authored: HPI, Diagnosis, Past Hx, PFSH, Allergies, Home Meds, ROS, Nursing Notes, Physical Exam, Relevent Results, Encounter Assessment and Plan, Fax to Physician   Last Updated: 22-Oct-15 12:08 by Armstead Peaks (MD)

## 2014-12-24 IMAGING — US US BIOPSY
1 series · 11 of 11 positions shown · non-contrast
Comparison: CT of the neck on 05/14/2014

CLINICAL DATA: Enlarged right cervical level II lymphadenopathy and
probable supraglottic laryngeal carcinoma by direct visualization.

EXAM:
ULTRASOUND GUIDED CORE BIOPSY OF RIGHT CERVICAL LYMPHADENOPATHY
MEDICATIONS:
1.0 mg IV Versed; 25 mcg IV Fentanyl
Total Moderate Sedation Time: 13 min

[Series 1: us biopsy · 0.07mm/px · 11 of 11 slices shown]
[im 1/11]
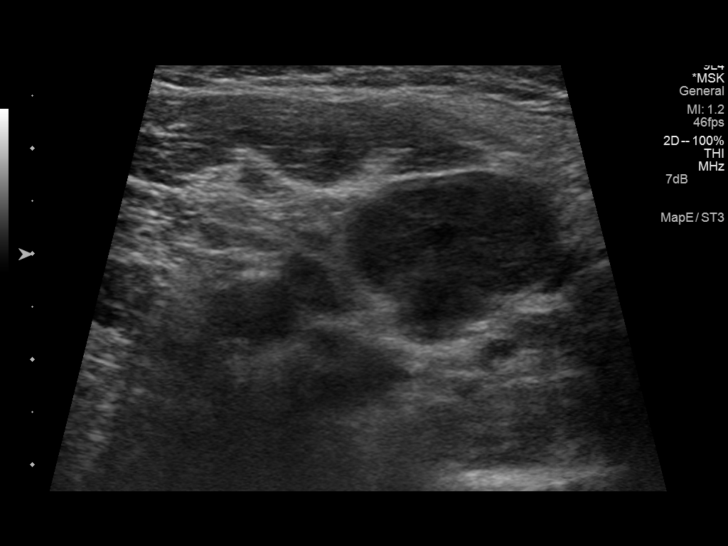
[im 2/11]
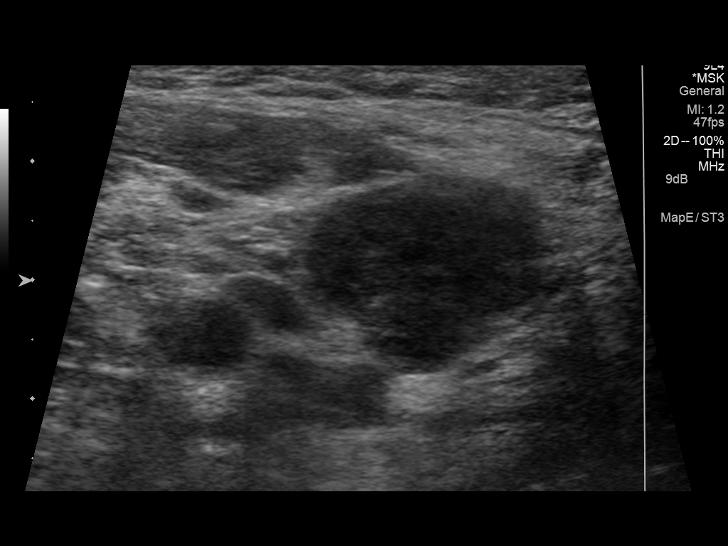
[im 3/11]
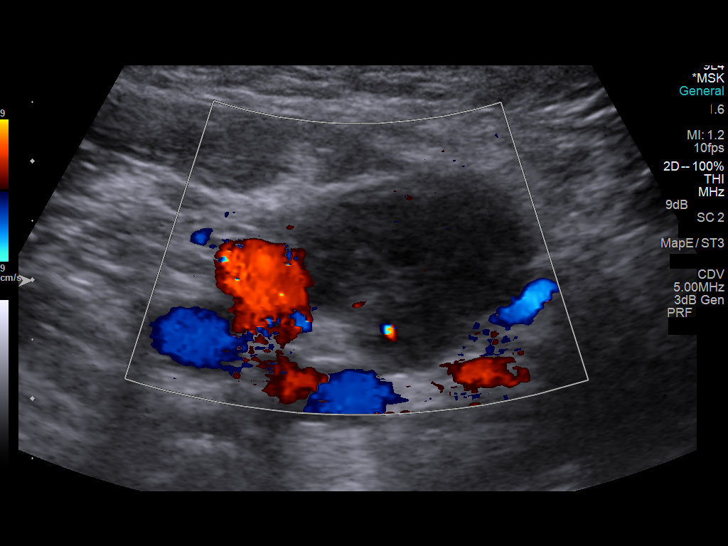
[im 4/11]
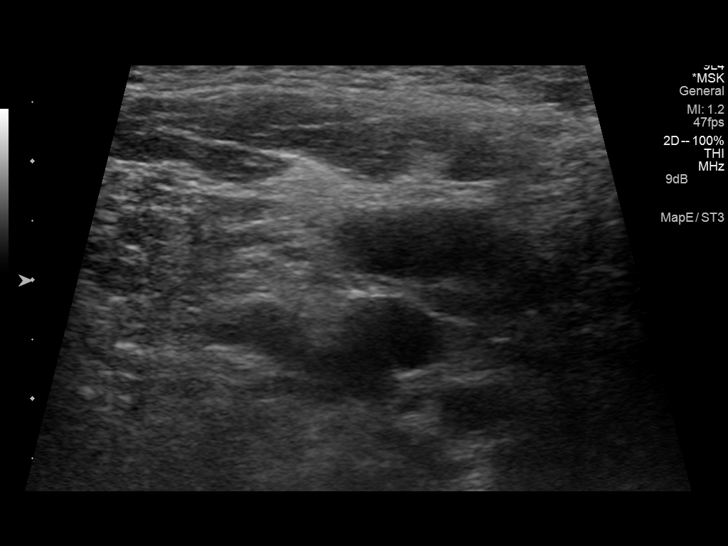
[im 5/11]
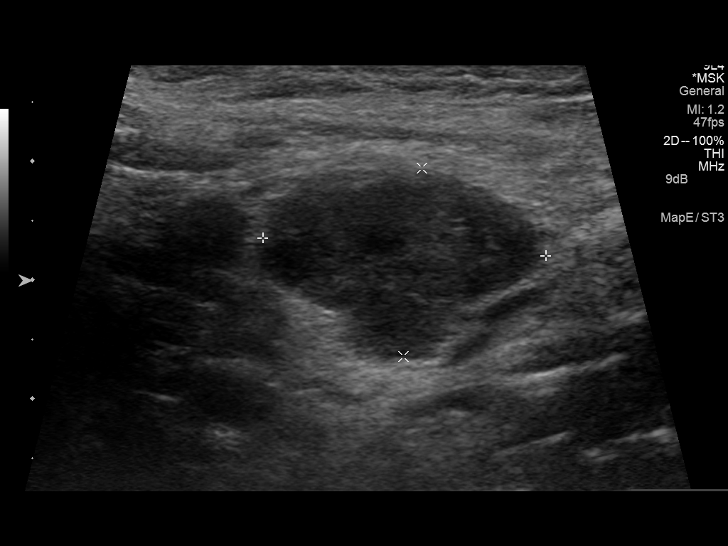
[im 6/11]
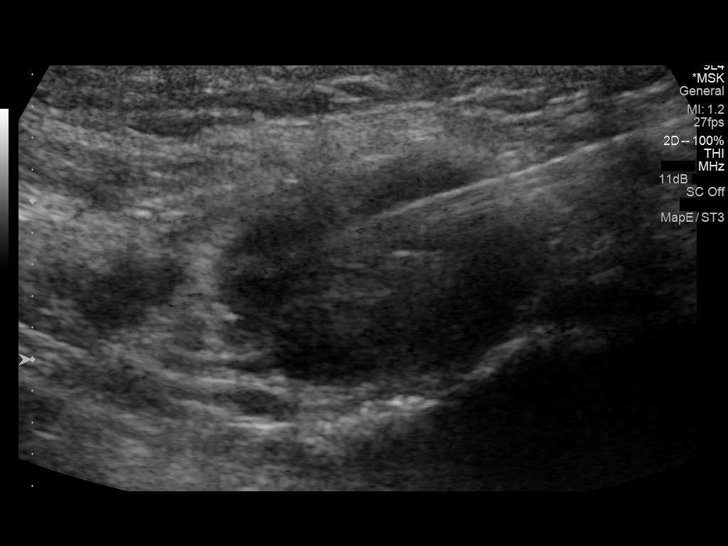
[im 7/11]
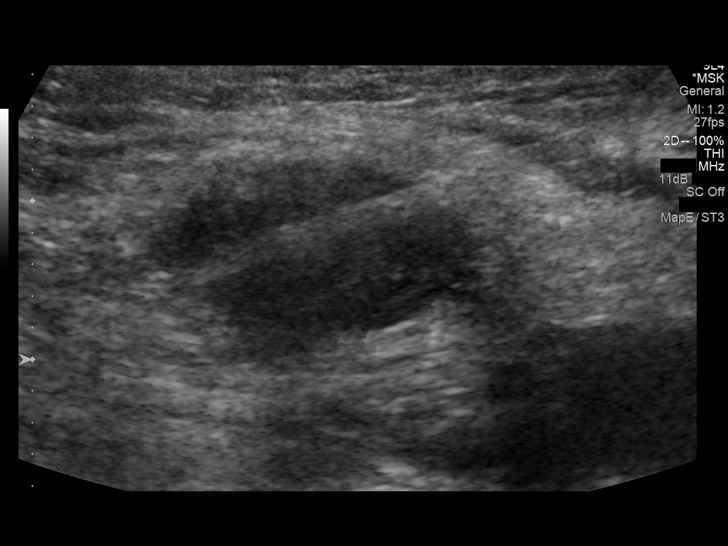
[im 8/11]
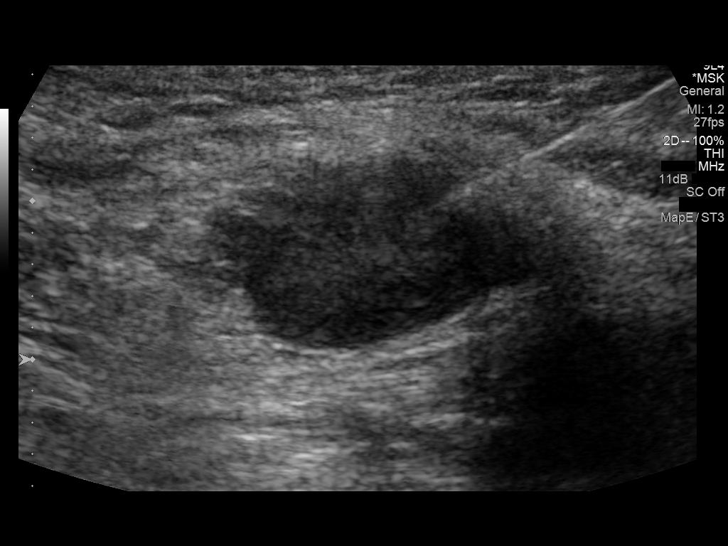
[im 9/11]
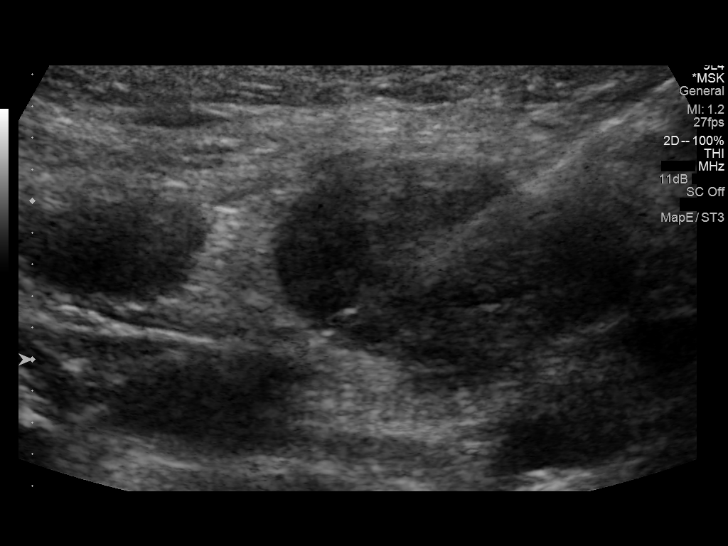
[im 10/11]
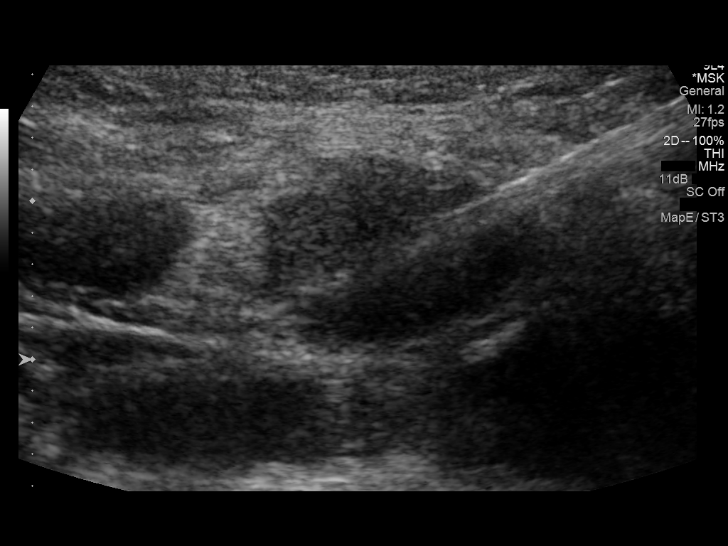
[im 11/11]
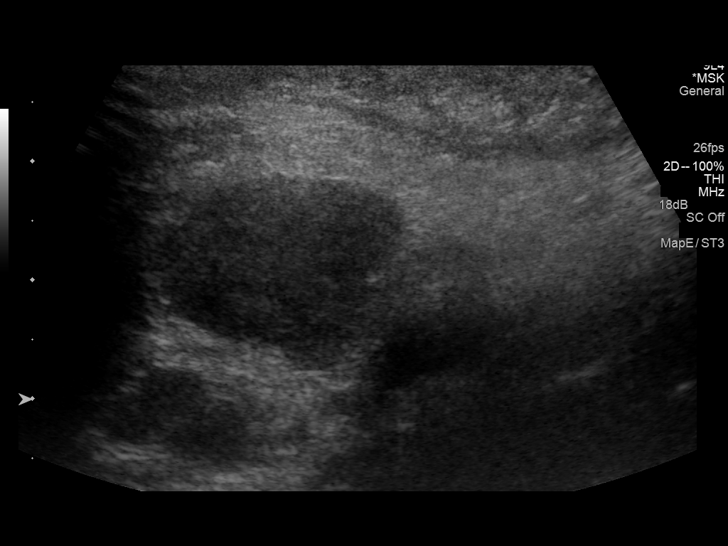

[11 of 11 positions shown; findings below may reference images not displayed]

PROCEDURE:
The procedure, risks, benefits, and alternatives were explained to
the patient. Questions regarding the procedure were encouraged and
answered. The patient understands and consents to the procedure.

The right neck was prepped with Betadine in a sterile fashion, and a
sterile drape was applied covering the operative field. A sterile
gown and sterile gloves were used for the procedure. A time-out
procedure was performed. Local anesthesia was provided with 1%
Lidocaine.

Ultrasound was performed of the right upper neck. After localizing
an enlarged lymph node, an 18 gauge core needle biopsy device was
utilized in performing core biopsy. A total of 5 samples were
obtained and submitted in saline for histologic analysis. Post
biopsy imaging was performed by ultrasound.

COMPLICATIONS:
None.
FINDINGS: Lobulated and heterogeneous appearing enlarged right upper cervical
lymph node corresponds to the partially necrotic appearing level II
node by CT. This lymph node measures approximately 2.4 cm in
greatest diameter by ultrasound. Solid tissue was obtained from the
lymph node.
IMPRESSION: Ultrasound-guided core biopsy performed of enlarged right upper
cervical lymph node.

## 2014-12-25 IMAGING — XA IR FLUORO GUIDE CV LINE*R*
1 series · 12 of 16 positions shown · non-contrast
Comparison: none

CLINICAL DATA: HEAD AND NECK CANCER (LARYNGEAL CANCER WITH
METASTATIC CERVICAL LYMPH NODES

[Series 1: run · 12 of 16 slices shown]
[im 1/16]
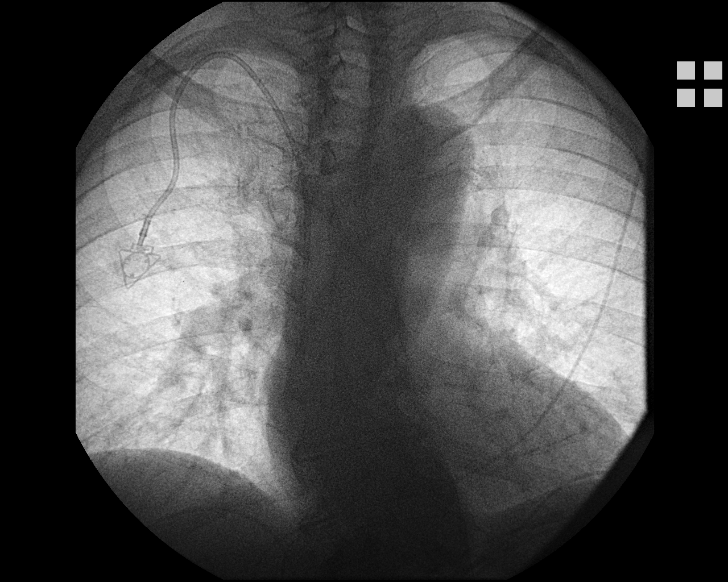
[im 3/16]
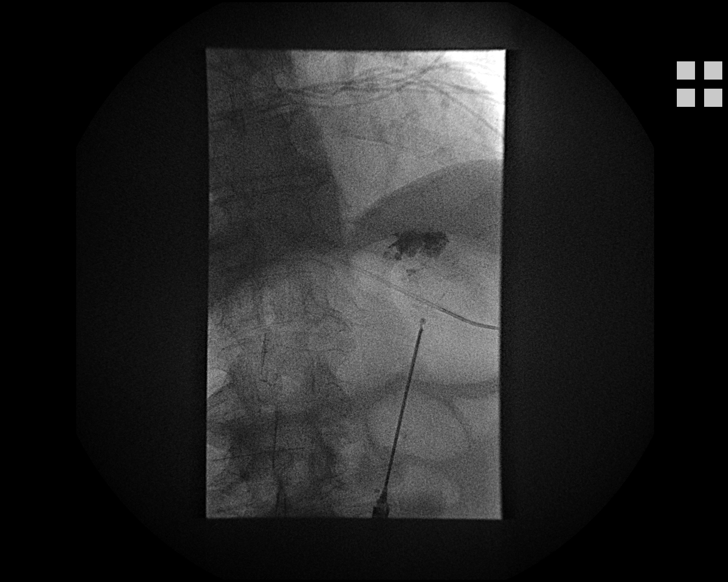
[im 4/16]
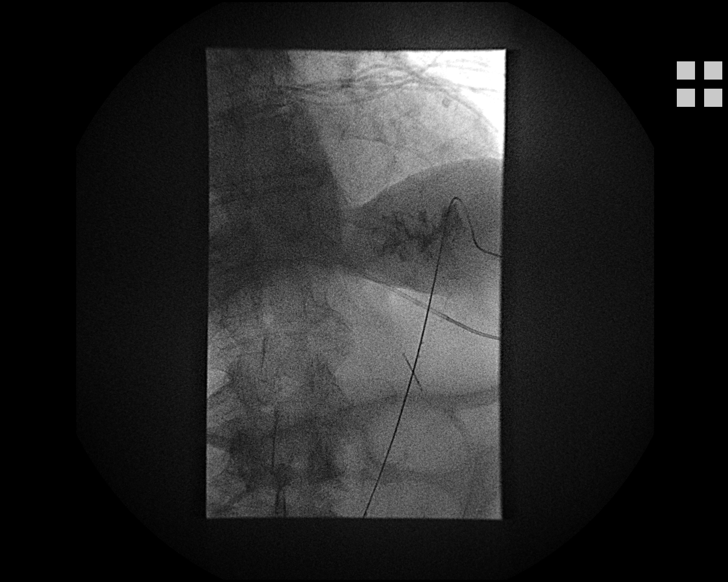
[im 5/16]
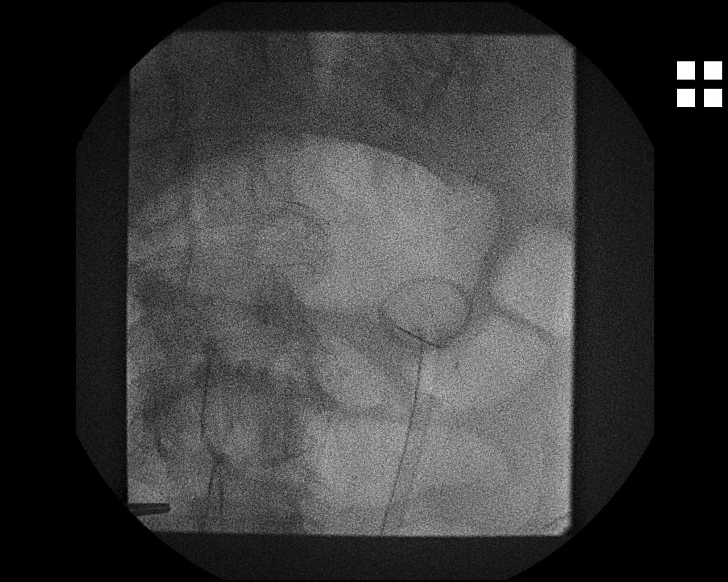
[im 7/16]
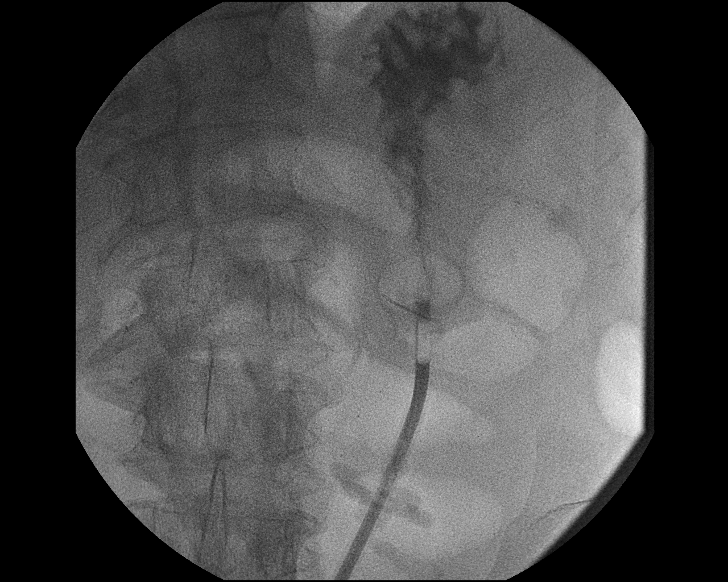
[im 8/16]
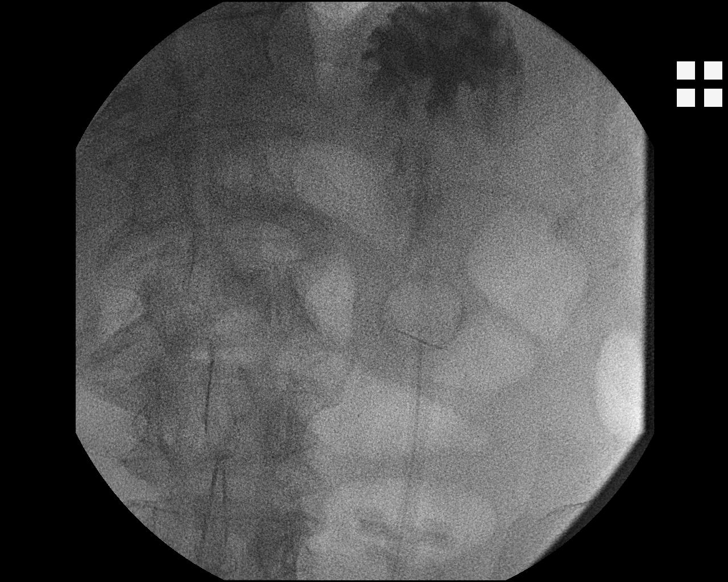
[im 9/16]
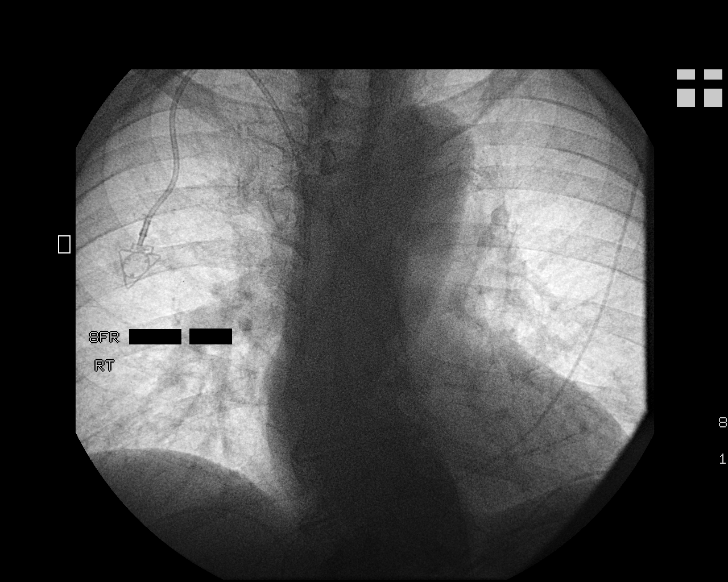
[im 11/16]
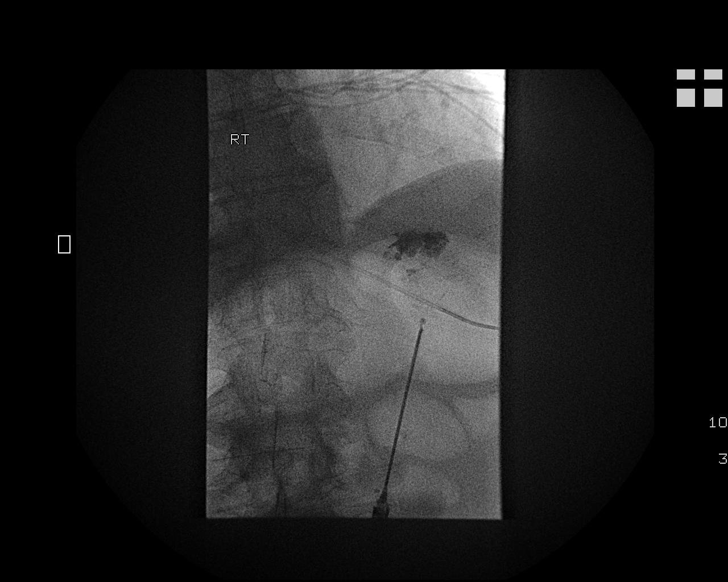
[im 12/16]
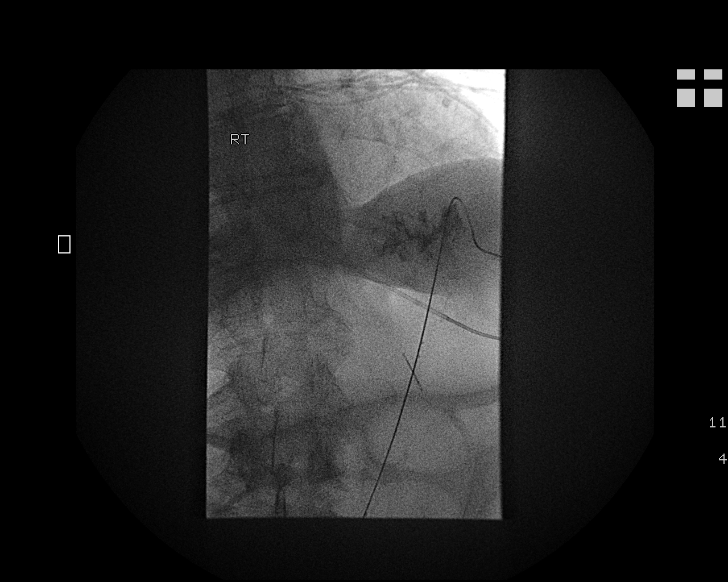
[im 13/16]
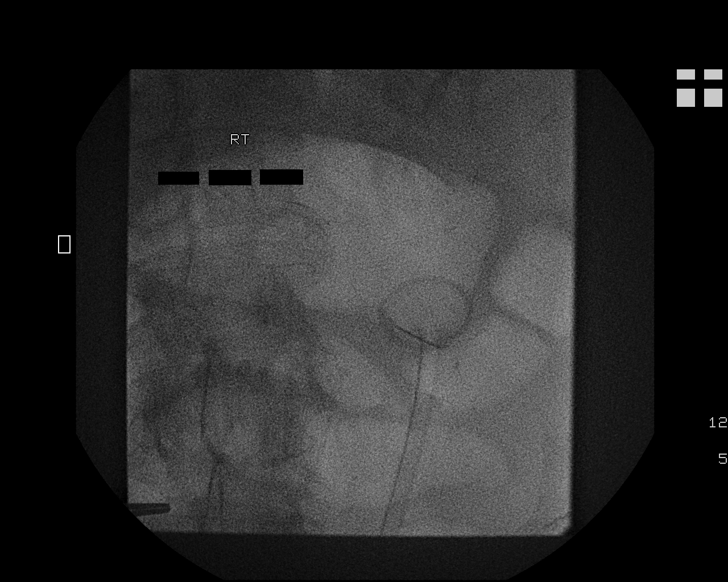
[im 15/16]
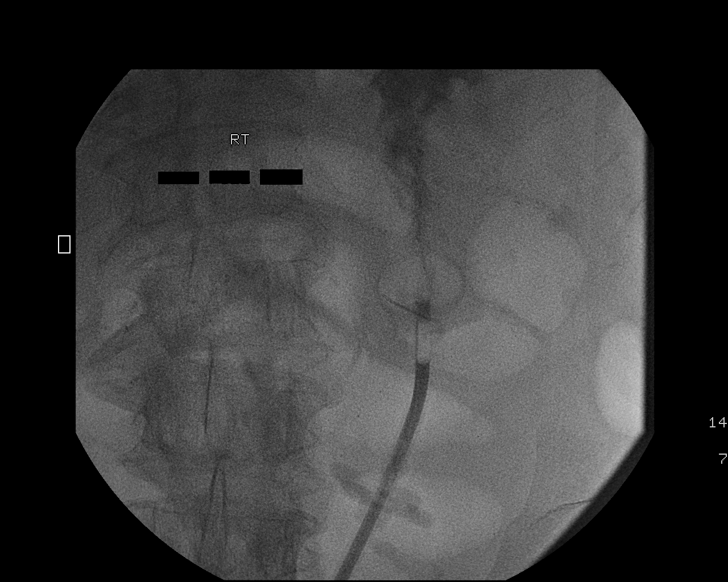
[im 16/16]
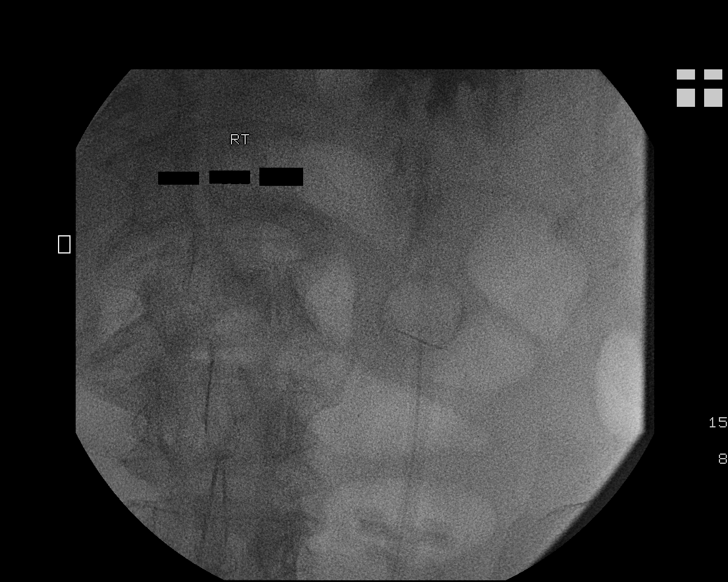

[12 of 16 positions shown; findings below may reference images not displayed]

EXAM:
RIGHT INTERNAL JUGULAR SINGLE LUMEN POWER PORT CATHETER INSERTION

Date:  [DATE] [DATE]

Radiologist:  Bastiene, Randvel

Guidance:  Ultrasound and fluoroscopic

FLUOROSCOPY TIME:  5 min 24 seconds

MEDICATIONS AND MEDICAL HISTORY:
1 g vancomycin and 400 mg Ciproadministered within 1 hour of the
procedure.1.5 mg versed, 75 mcg fentanyl

ANESTHESIA/SEDATION:
60 min

CONTRAST:  20mL OMNIPAQUE IOHEXOL 300 MG/ML  SOLN

COMPLICATIONS:
None immediate

PROCEDURE:
Informed consent was obtained from the patient following explanation
of the procedure, risks, benefits and alternatives. The patient
understands, agrees and consents for the procedure. All questions
were addressed. A time out was performed.

Maximal barrier sterile technique utilized including caps, mask,
sterile gowns, sterile gloves, large sterile drape, hand hygiene,
and 2% chlorhexidine scrub.

Under sterile conditions and local anesthesia, right internal
jugular micropuncture venous access was performed. Access was
performed with ultrasound. Images were obtained for documentation. A
guide wire was inserted followed by a transitional dilator. This
allowed insertion of a guide wire and catheter into the IVC.
Measurements were obtained from the SVC / RA junction back to the
right IJ venotomy site. In the right infraclavicular chest, a
subcutaneous pocket was created over the second anterior rib. This
was done under sterile conditions and local anesthesia. 1% lidocaine
with epinephrine was utilized for this. A 2.5 cm incision was made
in the skin. Blunt dissection was performed to create a subcutaneous
pocket over the right pectoralis major muscle. The pocket was
flushed with saline vigorously. There was adequate hemostasis. The
port catheter was assembled and checked for leakage. The port
catheter was secured in the pocket with two retention sutures. The
tubing was tunneled subcutaneously to the right venotomy site and
inserted into the SVC/RA junction through a valved peel-away sheath.
Position was confirmed with fluoroscopy. Images were obtained for
documentation. The patient tolerated the procedure well. No
immediate complications. Incisions were closed in a two layer
fashion with 4 - 0 Vicryl suture. Dermabond was applied to the skin.
The port catheter was accessed, blood was aspirated followed by
saline and heparin flushes. Needle was removed. A dry sterile
dressing was applied.
IMPRESSION: Ultrasound and fluoroscopically guided right internal jugular single
lumen power port catheter insertion. Tip in the SVC/RA junction.
Catheter ready for use.

## 2020-07-10 ENCOUNTER — Encounter: Payer: Self-pay | Admitting: Dermatology
